# Patient Record
Sex: Female | Born: 1935 | Race: White | Hispanic: No | Marital: Single | Smoking: Never smoker
Health system: Southern US, Community
[De-identification: ages and names within clinical notes are randomized; demographics above are authoritative.]

## PROBLEM LIST (undated history)

## (undated) DIAGNOSIS — M6740 Ganglion, unspecified site: Secondary | ICD-10-CM

## (undated) DIAGNOSIS — I519 Heart disease, unspecified: Secondary | ICD-10-CM

## (undated) DIAGNOSIS — M8580 Other specified disorders of bone density and structure, unspecified site: Secondary | ICD-10-CM

## (undated) DIAGNOSIS — N390 Urinary tract infection, site not specified: Secondary | ICD-10-CM

## (undated) DIAGNOSIS — E039 Hypothyroidism, unspecified: Secondary | ICD-10-CM

## (undated) DIAGNOSIS — I10 Essential (primary) hypertension: Secondary | ICD-10-CM

## (undated) DIAGNOSIS — M810 Age-related osteoporosis without current pathological fracture: Secondary | ICD-10-CM

## (undated) DIAGNOSIS — I4891 Unspecified atrial fibrillation: Secondary | ICD-10-CM

## (undated) DIAGNOSIS — H409 Unspecified glaucoma: Secondary | ICD-10-CM

## (undated) DIAGNOSIS — Z9889 Other specified postprocedural states: Secondary | ICD-10-CM

## (undated) DIAGNOSIS — R112 Nausea with vomiting, unspecified: Secondary | ICD-10-CM

## (undated) DIAGNOSIS — K219 Gastro-esophageal reflux disease without esophagitis: Secondary | ICD-10-CM

## (undated) DIAGNOSIS — L03012 Cellulitis of left finger: Secondary | ICD-10-CM

## (undated) DIAGNOSIS — R0602 Shortness of breath: Secondary | ICD-10-CM

## (undated) DIAGNOSIS — Z5189 Encounter for other specified aftercare: Secondary | ICD-10-CM

## (undated) DIAGNOSIS — I499 Cardiac arrhythmia, unspecified: Secondary | ICD-10-CM

## (undated) DIAGNOSIS — T8859XA Other complications of anesthesia, initial encounter: Secondary | ICD-10-CM

## (undated) DIAGNOSIS — M790 Rheumatism, unspecified: Secondary | ICD-10-CM

## (undated) DIAGNOSIS — K449 Diaphragmatic hernia without obstruction or gangrene: Secondary | ICD-10-CM

## (undated) DIAGNOSIS — M1990 Unspecified osteoarthritis, unspecified site: Secondary | ICD-10-CM

## (undated) DIAGNOSIS — E785 Hyperlipidemia, unspecified: Secondary | ICD-10-CM

## (undated) DIAGNOSIS — T4145XA Adverse effect of unspecified anesthetic, initial encounter: Secondary | ICD-10-CM

## (undated) DIAGNOSIS — K274 Chronic or unspecified peptic ulcer, site unspecified, with hemorrhage: Secondary | ICD-10-CM

## (undated) DIAGNOSIS — K862 Cyst of pancreas: Secondary | ICD-10-CM

## (undated) DIAGNOSIS — D649 Anemia, unspecified: Secondary | ICD-10-CM

## (undated) DIAGNOSIS — D509 Iron deficiency anemia, unspecified: Secondary | ICD-10-CM

## (undated) DIAGNOSIS — F419 Anxiety disorder, unspecified: Secondary | ICD-10-CM

## (undated) DIAGNOSIS — M722 Plantar fascial fibromatosis: Secondary | ICD-10-CM

## (undated) DIAGNOSIS — K2270 Barrett's esophagus without dysplasia: Secondary | ICD-10-CM

## (undated) DIAGNOSIS — L989 Disorder of the skin and subcutaneous tissue, unspecified: Secondary | ICD-10-CM

## (undated) DIAGNOSIS — K648 Other hemorrhoids: Secondary | ICD-10-CM

## (undated) DIAGNOSIS — R5383 Other fatigue: Secondary | ICD-10-CM

## (undated) HISTORY — PX: COLONOSCOPY: SHX174

## (undated) HISTORY — DX: Chronic or unspecified peptic ulcer, site unspecified, with hemorrhage: K27.4

## (undated) HISTORY — DX: Disorder of the skin and subcutaneous tissue, unspecified: L98.9

## (undated) HISTORY — DX: Anemia, unspecified: D64.9

## (undated) HISTORY — DX: Other hemorrhoids: K64.8

## (undated) HISTORY — DX: Unspecified atrial fibrillation: I48.91

## (undated) HISTORY — DX: Shortness of breath: R06.02

## (undated) HISTORY — DX: Urinary tract infection, site not specified: N39.0

## (undated) HISTORY — DX: Unspecified osteoarthritis, unspecified site: M19.90

## (undated) HISTORY — DX: Hyperlipidemia, unspecified: E78.5

## (undated) HISTORY — DX: Cellulitis of left finger: L03.012

## (undated) HISTORY — DX: Encounter for other specified aftercare: Z51.89

## (undated) HISTORY — DX: Rheumatism, unspecified: M79.0

## (undated) HISTORY — PX: OTHER SURGICAL HISTORY: SHX169

## (undated) HISTORY — DX: Other specified disorders of bone density and structure, unspecified site: M85.80

## (undated) HISTORY — DX: Hypothyroidism, unspecified: E03.9

## (undated) HISTORY — DX: Gastro-esophageal reflux disease without esophagitis: K21.9

## (undated) HISTORY — PX: UPPER GASTROINTESTINAL ENDOSCOPY: SHX188

## (undated) HISTORY — DX: Ganglion, unspecified site: M67.40

## (undated) HISTORY — DX: Cyst of pancreas: K86.2

## (undated) HISTORY — DX: Barrett's esophagus without dysplasia: K22.70

## (undated) HISTORY — DX: Plantar fascial fibromatosis: M72.2

## (undated) HISTORY — DX: Other fatigue: R53.83

## (undated) HISTORY — DX: Unspecified glaucoma: H40.9

## (undated) HISTORY — DX: Essential (primary) hypertension: I10

## (undated) HISTORY — DX: Anxiety disorder, unspecified: F41.9

## (undated) HISTORY — DX: Diaphragmatic hernia without obstruction or gangrene: K44.9

## (undated) HISTORY — DX: Heart disease, unspecified: I51.9

## (undated) HISTORY — PX: CATARACT EXTRACTION: SUR2

## (undated) HISTORY — DX: Iron deficiency anemia, unspecified: D50.9

## (undated) HISTORY — DX: Age-related osteoporosis without current pathological fracture: M81.0

---
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
## 1982-02-22 HISTORY — PX: ABDOMINAL HYSTERECTOMY: SHX81

## 2005-02-22 HISTORY — PX: APPENDECTOMY: SHX54

## 2005-02-22 HISTORY — PX: CHOLECYSTECTOMY: SHX55

## 2008-10-19 ENCOUNTER — Emergency Department (HOSPITAL_COMMUNITY): Admission: EM | Admit: 2008-10-19 | Discharge: 2008-10-19 | Payer: Self-pay | Admitting: Emergency Medicine

## 2010-05-30 LAB — URINALYSIS, ROUTINE W REFLEX MICROSCOPIC
Bilirubin Urine: NEGATIVE
Hgb urine dipstick: NEGATIVE
Specific Gravity, Urine: 1.01 (ref 1.005–1.030)
pH: 6.5 (ref 5.0–8.0)

## 2010-05-30 LAB — CBC
HCT: 40.1 % (ref 36.0–46.0)
Hemoglobin: 14 g/dL (ref 12.0–15.0)
MCHC: 34.8 g/dL (ref 30.0–36.0)
MCV: 92.6 fL (ref 78.0–100.0)
RBC: 4.32 MIL/uL (ref 3.87–5.11)

## 2010-05-30 LAB — DIFFERENTIAL
Eosinophils Absolute: 0.1 10*3/uL (ref 0.0–0.7)
Lymphs Abs: 1.2 10*3/uL (ref 0.7–4.0)
Neutrophils Relative %: 69 % (ref 43–77)

## 2010-05-30 LAB — COMPREHENSIVE METABOLIC PANEL
ALT: 21 U/L (ref 0–35)
CO2: 30 mEq/L (ref 19–32)
Calcium: 9.2 mg/dL (ref 8.4–10.5)
Creatinine, Ser: 0.68 mg/dL (ref 0.4–1.2)
GFR calc non Af Amer: >60 mL/min (ref >60)
Glucose, Bld: 75 mg/dL (ref 70–99)
Sodium: 137 mEq/L (ref 135–145)

## 2010-05-30 LAB — LIPASE, BLOOD: Lipase: 25 U/L (ref 11–59)

## 2014-05-08 ENCOUNTER — Encounter: Payer: Self-pay | Admitting: Gastroenterology

## 2014-05-14 ENCOUNTER — Telehealth: Payer: Self-pay | Admitting: *Deleted

## 2014-05-14 ENCOUNTER — Encounter: Payer: Self-pay | Admitting: Gastroenterology

## 2014-05-14 ENCOUNTER — Ambulatory Visit (INDEPENDENT_AMBULATORY_CARE_PROVIDER_SITE_OTHER): Payer: Medicare Other | Admitting: Gastroenterology

## 2014-05-14 VITALS — BP 110/76 | HR 70 | Resp 16 | Ht 63.58 in | Wt 126.6 lb

## 2014-05-14 DIAGNOSIS — D509 Iron deficiency anemia, unspecified: Secondary | ICD-10-CM | POA: Diagnosis not present

## 2014-05-14 MED ORDER — MOVIPREP 100 G PO SOLR: 1.0000 | Freq: Once | ORAL | Status: DC

## 2014-05-14 NOTE — Telephone Encounter (Signed)
   05/14/2014   RE: Megan Mercer DOB: June 06, 1935 MRN: [id number]   Dear Dr. Gibson Ramp,    We have scheduled the above patient for an endoscopic procedure. Our records show that she is on anticoagulation therapy.   Please advise if patient come off her therapy of Eliquis for 2 days prior to the procedure, which is scheduled for 07-09-14.  Please fax back/ or route the completed form to [facility] at [phone].   Sincerely,    Gerlean Ren

## 2014-05-14 NOTE — Telephone Encounter (Signed)
Faxed Letter over to [phone]

## 2014-05-14 NOTE — Progress Notes (Addendum)
05/14/2014 Megan Mercer [id number] 03-28-1935   HISTORY OF PRESENT ILLNESS:  This is a 79 year old female who was referred here by her PCP, Dr. Harmon Pier, and heme/onc for evaluation regarding iron deficiency anemia.  She is from [place], [place] and used to follow with GI there, Dr. West Carbo, however, he is now retired so she was sent here.  Most recent Hgb was 10.0 grams.  MCV low at 74.  Iron low at 25 and ferritin low at 5.  Vitamin B12 is normal at 559 and folate is > 20.  She actually had a negative IFOBT test and three negative hemoccult cards.  She was placed on iron supplements BID by her PCP last week but has only been taking it once a day because she has underlying constipation anyway and did not want to make it worse acutely because she is traveling out of the country soon.  She denies ever seeing blood in her stools.  Denies any dark/black stools; says that they are slightly darker now that she is on the iron, but nothing previous to that.  She denies any diarrhea and says that she actually has constipation, sometimes only having a BM once a week.  She says that she tried Miralax previously but was taking it 3 times a day at the recommendation of a friend and that caused her to have too many loose stools.  She is on Eliquis for PAF and is followed by cardiologist, Dr. Carrolyn Leigh in [place], [place].    Her last colonoscopy was in 12/2006 at which time the study was essentially normal, however, biopsies were obtained to rule out microscopic colitis and showed mild non-specific chronic inflammation.  Repeat colonoscopy was recommended in 10 years from that time.    Her last EGD was in 10/2011 at which time she was found to have a small hiatal hernia with erosions and possible short-segment Barrett's, antral gastritis, and redness in the second portion of the duodenum.  Gastric biopsies showed chronic gastropathy, duodenal biopsies showed mucosa with mild focal villous blunting.  Also had  biopsies of the distal esophagus that showed "benign fragments of mildly chronically inflamed gastric mucosa".    EGD in 10/2008 showed possible short segment Barrett's, diffuse gastritis, and patchy redness in the second portion of the duodenum.  Esophageal biopsies at that time showed Barrett's esophagus without dysplasia, gastric biopsies showed chronic gastropathy with minimal inflammation, and duodenal biopsies showed mild chronic duodenitis.   Past Medical History  Diagnosis Date  . Arthritis   . Atrial fibrillation   . Barrett's esophagus   . GERD (gastroesophageal reflux disease)   . HLD (hyperlipidemia)   . HTN (hypertension)   . Hypothyroidism   . UTI (urinary tract infection)   . Peptic ulcer disease with hemorrhage    Past Surgical History  Procedure Laterality Date  . Appendectomy  2007  . Cholecystectomy  2007  . Abdominal hysterectomy  1984    reports that she has never smoked. She has never used smokeless tobacco. She reports that she drinks about 0.6 oz of alcohol per week. She reports that she does not use illicit drugs. family history includes Cancer in her father; Diabetes in her sister; Heart failure in her sister; Hyperlipidemia in her sister; Rheum arthritis in her father, mother, and sister. Allergies  Allergen Reactions  . Codeine Nausea And Vomiting  . Sulfa Antibiotics Nausea And Vomiting      Outpatient Encounter Prescriptions as of 05/14/2014  Medication Sig  . apixaban (ELIQUIS) 5 MG TABS tablet Take 5 mg by mouth 2 (two) times daily.  . Calcium Carbonate 1500 (600 CA) MG TABS Take 1 tablet by mouth as needed.  . colestipol (COLESTID) 1 G tablet Take 1 g by mouth 2 (two) times daily.  Marland Kitchen diltiazem (TIAZAC) 240 MG 24 hr capsule Take 240 mg by mouth daily.  Marland Kitchen esomeprazole (NEXIUM) 40 MG capsule Take 40 mg by mouth daily.  Marland Kitchen ezetimibe (ZETIA) 10 MG tablet Take 10 mg by mouth daily.  Marland Kitchen FOLIC ACID PO Take 038 mg by mouth as needed.  Marland Kitchen levothyroxine  (SYNTHROID, LEVOTHROID) 75 MCG tablet Take 75 mcg by mouth daily before breakfast.  . propafenone (RYTHMOL) 225 MG tablet Take 225 mg by mouth 2 (two) times daily.  . raloxifene (EVISTA) 60 MG tablet Take 60 mg by mouth daily.  . ranitidine (ZANTAC) 300 MG tablet Take 300 mg by mouth at bedtime.  . travoprost, benzalkonium, (TRAVATAN) 0.004 % ophthalmic solution Place 1 drop into both eyes at bedtime.  Marland Kitchen VITAMIN D, CHOLECALCIFEROL, PO Take 1,200 mg by mouth as needed.  Marland Kitchen MOVIPREP 100 G SOLR Take 1 kit (200 g total) by mouth once.     REVIEW OF SYSTEMS  : All other systems reviewed and negative except where noted in the History of Present Illness.   PHYSICAL EXAM: BP 110/76 mmHg  Pulse 70  Resp 16  Ht 5' 3.58" (1.615 m)  Wt 126 lb 9.6 oz (57.425 kg)  BMI 22.02 kg/m2 General: Well developed white female in no acute distress Head: Normocephalic and atraumatic Eyes: Sclerae anicteric, conjunctiva pink. Ears: Normal auditory acuity Lungs: Clear throughout to auscultation Heart: Regular rate and rhythm Abdomen: Soft, non-distended.  Normal bowel sounds.  Mild epigastric TTP without R/R/G. Rectal:  Will be done at the time of colonoscopy.  IFOBT negative. Musculoskeletal: Symmetrical with no gross deformities  Skin: No lesions on visible extremities Extremities: No edema  Neurological: Alert oriented x 4, grossly non-focal Psychological:  Alert and cooperative. Normal mood and affect  ASSESSMENT AND PLAN: -Iron deficiency anemia:  Hgb 10.0 grams most recently with low MCV.  IFOBT negative and hemoccult cards negative x 3.  Last colonoscopy was 12/2006 and last EGD 10/2011.  Has history of Barrett's from biopsies on EGD in 2010.  Will schedule EGD and colonoscopy with Dr. Hilarie Fredrickson.  Will give two day bowel prep due to underlying constipation issues.  Should have repeat duodenal biopsies for celiac and may need celiac labs performed as well.  She will continue her iron supplements per  recommendations from heme/onc.   -Chronic anticoagulation, on Eliquis for atrial fibrillation -Constipation:  Will try Miralax once or twice daily.  *Will hold Eliquis for 2 days prior to endoscopic procedures - will instruct when and how to resume after procedure. Benefits and risks of procedure explained including risks of bleeding, perforation, infection, missed lesions, reactions to medications and possible need for hospitalization and surgery for complications. Additional rare but real risk of stroke or other vascular clotting events off of Eliquis also explained and need to seek urgent help if any signs of these problems occur. Will communicate by phone or EMR with patient's prescribing provider, Dr. Carrolyn Leigh, cardiology to confirm that holding Eliquis is reasonable in this case.   CC:  Dr. Harmon Pier  Addendum: Reviewed and agree with initial management. Jerene Bears, MD

## 2014-05-14 NOTE — Patient Instructions (Signed)
You have been scheduled for an endoscopy and colonoscopy. Please follow the written instructions given to you at your visit today. Please pick up your prep supplies at the pharmacy within the next 1-3 days. If you use inhalers (even only as needed), please bring them with you on the day of your procedure. Your physician has requested that you go to www.[email or web address].com and enter the access code given to you at your visit today. This web site gives a general overview about your procedure. However, you should still follow specific instructions given to you by our office regarding your preparation for the procedure.  Please purchase the following medications over the counter and take as directed: Miralax once or twice daily

## 2014-05-20 NOTE — Telephone Encounter (Signed)
I have spoken to patient to advise that per Dr Real Cons faxed response, she may hold her Eliquis 2 days prior to her scheduled procedure on 07/09/14. She verbalizes understanding.

## 2014-07-09 ENCOUNTER — Other Ambulatory Visit: Payer: Self-pay | Admitting: Internal Medicine

## 2014-07-09 ENCOUNTER — Encounter: Payer: Self-pay | Admitting: Internal Medicine

## 2014-07-09 ENCOUNTER — Ambulatory Visit (AMBULATORY_SURGERY_CENTER): Payer: Medicare Other | Admitting: Internal Medicine

## 2014-07-09 VITALS — BP 135/67 | HR 62 | Temp 97.3°F | Resp 34 | Ht 63.0 in | Wt 126.0 lb

## 2014-07-09 DIAGNOSIS — D509 Iron deficiency anemia, unspecified: Secondary | ICD-10-CM | POA: Diagnosis not present

## 2014-07-09 DIAGNOSIS — Z8719 Personal history of other diseases of the digestive system: Secondary | ICD-10-CM

## 2014-07-09 MED ORDER — SODIUM CHLORIDE 0.9 % IV SOLN: 500.0000 mL | INTRAVENOUS | Status: DC

## 2014-07-09 MED ORDER — LUBIPROSTONE 8 MCG PO CAPS: 8.0000 ug | ORAL_CAPSULE | Freq: Two times a day (BID) | ORAL | Status: DC

## 2014-07-09 NOTE — Progress Notes (Signed)
To recovery, report to McCoy, RN, VSS 

## 2014-07-09 NOTE — Op Note (Signed)
[place]  [address]. [place], [address]   ENDOSCOPY PROCEDURE REPORT  PATIENT: Megan Mercer, Megan Mercer  MR#: [id number] BIRTHDATE: Jan 05, 1936 , 79  yrs. old GENDER: female ENDOSCOPIST: Jerene Bears, MD REFERRED BY:  Clinton Quant, M.D. PROCEDURE DATE:  07/09/2014 PROCEDURE:  EGD w/ biopsy ASA CLASS:     Class III INDICATIONS:  iron deficiency anemia and history of Barrett's esophagus. MEDICATIONS: Monitored anesthesia care and Propofol 150 mg IV TOPICAL ANESTHETIC: none  DESCRIPTION OF PROCEDURE: After the risks benefits and alternatives of the procedure were thoroughly explained, informed consent was obtained.  The LB [id number] endoscope was introduced through the mouth and advanced to the second portion of the duodenum , Without limitations.  The instrument was slowly withdrawn as the mucosa was fully examined.  ESOPHAGUS: There was possible short segment Barrett's esophagus found at the gastroesophageal junction (1 broad based tongue measuring 1 cm above the JGEJ).  There was no nodular mucosa noted in the Barrett's segment.  Multiple biopsies were performed using cold forceps.   Otherwise normal esophagus.  STOMACH: Small hiatal hernia with no Cameron's lesions. The mucosa of the stomach appeared normal.  DUODENUM: The duodenal mucosa showed no abnormalities in the bulb and 2nd part of the duodenum.  Cold forceps biopsies were taken in the bulb and second portion.  Retroflexed views revealed as above hiatal hernia.   The scope was then withdrawn from the patient and the procedure completed.  COMPLICATIONS: There were no immediate complications.  ENDOSCOPIC IMPRESSION: 1.   There was possible short segment Barrett's esophagus found at the gastroesophageal junction; multiple biopsies were performed. Small hiatal hernia 2.   The mucosa of the stomach appeared normal 3.   The duodenal mucosa showed no abnormalities in the bulb and  2nd part of the duodenum cold forceps biopsies were taken in the bulb and second portion  RECOMMENDATIONS: 1.  Await biopsy results 2.  Proceed with a Colonoscopy eSigned:  Jerene Bears, MD 07/09/2014 11:22 AM CC: the patient, Clinton Quant, M.D.

## 2014-07-09 NOTE — Op Note (Signed)
[place]  [address]. [place], [address]   COLONOSCOPY PROCEDURE REPORT  PATIENT: Megan Mercer, Megan Mercer  MR#: [id number] BIRTHDATE: Jan 08, 1936 , 79  yrs. old GENDER: female ENDOSCOPIST: Jerene Bears, MD REFERRED XB:JYNWGN Helyn App, M.D. PROCEDURE DATE:  07/09/2014 PROCEDURE:   Colonoscopy, diagnostic First Screening Colonoscopy - Avg.  risk and is 50 yrs.  old or older - No.  Prior Negative Screening - Now for repeat screening. N/A  History of Adenoma - Now for follow-up colonoscopy & has been > or = to 3 yrs.  N/A  Polyps removed today? No Recommend repeat exam, <10 yrs? No ASA CLASS:   Class III INDICATIONS:Unexplained iron deficiency anemia. MEDICATIONS: Monitored anesthesia care, Propofol 110 mg IV, and Residual sedation present  DESCRIPTION OF PROCEDURE:   After the risks benefits and alternatives of the procedure were thoroughly explained, informed consent was obtained.  The digital rectal exam revealed no abnormalities of the rectum.   The LB [id number]  endoscope was introduced through the anus and advanced to the cecum, which was identified by both the appendix and ileocecal valve. No adverse events experienced.   The quality of the prep was good, using MoviPrep  The instrument was then slowly withdrawn as the colon was fully examined.      COLON FINDINGS: A normal appearing cecum, ileocecal valve, and appendiceal orifice were identified.  the ascending, transverse, descending, sigmoid colon, and rectum appeared unremarkable. Retroflexed views revealed no abnormalities. The time to cecum = 3.9 Withdrawal time = 11.6   The scope was withdrawn and the procedure completed.  COMPLICATIONS: There were no complications.  ENDOSCOPIC IMPRESSION: Normal colonoscopy with no source found to explain iron deficiency anemia  RECOMMENDATIONS: 1.  Would recommend trial of Amitiza 8 mcg twice daily for chronic constipation 2.  Arrange video  capsule endoscopy to complete evaluation of iron deficiency anemia 3.  Would recommend IV iron with previously established hematologist   eSigned:  Jerene Bears, MD 07/09/2014 11:27 AM   cc: the patient, Clinton Quant, M.D.

## 2014-07-09 NOTE — Progress Notes (Signed)
Called to room to assist during endoscopic procedure.  Patient ID and intended procedure confirmed with present staff. Received instructions for my participation in the procedure from the performing physician.  

## 2014-07-09 NOTE — Patient Instructions (Addendum)
Discharge instructions given. Biopsies taken. Prescription sent into pharmacy. Office will arrange for capsule endoscopy. YOU HAD AN ENDOSCOPIC PROCEDURE TODAY AT [facility]:   Refer to the procedure report that was given to you for any specific questions about what was found during the examination.  If the procedure report does not answer your questions, please call your gastroenterologist to clarify.  If you requested that your care partner not be given the details of your procedure findings, then the procedure report has been included in a sealed envelope for you to review at your convenience later.  YOU SHOULD EXPECT: Some feelings of bloating in the abdomen. Passage of more gas than usual.  Walking can help get rid of the air that was put into your GI tract during the procedure and reduce the bloating. If you had a lower endoscopy (such as a colonoscopy or flexible sigmoidoscopy) you may notice spotting of blood in your stool or on the toilet paper. If you underwent a bowel prep for your procedure, you may not have a normal bowel movement for a few days.  Please Note:  You might notice some irritation and congestion in your nose or some drainage.  This is from the oxygen used during your procedure.  There is no need for concern and it should clear up in a day or so.  SYMPTOMS TO REPORT IMMEDIATELY:   Following lower endoscopy (colonoscopy or flexible sigmoidoscopy):  Excessive amounts of blood in the stool  Significant tenderness or worsening of abdominal pains  Swelling of the abdomen that is new, acute  Fever of 100F or higher   Following upper endoscopy (EGD)  Vomiting of blood or coffee ground material  New chest pain or pain under the shoulder blades  Painful or persistently difficult swallowing  New shortness of breath  Fever of 100F or higher  Black, tarry-looking stools  For urgent or emergent issues, a gastroenterologist can be reached at any hour by  calling [phone].   DIET: Your first meal following the procedure should be a small meal and then it is ok to progress to your normal diet. Heavy or fried foods are harder to digest and may make you feel nauseous or bloated.  Likewise, meals heavy in dairy and vegetables can increase bloating.  Drink plenty of fluids but you should avoid alcoholic beverages for 24 hours.  ACTIVITY:  You should plan to take it easy for the rest of today and you should NOT DRIVE or use heavy machinery until tomorrow (because of the sedation medicines used during the test).    FOLLOW UP: Our staff will call the number listed on your records the next business day following your procedure to check on you and address any questions or concerns that you may have regarding the information given to you following your procedure. If we do not reach you, we will leave a message.  However, if you are feeling well and you are not experiencing any problems, there is no need to return our call.  We will assume that you have returned to your regular daily activities without incident.  If any biopsies were taken you will be contacted by phone or by letter within the next 1-3 weeks.  Please call us at [phone] if you have not heard about the biopsies in 3 weeks.    SIGNATURES/CONFIDENTIALITY: You and/or your care partner have signed paperwork which will be entered into your electronic medical record.  These signatures attest to  the fact that that the information above on your After Visit Summary has been reviewed and is understood.  Full responsibility of the confidentiality of this discharge information lies with you and/or your care-partner.

## 2014-07-10 ENCOUNTER — Telehealth: Payer: Self-pay | Admitting: *Deleted

## 2014-07-10 ENCOUNTER — Other Ambulatory Visit: Payer: Self-pay

## 2014-07-10 DIAGNOSIS — D509 Iron deficiency anemia, unspecified: Secondary | ICD-10-CM

## 2014-07-10 NOTE — Telephone Encounter (Signed)
  Follow up Call-  Call back number 07/09/2014  Post procedure Call Back phone  # [phone]  Permission to leave phone message Yes     Patient questions:  Do you have a fever, pain , or abdominal swelling? No. Pain Score  0 *  Have you tolerated food without any problems? Yes.    Have you been able to return to your normal activities? Yes.    Do you have any questions about your discharge instructions: Diet   No. Medications  No. Follow up visit  No.  Do you have questions or concerns about your Care? No.  Actions: * If pain score is 4 or above: No action needed, pain <4.

## 2014-07-16 ENCOUNTER — Telehealth: Payer: Self-pay | Admitting: Internal Medicine

## 2014-07-16 ENCOUNTER — Encounter: Payer: Self-pay | Admitting: Internal Medicine

## 2014-07-16 NOTE — Telephone Encounter (Signed)
No Barrett's esophagus from esophageal biopsies Small bowel normal, no celiac disease or increased inflammation Would proceed with small bowel video capsule endoscopy to complete workup of iron deficiency anemia Letter sent

## 2014-07-16 NOTE — Telephone Encounter (Signed)
Pt calling for path results, please advise

## 2014-07-18 ENCOUNTER — Telehealth: Payer: Self-pay | Admitting: Internal Medicine

## 2014-07-18 NOTE — Telephone Encounter (Signed)
Reviewed schedule with pt and after looking at dates she has decided to call us back later in July to schedule VCE.

## 2014-07-18 NOTE — Telephone Encounter (Signed)
Left message for pt to call back  °

## 2014-07-18 NOTE — Telephone Encounter (Signed)
Spoke with pt and she is aware of results. Pt is willing to have VCE done and would like to call back the middle of June after some graduations and a beach trip to schedule. States she has decided to schedule the iron infusions with her hematologist. Dr. Hilarie Fredrickson aware.

## 2014-07-29 ENCOUNTER — Telehealth: Payer: Self-pay | Admitting: Internal Medicine

## 2014-07-31 NOTE — Telephone Encounter (Signed)
Pt scheduled for capsule teaching 08/20/14@2pm . Scheduled for Capsule endo 09/24/14@8am , pt aware.

## 2014-08-15 ENCOUNTER — Telehealth: Payer: Self-pay | Admitting: Internal Medicine

## 2014-08-15 NOTE — Telephone Encounter (Signed)
Appt rescheduled to 09/17/14@8am , pt aware.

## 2014-08-22 ENCOUNTER — Telehealth: Payer: Self-pay | Admitting: Internal Medicine

## 2014-08-22 NOTE — Telephone Encounter (Signed)
Pts capsule endo moved to 11/18/14, pt aware.

## 2014-11-15 ENCOUNTER — Telehealth: Payer: Self-pay | Admitting: Internal Medicine

## 2014-11-15 NOTE — Telephone Encounter (Signed)
Beth spoke to Dr. Hilarie Fredrickson and patient does not have to hold her Eliquis. Patient notified of the recommendation and has no question or concerns at this time.

## 2014-11-18 ENCOUNTER — Telehealth: Payer: Self-pay

## 2014-11-18 ENCOUNTER — Ambulatory Visit (INDEPENDENT_AMBULATORY_CARE_PROVIDER_SITE_OTHER): Payer: Medicare Other | Admitting: Internal Medicine

## 2014-11-18 DIAGNOSIS — D509 Iron deficiency anemia, unspecified: Secondary | ICD-10-CM

## 2014-11-18 NOTE — Progress Notes (Signed)
Patient here for capsule endoscopy. Tolerated procedure. Verbalizes understanding of written and verbal instructions.  Capsule ID # [id number]  Lot [id number] Exp [id number] Lot [id number]

## 2014-11-18 NOTE — Telephone Encounter (Signed)
1) Can she take the Amitiza BID PRN or should it be taken regularly? 2) She has a long standing issue with fecal incontinence at times. This is even when she has had difficult bowel movements earlier in the day. She cannot pass gas with confidence. Any idea on the cause/cure for her incontinence? 3) She is without reflux or indigestion symptoms. Can she take Nexium PRN?

## 2014-11-19 NOTE — Telephone Encounter (Signed)
Amitiza is usually indicated for regular use but I'm okay if she uses this as needed Fecal incontinence is likely due to pelvic floor dysfunction, there is usually no easy cure. Pelvic floor physical therapy sometimes helps and can be considered if she is interested nexium is not effective as needed. She can use ranitidine 150 every 12 hours when necessary for breakthrough heartburn.  If this is the plan Nexium can be discontinued

## 2014-11-19 NOTE — Telephone Encounter (Signed)
Left message for patient to call back  

## 2014-11-19 NOTE — Telephone Encounter (Signed)
Patient notified of recommendations  She will call back if she wants to pursue physical therapy and pelvic floor training

## 2014-11-29 ENCOUNTER — Telehealth: Payer: Self-pay

## 2014-11-29 ENCOUNTER — Telehealth: Payer: Self-pay | Admitting: Internal Medicine

## 2014-11-29 DIAGNOSIS — T189XXA Foreign body of alimentary tract, part unspecified, initial encounter: Secondary | ICD-10-CM

## 2014-11-29 NOTE — Telephone Encounter (Signed)
-----   Message from Alfredia Ferguson, PA-C sent at 11/27/2014  5:48 PM EDT ----- Regarding: capsule Capsule ready for review   Vaughan Basta- study was incomplete- please call pt and if hasnt passed the capsule check a KUB

## 2014-11-29 NOTE — Telephone Encounter (Signed)
What did the part you did read show?

## 2014-11-29 NOTE — Telephone Encounter (Signed)
Left message for patient to call back  

## 2014-11-29 NOTE — Telephone Encounter (Signed)
Patient is not sure that she saw it.  She will come for KUB one day next week.   Orders placed.  She is notified that the x-ray department is closed until 12/04/14.  She is offered to go to [facility] or [facility].  She prefers to wait until x-ray here is running again.

## 2014-11-29 NOTE — Telephone Encounter (Signed)
Incomplete, needs KUB if didn't pass capsule... Multiple scattered  erosions in small bowel

## 2014-12-03 NOTE — Telephone Encounter (Signed)
Patient aware of results .  She will come one day this week for KUB

## 2014-12-09 ENCOUNTER — Encounter: Payer: Self-pay | Admitting: Internal Medicine

## 2014-12-09 ENCOUNTER — Telehealth: Payer: Self-pay | Admitting: Internal Medicine

## 2014-12-09 NOTE — Telephone Encounter (Signed)
Left message for pt to call back  °

## 2014-12-11 ENCOUNTER — Ambulatory Visit (INDEPENDENT_AMBULATORY_CARE_PROVIDER_SITE_OTHER)
Admission: RE | Admit: 2014-12-11 | Discharge: 2014-12-11 | Disposition: A | Discharge status: Home / Self Care | Payer: Medicare Other | Source: Ambulatory Visit | Attending: Physician Assistant | Admitting: Physician Assistant

## 2014-12-11 DIAGNOSIS — T189XXA Foreign body of alimentary tract, part unspecified, initial encounter: Secondary | ICD-10-CM

## 2014-12-11 NOTE — Telephone Encounter (Signed)
Spoke with pt and she is aware of capsule results.

## 2015-01-23 ENCOUNTER — Encounter: Payer: Self-pay | Admitting: *Deleted

## 2015-01-27 ENCOUNTER — Telehealth: Payer: Self-pay | Admitting: Internal Medicine

## 2015-01-27 NOTE — Telephone Encounter (Signed)
Explained to pt that the OV was a followup from her capsule endo. Pt states she sees her hematologist in early Jan and he is monitoring her labs. Pt states she will call our office back if she needs to be seen. Reports mailed to pt per her request.

## 2015-02-05 ENCOUNTER — Ambulatory Visit: Payer: Medicare Other | Admitting: Internal Medicine

## 2015-08-19 ENCOUNTER — Encounter: Payer: Self-pay | Admitting: Physician Assistant

## 2015-08-19 ENCOUNTER — Ambulatory Visit (INDEPENDENT_AMBULATORY_CARE_PROVIDER_SITE_OTHER): Payer: Medicare Other | Admitting: Physician Assistant

## 2015-08-19 ENCOUNTER — Other Ambulatory Visit (INDEPENDENT_AMBULATORY_CARE_PROVIDER_SITE_OTHER): Payer: Medicare Other

## 2015-08-19 ENCOUNTER — Telehealth: Payer: Self-pay | Admitting: *Deleted

## 2015-08-19 VITALS — BP 150/76 | HR 76 | Ht 63.0 in | Wt 123.4 lb

## 2015-08-19 DIAGNOSIS — R1013 Epigastric pain: Secondary | ICD-10-CM | POA: Diagnosis not present

## 2015-08-19 DIAGNOSIS — R634 Abnormal weight loss: Secondary | ICD-10-CM

## 2015-08-19 LAB — COMPREHENSIVE METABOLIC PANEL
ALBUMIN: 4.5 g/dL (ref 3.5–5.2)
ALT: 18 U/L (ref 0–35)
AST: 18 U/L (ref 0–37)
Alkaline Phosphatase: 67 U/L (ref 39–117)
BILIRUBIN TOTAL: 0.8 mg/dL (ref 0.2–1.2)
BUN: 11 mg/dL (ref 6–23)
CALCIUM: 9.5 mg/dL (ref 8.4–10.5)
CO2: 30 mEq/L (ref 19–32)
CREATININE: 0.72 mg/dL (ref 0.40–1.20)
Chloride: 97 mEq/L (ref 96–112)
GFR: 83.21 mL/min (ref >60.00)
Glucose, Bld: 91 mg/dL (ref 70–99)
Potassium: 4.1 mEq/L (ref 3.5–5.1)
Sodium: 132 mEq/L — ABNORMAL LOW (ref 135–145)
Total Protein: 7.5 g/dL (ref 6.0–8.3)

## 2015-08-19 LAB — CBC WITH DIFFERENTIAL/PLATELET
BASOS ABS: 0.1 10*3/uL (ref 0.0–0.1)
Basophils Relative: 1 % (ref 0.0–3.0)
Eosinophils Absolute: 0.1 10*3/uL (ref 0.0–0.7)
Eosinophils Relative: 1 % (ref 0.0–5.0)
HEMATOCRIT: 42.3 % (ref 36.0–46.0)
HEMOGLOBIN: 14.4 g/dL (ref 12.0–15.0)
LYMPHS PCT: 15.5 % (ref 12.0–46.0)
Lymphs Abs: 1.5 10*3/uL (ref 0.7–4.0)
MCHC: 34 g/dL (ref 30.0–36.0)
MCV: 93.4 fl (ref 78.0–100.0)
MONOS PCT: 8.5 % (ref 3.0–12.0)
Monocytes Absolute: 0.8 10*3/uL (ref 0.1–1.0)
Neutro Abs: 7.2 10*3/uL (ref 1.4–7.7)
Neutrophils Relative %: 74 % (ref 43.0–77.0)
Platelets: 519 10*3/uL — ABNORMAL HIGH (ref 150.0–400.0)
RBC: 4.53 Mil/uL (ref 3.87–5.11)
RDW: 14 % (ref 11.5–15.5)
WBC: 9.7 10*3/uL (ref 4.0–10.5)

## 2015-08-19 LAB — LIPASE: Lipase: 23 U/L (ref 11.0–59.0)

## 2015-08-19 MED ORDER — SUCRALFATE 1 GM/10ML PO SUSP: 1.0000 g | Freq: Three times a day (TID) | ORAL | Status: DC

## 2015-08-19 MED ORDER — RANITIDINE HCL 300 MG PO TABS: 300.0000 mg | ORAL_TABLET | Freq: Every day | ORAL | Status: DC

## 2015-08-19 NOTE — Progress Notes (Addendum)
Patient ID: Megan Mercer, female   DOB: May 13, 1935, 80 y.o.   MRN: [id number]   Subjective:    Patient ID: Megan Mercer, female    DOB: November 06, 1935, 80 y.o.   MRN: [id number]  HPI Megan Mercer is a pleasant 80 year old white female known to Dr. Hilarie Fredrickson from workup about a year ago for iron deficiency anemia. She comes in today with complaints of epigastric pain and weight loss. Patient does have history of osteoarthritis, atrial fibrillation for which she is on eliquis, hypertension hyperlipidemia and is status post cholecystectomy and appendectomy. Patient relates having an episode of severe epigastric and subxiphoid pain and pressure while she was away at the beach a couple of weeks ago. Her pain persisted and she developed very rapid heart rate. She was seen at an urgent care and then taken to [facility] at [facility]. She was admitted. She says she after a GI cocktail her pain persisted and even after morphine it persisted but gradually abated over several hours. She was seen by cardiology there and felt to have atrial fib flutter. Nuclear stress test was recommended and out was completed prior to discharge she was told this was negative. She is followed by a cardiologist in [place]. She has since been referred for possible ablation and is still thinking about that.  She hasn't had any episodes of tachycardia in the past week or so. He has been having ongoing GI symptoms however with intermittent subxiphoid and epigastric pain and pressure on a daily basis. She says this really doesn't ever go completely away. Not had any nausea or vomiting. Denies any dysphagia or odynophagia. She is not clear about whether eating seems to affect her symptoms are not but hasn't been eating very much and feels that her weight is down about 10 pounds over the past several weeks. She has been taking Nexium 40 mg by mouth every morning and a Zantac at bedtime. She denies any aspirin or NSAID  use. Workup in May 2016 with endoscopy and colonoscopy revealed a normal colon and in and no with possible Barrett's however biopsies were negative no evidence for gastritis etc. and small bowel biopsies also negative. She had subsequent capsule endoscopy in October 2016 with finding of a few scattered nonspecific erosions. She is anxious because of her recent cardiac issues.  Review of Systems Pertinent positive and negative review of systems were noted in the above HPI section.  All other review of systems was otherwise negative.  Outpatient Encounter Prescriptions as of 08/19/2015  Medication Sig  . apixaban (ELIQUIS) 5 MG TABS tablet Take 5 mg by mouth 2 (two) times daily.  . Calcium Carbonate 1500 (600 CA) MG TABS Take 1 tablet by mouth as needed.  . Cholecalciferol (VITAMIN D3) 1000 UNITS CAPS Take by mouth.  . colestipol (COLESTID) 1 G tablet Take 1 g by mouth 2 (two) times daily.  Marland Kitchen diltiazem (TIAZAC) 240 MG 24 hr capsule Take 240 mg by mouth daily.  Marland Kitchen esomeprazole (NEXIUM) 40 MG capsule Take 40 mg by mouth daily.  Marland Kitchen ezetimibe (ZETIA) 10 MG tablet Take 10 mg by mouth daily.  . ferrous sulfate 325 (65 FE) MG tablet Take 325 mg by mouth 2 (two) times daily with a meal.  . FOLIC ACID PO Take 0000000 mg by mouth as needed.  . latanoprost (XALATAN) 0.005 % ophthalmic solution   . levothyroxine (SYNTHROID, LEVOTHROID) 75 MCG tablet Take 75 mcg by mouth daily before breakfast.  . propafenone (RYTHMOL) 225  MG tablet Take 225 mg by mouth 2 (two) times daily.  . raloxifene (EVISTA) 60 MG tablet Take 60 mg by mouth daily.  . ranitidine (ZANTAC) 300 MG tablet Take 1 tablet (300 mg total) by mouth at bedtime.  . timolol (TIMOPTIC) 0.5 % ophthalmic solution   . travoprost, benzalkonium, (TRAVATAN) 0.004 % ophthalmic solution Place 1 drop into both eyes at bedtime.  . [DISCONTINUED] ranitidine (ZANTAC) 300 MG tablet Take 300 mg by mouth at bedtime.  . sucralfate (CARAFATE) 1 GM/10ML suspension Take 10  mLs (1 g total) by mouth 4 (four) times daily -  with meals and at bedtime.  . [DISCONTINUED] gabapentin (NEURONTIN) 100 MG capsule   . [DISCONTINUED] lubiprostone (AMITIZA) 8 MCG capsule Take 1 capsule (8 mcg total) by mouth 2 (two) times daily with a meal.   No facility-administered encounter medications on file as of 08/19/2015.   Allergies  Allergen Reactions  . Codeine Nausea And Vomiting  . Oxycodone-Acetaminophen Nausea And Vomiting  . Sulfa Antibiotics Nausea And Vomiting   Patient Active Problem List   Diagnosis Date Noted  . Anemia, iron deficiency 05/14/2014   Social History   Social History  . Marital Status: Single    Spouse Name: N/A  . Number of Children: 0  . Years of Education: N/A   Occupational History  . RETIRED    Social History Main Topics  . Smoking status: Never Smoker   . Smokeless tobacco: Never Used  . Alcohol Use: 0.6 oz/week    1 Glasses of wine per week  . Drug Use: No  . Sexual Activity: Not on file   Other Topics Concern  . Not on file   Social History Narrative    Megan Mercer's family history includes Cancer in her father; Diabetes in her sister; Heart failure in her sister; Hyperlipidemia in her sister; Rheum arthritis in her father, mother, and sister.      Objective:    Filed Vitals:   08/19/15 0904  BP: 150/76  Pulse: 76    Physical Exam  well-developed elderly white female in no acute distress, pleasant blood pressure 150/76 pulse 76 height 5 foot 3 weight 123 BMI 21.8. HEENT; nontraumatic normocephalic EOMI PERRLA sclera anicteric, Cardiovascular ;regular rate and rhythm with S1-S2 no murmur or gallop, Pulmonary; laterally, Abdomen;soft, there is no palpable mass or hepatosplenomegaly she is tender in the epigastrium no guarding or rebound, Rectal ;exam not done, Extremities; no clubbing cyanosis or edema skin warm and dry, Neuropsych ;mood and affect appropriate     Assessment & Plan:   #60 80 year old female with recent  admission in [place] for what sounds like an episode of rapid atrial fib/question flutter. This episode was associated with subxiphoid pain and pressure, subsequent nuclear stress test negative Patient has had persistence of subxiphoid and epigastric discomfort since on a daily basis. This is despite taking Nexium and Zantac. She is status post cholecystectomy Etiology of her pain and subsequent weight loss is not clear-negative endoscopy one year ago-rule out gastropathy, rule out or intra-abdominal pathology or inflammatory process  #2 chronic anticoagulation-on eliquis #3 hypertension #4 hyperlipidemia #5 status post cholecystectomy and appendectomy #6 history of iron deficiency anemia complete GI workup negative as to etiology, capsule endoscopy revealing only a few scattered nonspecific erosions.  Plan; CBC with differential ,CMET and lipase Schedule for CT of the abdomen and pelvis with contrast Patient concerned about long-term side effects of PPI therapy and really does not have a history of  poorly controlled GERD We will stop Nexium and change Zantac to 300 mg by mouth twice a day for now [employer] Add Carafate liquid 1 g 3 times a day between meals Further plans pending results of above. Would like to avoid endoscopic evaluation until she undergoes ablation for recurrent rapid atrial fib/flutter, if possible.   Amy S Esterwood PA-C 08/19/2015   Cc: Pomposini, Cherly Anderson, MD  Addendum: Reviewed and agree with initial management. Jerene Bears, MD

## 2015-08-19 NOTE — Patient Instructions (Addendum)
Please go to the basement level to have your labs drawn.  Stop the Nexium. We sent prescriptions to [place], [place], [place]. 1. Zantac 300 mg 2. Carafate Liquid  We made you an appointment with Dr. Zenovia Jarred for 10-21-2015 at 2:45 PM.    You have been scheduled for a CT scan of the abdomen and pelvis at [facility] ([address]---this is in the same building as [facility]).   You are scheduled on 08-27-2015 at 3:00 PM. You should arrive at 2:45 PM  to your appointment time for registration. Please follow the written instructions below on the day of your exam:  WARNING: IF YOU ARE ALLERGIC TO IODINE/X-RAY DYE, PLEASE NOTIFY RADIOLOGY IMMEDIATELY AT [id number]! YOU WILL BE GIVEN A 13 HOUR PREMEDICATION PREP.  1) Do not eat or drink anything after 11:00 am (4 hours prior to your test) 2) You have been given 2 bottles of oral contrast to drink. The solution may taste  better if refrigerated, but do NOT add ice or any other liquid to this solution. Shake well before drinking.    Drink 1 bottle of contrast @ 1:00 PM (2 hours prior to your exam)  Drink 1 bottle of contrast @ 2:00 PM (1 hour prior to your exam)  You may take any medications as prescribed with a small amount of water except for the following: Metformin, Glucophage, Glucovance, Avandamet, Riomet, Fortamet, Actoplus Met, Janumet, Glumetza or Metaglip. The above medications must be held the day of the exam AND 48 hours after the exam.  The purpose of you drinking the oral contrast is to aid in the visualization of your intestinal tract. The contrast solution may cause some diarrhea. Before your exam is started, you will be given a small amount of fluid to drink. Depending on your individual set of symptoms, you may also receive an intravenous injection of x-ray contrast/dye. Plan on being at [facility] for 30 minutes or long, depending on the type of exam you are having performed.  If you have any  questions regarding your exam or if you need to reschedule, you may call the CT department at [phone] between the hours of 8:00 am and 5:00 pm, Monday-Friday.  ________________________________________________________________________

## 2015-08-19 NOTE — Telephone Encounter (Signed)
Advised the pharmacy that we would approve for the patient to take the Carafate to tablets. Advised the pharmacist that she can crush 1 tablet between 2 spoons and add water to make a slurry to swallow. #420 with 1 refill.  Spoke to [employer], the pharmacist.

## 2015-08-21 ENCOUNTER — Telehealth: Payer: Self-pay | Admitting: Physician Assistant

## 2015-08-21 NOTE — Telephone Encounter (Signed)
Called the patient

## 2015-08-25 ENCOUNTER — Inpatient Hospital Stay: Admission: RE | Admit: 2015-08-25 | Payer: Medicare Other | Source: Ambulatory Visit

## 2015-08-27 ENCOUNTER — Inpatient Hospital Stay: Admission: RE | Admit: 2015-08-27 | Payer: Medicare Other | Source: Ambulatory Visit

## 2015-08-29 ENCOUNTER — Ambulatory Visit (INDEPENDENT_AMBULATORY_CARE_PROVIDER_SITE_OTHER)
Admission: RE | Admit: 2015-08-29 | Discharge: 2015-08-29 | Disposition: A | Discharge status: Home / Self Care | Payer: Medicare Other | Source: Ambulatory Visit | Attending: Physician Assistant | Admitting: Physician Assistant

## 2015-08-29 DIAGNOSIS — R1013 Epigastric pain: Secondary | ICD-10-CM

## 2015-08-29 DIAGNOSIS — R634 Abnormal weight loss: Secondary | ICD-10-CM

## 2015-08-29 MED ORDER — IOPAMIDOL (ISOVUE-300) INJECTION 61%
100.0000 mL | Freq: Once | INTRAVENOUS | Status: AC | PRN
  Administered 2015-08-29: 100 mL via INTRAVENOUS

## 2015-09-24 ENCOUNTER — Ambulatory Visit: Payer: Medicare Other | Admitting: Internal Medicine

## 2015-10-21 ENCOUNTER — Encounter: Payer: Self-pay | Admitting: Internal Medicine

## 2015-10-21 ENCOUNTER — Ambulatory Visit (INDEPENDENT_AMBULATORY_CARE_PROVIDER_SITE_OTHER): Payer: Medicare Other | Admitting: Internal Medicine

## 2015-10-21 VITALS — BP 110/70 | HR 60 | Ht 63.0 in | Wt 128.2 lb

## 2015-10-21 DIAGNOSIS — R1013 Epigastric pain: Secondary | ICD-10-CM | POA: Diagnosis not present

## 2015-10-21 DIAGNOSIS — F418 Other specified anxiety disorders: Secondary | ICD-10-CM

## 2015-10-21 DIAGNOSIS — I499 Cardiac arrhythmia, unspecified: Secondary | ICD-10-CM

## 2015-10-21 DIAGNOSIS — F064 Anxiety disorder due to known physiological condition: Secondary | ICD-10-CM

## 2015-10-21 MED ORDER — CILIDINIUM-CHLORDIAZEPOXIDE 2.5-5 MG PO CAPS: ORAL_CAPSULE | ORAL | 2 refills | Status: DC

## 2015-10-21 MED ORDER — PANTOPRAZOLE SODIUM 40 MG PO TBEC: 40.0000 mg | DELAYED_RELEASE_TABLET | Freq: Two times a day (BID) | ORAL | 2 refills | Status: DC

## 2015-10-21 NOTE — Patient Instructions (Addendum)
We have sent the following medications to your pharmacy for you to pick up at your convenience: Pantoprazole 40 mg twice daily Librax 1 tablet 2-3 times daily  Call our office in the next couple of weeks if these medications do not help your symptoms.  Please follow up with Dr Hilarie Fredrickson on Monday, 01/12/16 @ 9:45 am.  If you are age 78 or older, your body mass index should be between 23-30. Your Body mass index is 22.71 kg/m. If this is out of the aforementioned range listed, please consider follow up with your Primary Care Provider.  If you are age 11 or younger, your body mass index should be between 19-25. Your Body mass index is 22.71 kg/m. If this is out of the aformentioned range listed, please consider follow up with your Primary Care Provider.

## 2015-10-22 NOTE — Progress Notes (Signed)
Subjective:    Patient ID: Megan Mercer, female    DOB: 04/09/1935, 80 y.o.   MRN: [id number]  HPI Megan Mercer is a 80 year old female with history of iron deficiency anemia, remote Barrett's esophagus, GERD, small hiatal hernia, remote peptic ulcer disease, atrial fibrillation versus flutter, hypertension, hyperlipidemia and hypothyroidism is here for follow-up. She is here today with her sister. She reports that she has been dealing with severe sub xiphoid chest and epigastric discomfort and "an old familiar" epigastric pain. She was seen by Nicoletta Ba, PA-C on 08/19/2015 to evaluate subxiphoid pain and pressure along with weight loss. CT scan was performed of the abdomen and pelvis with IV contrast which I have reviewed today. This was performed on 08/25/2015. This was unremarkable except for a 3 mm nonobstructing right renal stone. She status post cholecystectomy, appendectomy and hysterectomy. There are no suspicious findings for malignancy. She has been seen by her cardiologist in [place], electrophysiology at [place] and at [facility]. They have been evaluating her intermittent atrial fibrillation/flutter and she has been considering ablative therapy. She continues on propafenone, diltiazem, and Eliquis.    Symptoms seem to really began in May 2017 when she was at the beach. She was transported to the local hospital where she was admitted after being found to have atrial fibrillation with rapid ventricular response. She underwent a negative stress test. She has just completed or is near completion of a Holter monitor 1 month for her [facility] electrophysiologist.  She continues to have episodic pain from the epigastrium to the mid chest which is usually associated with her perception of arrhythmia. Separate from this she describes an epigastric pain which is fairly constant and always there. This is what she calls a "old pain". She was seen again in the ER several weeks ago and her antacid  regimens have been changed. She is off Nexium and Zantac. She is using protonix 40 mg twice daily and a Carafate which she is making into a suspension 3 times a day and at bedtime. She hasn't been able tell any difference in her symptoms with these medications.  Separately she talks about being told by her primary care that a component of this may be related to anxiety. She states that she is a very "type A person" and her primary doctor has question whether she may have depression. He placed her on Paxil which she took for 3 nights and then developed significant nausea vomiting and dry heaving. Became dehydrated and was admitted about 10 days ago. She was also given a Percocet which led to more vomiting. She had electrolyte derangements and was in the hospital for about 2 days.   She has continue oral iron and has been told her iron counts and blood counts have been rising.  She underwent upper endoscopy, colonoscopy and video capsule endoscopy to evaluate iron deficiency anemia last year. Capsule endoscopy performed on 11/18/2014 was incomplete. There were nonspecific and mild erosions in the mid small bowel which was felt to be nonspecific. Iron supplementation was recommended. EGD on 07/09/2014 showed possible short segment Barrett's esophagus which was biopsied, small hiatal hernia, normal stomach and normal duodenum. Biopsies were negative for Barrett's esophagus and celiac disease. Colonoscopy performed on 07/09/2014 was normal.  Review of Systems As per history of present illness, otherwise negative  Current Medications, Allergies, Past Medical History, Past Surgical History, Family History and Social History were reviewed in [employer] record.     Objective:  Physical Exam BP 110/70 (BP Location: Left Arm, Patient Position: Sitting, Cuff Size: Normal)   Pulse 60   Ht _0  (1.6 m)   Wt 128 lb 3.2 oz (58.2 kg)   BMI 22.71 kg/m  Constitutional: Well-developed and  well-nourished. No distress. HEENT: Normocephalic and atraumatic. Oropharynx is clear and moist. No oropharyngeal exudate. Conjunctivae are normal.  No scleral icterus. Neck: Neck supple. Trachea midline. Cardiovascular: Normal rate, regular rhythm and intact distal pulses. No M/R/G Pulmonary/chest: Effort normal and breath sounds normal. No wheezing, rales or rhonchi. Abdominal: Soft, nontender, nondistended. Bowel sounds active throughout. There are no masses palpable. No hepatosplenomegaly. Extremities: no clubbing, cyanosis, or edema Lymphadenopathy: No cervical adenopathy noted. Neurological: Alert and oriented to person place and time. Skin: Skin is warm and dry. No rashes noted. Psychiatric: Normal mood and affect. Behavior is normal.  CBC    Component Value Date/Time   WBC 9.7 08/19/2015 1019   RBC 4.53 08/19/2015 1019   HGB 14.4 08/19/2015 1019   HCT 42.3 08/19/2015 1019   PLT 519.0 (H) 08/19/2015 1019   MCV 93.4 08/19/2015 1019   MCHC 34.0 08/19/2015 1019   RDW 14.0 08/19/2015 1019   LYMPHSABS 1.5 08/19/2015 1019   MONOABS 0.8 08/19/2015 1019   EOSABS 0.1 08/19/2015 1019   BASOSABS 0.1 08/19/2015 1019   CMP     Component Value Date/Time   NA 132 (L) 08/19/2015 1019   K 4.1 08/19/2015 1019   CL 97 08/19/2015 1019   CO2 30 08/19/2015 1019   GLUCOSE 91 08/19/2015 1019   BUN 11 08/19/2015 1019   CREATININE 0.72 08/19/2015 1019   CALCIUM 9.5 08/19/2015 1019   PROT 7.5 08/19/2015 1019   ALBUMIN 4.5 08/19/2015 1019   AST 18 08/19/2015 1019   ALT 18 08/19/2015 1019   ALKPHOS 67 08/19/2015 1019   BILITOT 0.8 08/19/2015 1019   GFRNONAA >60 10/19/2008 0950   GFRAA  10/19/2008 0950    >60        The eGFR has been calculated using the MDRD equation. This calculation has not been validated in all clinical situations. eGFR's persistently <60 mL/min signify possible Chronic Kidney Disease.      Assessment & Plan:  80 year old female with history of iron deficiency  anemia, remote Barrett's esophagus, GERD, small hiatal hernia, remote peptic ulcer disease, atrial fibrillation versus flutter, hypertension, hyperlipidemia and hypothyroidism is here for follow-up.  1. Epigastric and chest pain -- I have a suspicion that most of the symptoms are related to her episodic atrial fibrillation and perhaps a side effect from her propafenone.  Recent GI evaluation with upper endoscopy was unrevealing from a gastritis or peptic ulcer disease standpoint. She also had an unremarkable CT scan of the abdomen and pelvis from a GI/pain standpoint. Anxiety may be contributing. Gastroparesis is also in the differential but felt less likely. --We had a long discussion today regarding treatment options and decided on the following: --Discontinue Carafate and remain off Nexium and Zantac --Continue pantoprazole 40 mg twice a day before meals --Begin Librax 2-3 times daily for abdominal pain/spasm. This also has an anti-anxiety component which she will likely benefit from. We discussed the risk of sedation and should this occur she is asked to notify me immediately and not drive with this medication. She voiced understanding --If not improving after several weeks, she is asked to notify me and we could consider gastric imaging study, empiric trial of Reglan, or even repeat endoscopy. --She will follow-up with  cardiology at [facility] next week as scheduled to consider ablation. If ablation is performed successful she would likely be taken off of her antiarrhythmic therapy which also may improve/reduce symptoms.  45 minutes spent with the patient today. Greater than 50% was spent in counseling and coordination of care with the patient

## 2015-11-05 ENCOUNTER — Telehealth: Payer: Self-pay | Admitting: Internal Medicine

## 2015-11-05 MED ORDER — CILIDINIUM-CHLORDIAZEPOXIDE 2.5-5 MG PO CAPS: ORAL_CAPSULE | ORAL | 2 refills | Status: DC

## 2015-11-05 NOTE — Telephone Encounter (Signed)
Rx sent to [facility] as per patient request.

## 2015-11-05 NOTE — Telephone Encounter (Signed)
Rx sent 

## 2015-11-07 ENCOUNTER — Telehealth: Payer: Self-pay | Admitting: Internal Medicine

## 2015-11-07 NOTE — Telephone Encounter (Signed)
I have attempted prior authorization for Librax for patient. Unfortunately, patient's insurance (medicare) does not cover Librax. Dr Hilarie Fredrickson, please advise.... Maybe Bentyl?

## 2015-11-10 NOTE — Telephone Encounter (Signed)
See if the patient can afford this medication without insurance coverage. It should be generic

## 2015-11-10 NOTE — Telephone Encounter (Signed)
Patient states that she was originally told that the Librax was going to be several thousand dollars without insurance coverage. However, she has been able to find Librax for $85.00 monthly. She states that she is willing to try another full month of the Librax. If it helps,she is willing to pay out of pocket every month. She will call us back to let us know if the medication works or not.

## 2015-11-10 NOTE — Telephone Encounter (Signed)
Left voicemail for patient to call back. 

## 2015-12-04 ENCOUNTER — Telehealth: Payer: Self-pay | Admitting: Internal Medicine

## 2015-12-04 NOTE — Telephone Encounter (Signed)
Ok to try nexium 40 mg bid-ac in place of pantoprazole, should roughly be equivalent Did librax help with her symptoms?

## 2015-12-04 NOTE — Telephone Encounter (Signed)
Pt states she saw her cardiologist and he has not been able to tell her why she is having heaviness in her chest. Pt wants to know if going off of protonix and back to Nexium may help. States he did not want to take her off of propafenone. Please advise.

## 2015-12-04 NOTE — Telephone Encounter (Signed)
Pt stated that the librax did not help with her abdominal cramping.

## 2016-01-12 ENCOUNTER — Ambulatory Visit: Payer: Medicare Other | Admitting: Internal Medicine

## 2016-01-20 ENCOUNTER — Ambulatory Visit: Payer: Medicare Other | Admitting: Internal Medicine

## 2016-07-29 ENCOUNTER — Telehealth: Payer: Self-pay | Admitting: Internal Medicine

## 2016-07-29 NOTE — Telephone Encounter (Signed)
Pt states she is having issues with reflux and her meds are not helping anymore. She started out taking Nexium and it worked for a while and then stopped. Then she went to adding zantac at night time and this worked for a little while then stopped. Then she tried protonix which worked but then stopped. Pt wants to know if there is something else she can try. Reports she has been having some pressure in her chest and some SOB also. States she has told her Cardiologist but he does not feel cardiac. Pt feels she maybe having panic attacks. Please advise regarding something new for reflux.

## 2016-08-02 NOTE — Telephone Encounter (Signed)
Discontinue current PPI Try Dexilant 60 mg daily -- please let her know this is typically our strongest and most long-acting PPI Can use ranitidine 150 mg in the evening for breakthrough reflux if needed She does have a history of anxiety and should discuss panic attacks with her primary provider. Anxiety or panic attack can lead to symptoms mimicking reflux such as atypical chest pain and upper abdominal pain

## 2016-08-03 MED ORDER — RANITIDINE HCL 150 MG PO CAPS: 150.0000 mg | ORAL_CAPSULE | Freq: Every evening | ORAL | 3 refills | Status: DC

## 2016-08-03 MED ORDER — DEXLANSOPRAZOLE 60 MG PO CPDR: 60.0000 mg | DELAYED_RELEASE_CAPSULE | Freq: Every day | ORAL | 3 refills | Status: DC

## 2016-08-03 NOTE — Telephone Encounter (Signed)
Spoke with pt and she is aware. Scripts sent to pharmacy for pt.

## 2016-09-22 ENCOUNTER — Telehealth: Payer: Self-pay | Admitting: Internal Medicine

## 2016-09-22 NOTE — Telephone Encounter (Signed)
Discussed with pt that per her last colon it did not mention hemorrhoids. Pt reports she has been having a lot of BRB from her rectum. Pt quite concerned and requests to be seen. Pt scheduled to see Alonza Bogus PA tomorrow at 2pm. Pt aware of appt.

## 2016-09-23 ENCOUNTER — Encounter: Payer: Self-pay | Admitting: Gastroenterology

## 2016-09-23 ENCOUNTER — Ambulatory Visit (INDEPENDENT_AMBULATORY_CARE_PROVIDER_SITE_OTHER): Payer: Medicare Other | Admitting: Gastroenterology

## 2016-09-23 VITALS — BP 122/68 | HR 60 | Ht 63.25 in | Wt 126.1 lb

## 2016-09-23 DIAGNOSIS — K648 Other hemorrhoids: Secondary | ICD-10-CM | POA: Diagnosis not present

## 2016-09-23 DIAGNOSIS — K625 Hemorrhage of anus and rectum: Secondary | ICD-10-CM

## 2016-09-23 MED ORDER — HYDROCORTISONE ACETATE 25 MG RE SUPP: 25.0000 mg | Freq: Every day | RECTAL | 1 refills | Status: DC

## 2016-09-23 NOTE — Progress Notes (Addendum)
09/23/2016 Megan Mercer [id number] 07/31/1935   HISTORY OF PRESENT ILLNESS:  This is a pleasant 81 year old female who is known to Dr. Hilarie Fredrickson.  She is here today with complaints of rectal bleeding. She says that she's had intermittent episodes of small amounts of bright red blood on the toilet paper over time. She suffers from constipation and has tried several different regimens for that. She is currently taking magnesium oxide 400 mg daily. Says Amitiza did not work and she did not like the Miralax.  She feels like the magnesium oxide does help, but then admits to still having very large hard stools at times. Anyway, she recently had 2 episodes of a larger amount of bleeding, which prompted this visit. She says on both occasions she was walking and felt blood dripping down her leg. She says that both times the bleeding just stopped on its own. Thinks that she probably had a hard bowel movement earlier in the day on both occasions.  Last colonoscopy was 06/2014 at which time the study was normal.  Past Medical History:  Diagnosis Date  . Anemia   . Arthritis   . Atrial fibrillation ([place])   . Barrett's esophagus   . GERD (gastroesophageal reflux disease)   . Hiatal hernia   . HLD (hyperlipidemia)   . HTN (hypertension)   . Hypothyroidism   . Iron deficiency anemia   . Peptic ulcer disease with hemorrhage   . UTI (urinary tract infection)    Past Surgical History:  Procedure Laterality Date  . ABDOMINAL HYSTERECTOMY  1984  . APPENDECTOMY  2007  . CATARACT EXTRACTION    . CHOLECYSTECTOMY  2007    reports that she has never smoked. She has never used smokeless tobacco. She reports that she drinks about 0.6 oz of alcohol per week . She reports that she does not use drugs. family history includes Cancer in her father; Diabetes in her sister; Heart failure in her sister; Hyperlipidemia in her sister; Rheum arthritis in her father, mother, and sister. Allergies  Allergen  Reactions  . Codeine Nausea And Vomiting  . Oxycodone Nausea And Vomiting  . Oxycodone-Acetaminophen Nausea And Vomiting  . Sulfa Antibiotics Nausea And Vomiting      Outpatient Encounter Prescriptions as of 09/23/2016  Medication Sig  . apixaban (ELIQUIS) 5 MG TABS tablet Take 5 mg by mouth 2 (two) times daily.  . busPIRone (BUSPAR) 10 MG tablet 5 mg by mouth in the morning and 10 mg at night  . Calcium Carbonate 1500 (600 CA) MG TABS Take 1 tablet by mouth as needed.  . Cholecalciferol (VITAMIN D3) 1000 UNITS CAPS Take by mouth.  . clidinium-chlordiazePOXIDE (LIBRAX) 5-2.5 MG capsule Take 1 capsule by mouth 2-3 times daily  . colestipol (COLESTID) 1 G tablet Take 1 g by mouth 2 (two) times daily.  Marland Kitchen dexlansoprazole (DEXILANT) 60 MG capsule Take 1 capsule (60 mg total) by mouth daily.  Marland Kitchen diltiazem (TIAZAC) 240 MG 24 hr capsule Take 240 mg by mouth daily.  Marland Kitchen ezetimibe (ZETIA) 10 MG tablet Take 10 mg by mouth daily.  . ferrous sulfate 325 (65 FE) MG tablet Take 325 mg by mouth 2 (two) times daily with a meal.  . FOLIC ACID PO Take 315 mg by mouth as needed.  . latanoprost (XALATAN) 0.005 % ophthalmic solution   . levothyroxine (SYNTHROID, LEVOTHROID) 75 MCG tablet Take 75 mcg by mouth daily before breakfast.  . propafenone (RYTHMOL) 225 MG tablet Take 225  mg by mouth 2 (two) times daily.  . raloxifene (EVISTA) 60 MG tablet Take 60 mg by mouth daily.  . ranitidine (ZANTAC) 150 MG capsule Take 1 capsule (150 mg total) by mouth every evening.  . timolol (TIMOPTIC) 0.5 % ophthalmic solution   . travoprost, benzalkonium, (TRAVATAN) 0.004 % ophthalmic solution Place 1 drop into both eyes at bedtime.  . hydrocortisone (ANUSOL-HC) 25 MG suppository Place 1 suppository (25 mg total) rectally at bedtime.  . pantoprazole (PROTONIX) 40 MG tablet Take 1 tablet (40 mg total) by mouth 2 (two) times daily.   No facility-administered encounter medications on file as of 09/23/2016.      REVIEW OF SYSTEMS   : All other systems reviewed and negative except where noted in the History of Present Illness.   PHYSICAL EXAM: BP 122/68 (BP Location: Left Arm, Patient Position: Sitting, Cuff Size: Normal)   Pulse 60   Ht 5' 3.25" (1.607 m) Comment: height measured without shoes  Wt 126 lb 2 oz (57.2 kg)   BMI 22.17 kg/m  General: Well developed white female in no acute distress Head: Normocephalic and atraumatic Eyes:  Sclerae anicteric, conjunctiva pink. Ears: Normal auditory acuity Lungs: Clear throughout to auscultation; no increased WOB. Heart: Regular rate and rhythm Abdomen: Soft, non-distended.  BS present.  Non-tender. Rectal:  No external abnormalities identified.  DRE revealed decreased sphincter tone.  No stool on exam glove.  Anoscopy revealed small internal hemorrhoids, nothing with stigmata of recent bleeding. Musculoskeletal: Symmetrical with no gross deformities  Skin: No lesions on visible extremities Extremities: No edema  Neurological: Alert oriented x 4, grossly non-foca Psychological:  Alert and cooperative. Normal mood and affect  ASSESSMENT AND PLAN: -Rectal bleeding:  Intermittent bright red bleeding.  Likely from internal hemorrhoids.  Only small internal hemorrhoids seen on exam today, but do not suspect any other serious cause with fairly recent colonoscopy.  Will treat with hydrocortisone suppositories at bedtime for 10 days. She will let us know if this continues to occur frequently as we may need to consider possible banding.   CC:  Pomposini, Cherly Anderson, MD  Addendum: Reviewed and agree with initial management. Pyrtle, Lajuan Lines, MD

## 2016-09-23 NOTE — Patient Instructions (Signed)
We have sent a prescription for Hydrocortisone cream to your pharmacy. You should apply a pea size amount to your rectum twice a day x 10 days. Insert up into rectum until the first knuckle of your index finger.

## 2016-09-24 ENCOUNTER — Telehealth: Payer: Self-pay | Admitting: Internal Medicine

## 2016-09-24 NOTE — Telephone Encounter (Signed)
We can try omeprazole 40 mg BID for now but please let her know that she has been on a few others before that have apparently not worked for her so we will just have to see how she does with it.  Thank you,  Jess

## 2016-09-24 NOTE — Telephone Encounter (Signed)
Left voicemail for patient to call back. 

## 2016-09-24 NOTE — Telephone Encounter (Signed)
Fisher saw this patient yesterday in relation to lower GI symptoms. However, in Dr Vena Rua absence, are you able to advise regarding this?

## 2016-09-27 MED ORDER — OMEPRAZOLE 40 MG PO CPDR: 40.0000 mg | DELAYED_RELEASE_CAPSULE | Freq: Two times a day (BID) | ORAL | 0 refills | Status: DC

## 2016-09-27 NOTE — Telephone Encounter (Signed)
Pt said she is returning your call [phone]

## 2016-09-27 NOTE — Telephone Encounter (Signed)
I have spoken to the patient and have given Jessica's recommendations and have reminded her that in the past, other PPI's have not worked well for her. She states that a personal friend who is a Software engineer has suggested omeprazole to her and she would like to try it. I have sent rx to [facility] for a 1 month supply for now. She will call us back to let us know how the medication works.

## 2016-10-01 ENCOUNTER — Ambulatory Visit: Payer: Medicare Other | Admitting: Physician Assistant

## 2016-10-06 ENCOUNTER — Ambulatory Visit: Payer: Medicare Other | Admitting: Physician Assistant

## 2016-10-27 ENCOUNTER — Telehealth: Payer: Self-pay | Admitting: Internal Medicine

## 2016-10-27 ENCOUNTER — Ambulatory Visit: Payer: Medicare Other | Admitting: Internal Medicine

## 2016-10-27 NOTE — Telephone Encounter (Signed)
Pt has appt already scheduled for 11/24/16 for epigastic/chest discomfort. She is interested in having hemorrhoid banding done at that appt also. Pt takes Eliquis 5mg  BID. Please advise if pt can have banding done at this visit and regarding Eliquis.

## 2016-10-28 NOTE — Telephone Encounter (Signed)
Would recommend we further discussed banding in the setting of her anticoagulation therapy in person Being off of anticoagulation before banding is normally preferred but also has risks and we need to discuss the risks, benefits and alternatives together before proceeding

## 2016-10-28 NOTE — Telephone Encounter (Signed)
Spoke with pt and she is aware. Appt scheduled in November for 1st banding slot.

## 2016-11-11 ENCOUNTER — Ambulatory Visit: Payer: Medicare Other | Admitting: Internal Medicine

## 2016-11-17 ENCOUNTER — Telehealth: Payer: Self-pay | Admitting: *Deleted

## 2016-11-17 ENCOUNTER — Other Ambulatory Visit: Payer: Self-pay | Admitting: *Deleted

## 2016-11-17 ENCOUNTER — Ambulatory Visit: Payer: Medicare Other | Admitting: Internal Medicine

## 2016-11-17 MED ORDER — OMEPRAZOLE 40 MG PO CPDR: 40.0000 mg | DELAYED_RELEASE_CAPSULE | Freq: Two times a day (BID) | ORAL | 2 refills | Status: DC

## 2016-11-17 NOTE — Telephone Encounter (Signed)
Received fax from [facility] for refill request for Omeprazole 40 mg, twice daily.  Per Quintin Alto, CMA said I can send refills for the patient. I did send # [phone] with 2 refills. The patient does have a follow up with Dr. Hilarie Fredrickson on 11-24-2016 @ 8:45 am.

## 2016-11-24 ENCOUNTER — Ambulatory Visit (INDEPENDENT_AMBULATORY_CARE_PROVIDER_SITE_OTHER): Payer: Medicare Other | Admitting: Internal Medicine

## 2016-11-24 ENCOUNTER — Telehealth: Payer: Self-pay | Admitting: *Deleted

## 2016-11-24 ENCOUNTER — Encounter: Payer: Self-pay | Admitting: Internal Medicine

## 2016-11-24 VITALS — BP 106/60 | HR 64 | Ht 63.25 in | Wt 125.5 lb

## 2016-11-24 DIAGNOSIS — K219 Gastro-esophageal reflux disease without esophagitis: Secondary | ICD-10-CM | POA: Diagnosis not present

## 2016-11-24 DIAGNOSIS — K625 Hemorrhage of anus and rectum: Secondary | ICD-10-CM

## 2016-11-24 DIAGNOSIS — R0789 Other chest pain: Secondary | ICD-10-CM

## 2016-11-24 DIAGNOSIS — I4891 Unspecified atrial fibrillation: Secondary | ICD-10-CM | POA: Diagnosis not present

## 2016-11-24 MED ORDER — RANITIDINE HCL 150 MG PO CAPS: 150.0000 mg | ORAL_CAPSULE | Freq: Every day | ORAL | 3 refills | Status: DC

## 2016-11-24 NOTE — Patient Instructions (Signed)
You have been scheduled for a flexible sigmoidoscopy. Please follow the written instructions given to you at your visit today. If you use inhalers (even only as needed), please bring them with you on the day of your procedure.   You will be contacted by our office prior to your procedure for directions on holding your Eliquis.  If you do not hear from our office 1 week prior to your scheduled procedure, please call [phone] to discuss.   Please start taking your Prilosec (omeprazole) twice daily before meals.  We have sent the following medications to your pharmacy for you to pick up at your convenience: Ranitidine 150 mg every night.  Please contact [facility] regarding the 30 days event monitor.  If you are age 52 or older, your body mass index should be between 23-30. Your Body mass index is 22.06 kg/m. If this is out of the aforementioned range listed, please consider follow up with your Primary Care Provider.  If you are age 51 or younger, your body mass index should be between 19-25. Your Body mass index is 22.06 kg/m. If this is out of the aformentioned range listed, please consider follow up with your Primary Care Provider.

## 2016-11-24 NOTE — Progress Notes (Signed)
Subjective:    Patient ID: Megan Mercer, female    DOB: [birth year], 81 y.o.   MRN: [id number]  HPI Megan Mercer is a 81 year old female with a past medical history of iron deficiency anemia, remote Barrett's esophagus, GERD, small hiatal hernia, remote peptic ulcer disease, atrial fibrillation, hypertension, hyperlipidemia and hypothyroidism who is seen for follow-up. She was last seen about 2 months ago by Alonza Bogus, PA-C to evaluate intermittent rectal bleeding. Examination at that time revealed small internal hemorrhoids which was felt to be the culprit and she was given hydrocortisone suppositories.  Today she presents alone and wishes to discuss multiple issues.  She is having some indigestion on occasion. She is using Prilosec 40 mg twice daily. She was previously on Zantac at bedtime but has not been using this recently. Her indigestion is worse at nights and after taking medications. She denies dysphagia and odynophagia  She is having a symptom of pressure substernal and near the xiphoid along with the feeling that she cannot catch her breath. This is happening particularly at night often shortly after getting out of bed to go urinate. She has been seen by [facility] pulmonology and [facility] cardiology. She was told after testing that her lungs were normal. She has also with cardiology undergone one week of Holter monitoring which she reports revealed pauses. They recommended a 30 day cardiac monitoring but she states due to vanity she did not wish to wear this monitor. She tried the use and application called Jodelle Red which can check her pulse using 2 fingers on her smart phone but this has been inconsistent. This symptom is not occurring during the day.  She also feels that the above-mentioned symptoms are worsened by stress. She's been started on buspirone. She previously used Librax but has not used this recently and is unsure why not. She wonders if it could help her.  She is still  experiencing intermittent and inconsistent rectal bleeding. This is usually not during bowel movement but more likely noticed during activity such as walking. She will walk for exercise and on and off have blood on the pad that she wears and occasionally into her underwear. She's having some intermittent fecal smearing between bowel movements. She is having a bowel movement every day. She is using magnesium every other day to help prevent constipation. She takes magnesium daily she will get loose stools. She denies diarrhea. She denies melena. She is on Eliquis 5 mg twice daily.  Her last upper endoscopy and colonoscopy were performed in May 2016. Upper endoscopy showed a small hiatal hernia and was otherwise unremarkable. There was no Barrett's by biopsy. Her colonoscopy was normal. She had an incomplete video capsule endoscopy performed thereafter in September 2016 which showed some nonspecific erosion in the small bowel without active bleeding. Iron supplementation was recommended and continued   Review of Systems As per HPI, otherwise negative  Current Medications, Allergies, Past Medical History, Past Surgical History, Family History and Social History were reviewed in [employer] record.     Objective:   Physical Exam BP 106/60 (BP Location: Left Arm, Patient Position: Sitting, Cuff Size: Normal)   Pulse 64   Ht 5' 3.25" (1.607 m)   Wt 125 lb 8 oz (56.9 kg)   BMI 22.06 kg/m  Constitutional: Well-developed and well-nourished. No distress. HEENT: Normocephalic and atraumatic.Marland Kitchen Conjunctivae are normal.  No scleral icterus. Neck: Neck supple. Trachea midline. Cardiovascular: Normal rate, regular rhythm and intact distal pulses. No M/R/G Pulmonary/chest:  Effort normal and breath sounds normal. No wheezing, rales or rhonchi. Abdominal: Soft, nontender, nondistended. Bowel sounds active throughout.  Extremities: no clubbing, cyanosis, or edema Neurological: Alert and  oriented to person place and time. Skin: Skin is warm and dry. Psychiatric: Normal mood and affect. Behavior is normal.  Labs reviewed via care everywhere Hemoglobin normal 14.5 when last checked about one year ago    Assessment & Plan:  81 year old female with a past medical history of iron deficiency anemia, remote Barrett's esophagus, GERD, small hiatal hernia, remote peptic ulcer disease, atrial fibrillation, hypertension, hyperlipidemia and hypothyroidism who is seen for follow-up.  1. Chest pressure with shortness of breath -- I do not think this is GI related symptoms. More likely this is related to arrhythmia and possibly arrhythmia/pauses. I have suggested that she return to [facility] cardiology and undergo the 30 day event monitor where she can objectively determine if the symptoms are cardiac in nature. She is willing to do this and will contact [facility] for follow-up. If cardiac evaluation truly negative we could consider adding Librax back at bedtime.  2. GERD/indigestion -- history of. She is on twice a day PPI. We discussed that this is very likely adequate for complete acid suppression. I'm going to continue this dose before meals, omeprazole 40 mg, and add back ranitidine 150 mg at bedtime just to ensure nocturnal acid control.  3. Rectal bleeding -- could be secondary to internal hemorrhoids however I have recommended that we perform a flexible sigmoidoscopy to evaluate the sigmoid, rectum and anorectum. She may be a candidate for hemorrhoidal banding the pending on the results of the study. We discussed the risks, benefits and alternatives to this procedure and she is agreeable and wishes to proceed. We will perform this study on Eliquis.  45 minutes spent with the patient today. Greater than 50% was spent in counseling and coordination of care with the patient

## 2016-11-24 NOTE — Telephone Encounter (Signed)
11/24/2016   RE: Megan Mercer DOB: 1935-06-27 MRN: [id number]   Dear Dr Mylinda Latina:    We have scheduled the above patient for a flexible sigmoidoscopy procedure. Our records show that she is on anticoagulation therapy.   Please advise as to whether the patient may come off her therapy of Eliquis 2 days prior to the procedure, which is scheduled for 12/16/16.  Please fax your response to Dixon Boos, Tib[place] at [phone].   Sincerely,  Dixon Boos

## 2016-12-08 NOTE — Telephone Encounter (Signed)
I have refaxed note for the 3rd time to Dr Mylinda Latina' office. Today, I spoke to [place] who gave me a different fax [phone]). She states that the fax we have goes to the main appointment desk which may be why they are not getting our faxes.

## 2016-12-09 NOTE — Telephone Encounter (Signed)
While waiting on a response from Dr Mylinda Latina, patient's electrophysiologist at [facility], thought to be who is prescribing patient's Eliquis, we have received a fax from Dr Carrolyn Leigh with [facility] in [place], [place] stating:  "Date:December 09, 2016  Re: Megan Mercer--DOB--May 06, 1935  Dear DR Clayburn Pert,  This is to inform you that the above mentioned patient is followed by me and has been cleared for her upcoming colonoscopy. She should hold her Eliquis 24 hours prior to the procedure and then resume it post procedure.  I have instructed the patient to contact our office if there are any new symptoms prior to the date of scheduled surgery.  Please do not hesitate to call me if I could be of further assistance.  Sincerely,  Eber Hong.Gibson Ramp, MD [facility]"   Dr Hilarie Fredrickson, with the above information, do we take this as clearance to hold Eliquis or wait on Dr Marcello Moores' clearance? Also, is it ok with you to hold Eliquis over only 24 hour period?

## 2016-12-10 NOTE — Telephone Encounter (Signed)
Dr Hilarie Fredrickson, see note below in addition to this note....  We have received faxed response from Dr Hulan Fess at [facility] stating: " 12/10/16 To Whom It May Concern: Ms. Wemhoff is a patient that is under my care. She may stop her Eliquis 2 days prior to her procedure and resume the day following her procedure.  If you have any further questions, please feel free to contact our office.  Sincerely,  Marzetta Board, MD"  At this point, we have received 2 different instructions from 2 different cardiology doctors. Which doctor do I give instruction from?

## 2016-12-13 NOTE — Telephone Encounter (Signed)
Okay to hold Eliquis 2 days per her doctor, Dr. Mylinda Latina with [facility] electrophysiology

## 2016-12-13 NOTE — Telephone Encounter (Signed)
I have spoken to patient to advise that Dr Mylinda Latina has okayed her to hold Eliquis 2 days prior to her procedure and Dr Hilarie Fredrickson has also agreed. Patient verbalizes understanding of this and has repeated these instructions back to these.

## 2016-12-16 ENCOUNTER — Ambulatory Visit (AMBULATORY_SURGERY_CENTER): Payer: Medicare Other | Admitting: Internal Medicine

## 2016-12-16 ENCOUNTER — Encounter: Payer: Self-pay | Admitting: Internal Medicine

## 2016-12-16 VITALS — BP 132/75 | HR 59 | Temp 98.6°F | Resp 16 | Ht 63.0 in | Wt 125.0 lb

## 2016-12-16 DIAGNOSIS — K625 Hemorrhage of anus and rectum: Secondary | ICD-10-CM

## 2016-12-16 DIAGNOSIS — K649 Unspecified hemorrhoids: Secondary | ICD-10-CM | POA: Diagnosis not present

## 2016-12-16 MED ORDER — SODIUM CHLORIDE 0.9 % IV SOLN: 500.0000 mL | INTRAVENOUS | Status: DC

## 2016-12-16 NOTE — Op Note (Signed)
[facility] Patient Name: Megan Mercer Procedure Date: 12/16/2016 1:31 PM MRN: [id number] Endoscopist: Jerene Bears , MD Age: 81 Referring MD:  Date of Birth: 1935/03/31 Gender: Female Account #: [id number] Procedure:                Flexible Sigmoidoscopy Indications:              Rectal hemorrhage, normal colonoscopy in 2016 Medicines:                Monitored Anesthesia Care Procedure:                Pre-Anesthesia Assessment:                           - Prior to the procedure, a History and Physical                            was performed, and patient medications and                            allergies were reviewed. The patient's tolerance of                            previous anesthesia was also reviewed. The risks                            and benefits of the procedure and the sedation                            options and risks were discussed with the patient.                            All questions were answered, and informed consent                            was obtained. Prior Anticoagulants: The patient has                            taken Eliquis (apixaban), last dose was 2 days                            prior to procedure. ASA Grade Assessment: III - A                            patient with severe systemic disease. After                            reviewing the risks and benefits, the patient was                            deemed in satisfactory condition to undergo the                            procedure.  After obtaining informed consent, the scope was                            passed under direct vision. The Model PCF-H190DL                            [id number] [id number]) scope was introduced through the anus                            and advanced to the the descending colon. The                            flexible sigmoidoscopy was accomplished without                            difficulty. The patient tolerated the  procedure                            well. The quality of the bowel preparation was                            excellent. Scope In: 1:49:47 PM Scope Out: 1:54:43 PM Total Procedure Duration: 0 hours 4 minutes 56 seconds  Findings:                 The digital rectal exam was normal.                           The rectum, sigmoid colon and descending colon                            appeared normal.                           Internal hemorrhoids were found during                            retroflexion. The hemorrhoids were small. Complications:            No immediate complications. Estimated Blood Loss:     Estimated blood loss: none. Impression:               - The rectum, sigmoid colon and examined descending                            colon are normal.                           - Small internal hemorrhoids.                           - No specimens collected. Recommendation:           - Patient has a contact number available for                            emergencies. The signs and symptoms of potential  delayed complications were discussed with the                            patient. Return to normal activities tomorrow.                            Written discharge instructions were provided to the                            patient.                           - Resume previous diet.                           - Continue present medications.                           - Resume Eliquis (apixaban) at prior dose today.                           - With normal blood counts would recommend                            observation and would not recommend hemorrhoidal                            banding at this time given the need for chronic                            anticoagulation. Jerene Bears, MD 12/16/2016 2:02:54 PM This report has been signed electronically.

## 2016-12-16 NOTE — Patient Instructions (Signed)
YOU HAD AN ENDOSCOPIC PROCEDURE TODAY AT [facility]:   Refer to the procedure report that was given to you for any specific questions about what was found during the examination.  If the procedure report does not answer your questions, please call your gastroenterologist to clarify.  If you requested that your care partner not be given the details of your procedure findings, then the procedure report has been included in a sealed envelope for you to review at your convenience later.  YOU SHOULD EXPECT: Some feelings of bloating in the abdomen. Passage of more gas than usual.  Walking can help get rid of the air that was put into your GI tract during the procedure and reduce the bloating. If you had a lower endoscopy (such as a colonoscopy or flexible sigmoidoscopy) you may notice spotting of blood in your stool or on the toilet paper. If you underwent a bowel prep for your procedure, you may not have a normal bowel movement for a few days.  Please Note:  You might notice some irritation and congestion in your nose or some drainage.  This is from the oxygen used during your procedure.  There is no need for concern and it should clear up in a day or so.  SYMPTOMS TO REPORT IMMEDIATELY:   Following lower endoscopy (colonoscopy or flexible sigmoidoscopy):  Excessive amounts of blood in the stool  Significant tenderness or worsening of abdominal pains  Swelling of the abdomen that is new, acute  Fever of 100F or higher   For urgent or emergent issues, a gastroenterologist can be reached at any hour by calling [phone].   DIET:  We do recommend a small meal at first, but then you may proceed to your regular diet.  Drink plenty of fluids but you should avoid alcoholic beverages for 24 hours.  ACTIVITY:  You should plan to take it easy for the rest of today and you should NOT DRIVE or use heavy machinery until tomorrow (because of the sedation medicines used during the test).     FOLLOW UP: Our staff will call the number listed on your records the next business day following your procedure to check on you and address any questions or concerns that you may have regarding the information given to you following your procedure. If we do not reach you, we will leave a message.  However, if you are feeling well and you are not experiencing any problems, there is no need to return our call.  We will assume that you have returned to your regular daily activities without incident.  If any biopsies were taken you will be contacted by phone or by letter within the next 1-3 weeks.  Please call us at [phone] if you have not heard about the biopsies in 3 weeks.    SIGNATURES/CONFIDENTIALITY: You and/or your care partner have signed paperwork which will be entered into your electronic medical record.  These signatures attest to the fact that that the information above on your After Visit Summary has been reviewed and is understood.  Full responsibility of the confidentiality of this discharge information lies with you and/or your care-partner.  Good report.   You may resume your eliquis back today at your previous dose per Dr. Hilarie Fredrickson.

## 2016-12-16 NOTE — Progress Notes (Signed)
To PACU, VSS. Report to [email or web address] 

## 2016-12-17 ENCOUNTER — Telehealth: Payer: Self-pay

## 2016-12-17 NOTE — Telephone Encounter (Signed)
  Follow up Call-  Call back number 12/16/2016 07/09/2014  Post procedure Call Back phone  # [phone]  Permission to leave phone message Yes Yes  Some recent data might be hidden     Patient questions:  Do you have a fever, pain , or abdominal swelling? No. Pain Score  0 *  Have you tolerated food without any problems? Yes.    Have you been able to return to your normal activities? Yes.    Do you have any questions about your discharge instructions: Diet   No. Medications  No. Follow up visit  No.  Do you have questions or concerns about your Care? No.  Actions: * If pain score is 4 or above: No action needed, pain <4.  No problems noted per pt. maw

## 2016-12-20 ENCOUNTER — Encounter: Payer: Self-pay | Admitting: *Deleted

## 2016-12-22 ENCOUNTER — Telehealth: Payer: Self-pay | Admitting: Internal Medicine

## 2016-12-22 NOTE — Telephone Encounter (Signed)
Pt called and wants to be referred to [facility] for a second opinion regarding her hemorrhoids and fecal incontinence. Pt wants to remain a pt of Dr. Vena Rua but would like to see if [facility] has anything else to offer. Requested records be faxed to [place] [phone]. Records faxed per pt request.

## 2017-01-10 ENCOUNTER — Encounter: Payer: Medicare Other | Admitting: Internal Medicine

## 2017-02-10 ENCOUNTER — Telehealth: Payer: Self-pay | Admitting: *Deleted

## 2017-02-10 NOTE — Telephone Encounter (Signed)
Per Dr Hilarie Fredrickson, he has reviewed records from patient's second opinion at [facility]. After review of these records, it is the recommendation that patient had a full colonsocopy for persistent blood with bowel movements after negative flexible sigmoidoscopy. I have contacted the patient to advise of these recommendations and set her up for this procedure. However, patient states that she wants the surgeon that she saw at [facility] to do this as she had agreed previously complete the procedure for patient if she wished. Patient states that she thinks "there is just something very wrong here" and "I'd rather just let the surgeon go ahead and do this."

## 2017-03-29 ENCOUNTER — Other Ambulatory Visit: Payer: Self-pay | Admitting: Emergency Medicine

## 2017-03-29 MED ORDER — OMEPRAZOLE 40 MG PO CPDR: 40.0000 mg | DELAYED_RELEASE_CAPSULE | Freq: Two times a day (BID) | ORAL | 0 refills | Status: DC

## 2017-03-29 NOTE — Telephone Encounter (Signed)
Refill sent to the pharmacy for omeprazole.

## 2017-06-15 ENCOUNTER — Other Ambulatory Visit: Payer: Self-pay | Admitting: *Deleted

## 2017-06-15 MED ORDER — OMEPRAZOLE 40 MG PO CPDR: 40.0000 mg | DELAYED_RELEASE_CAPSULE | Freq: Two times a day (BID) | ORAL | 2 refills | Status: DC

## 2017-07-21 ENCOUNTER — Ambulatory Visit (INDEPENDENT_AMBULATORY_CARE_PROVIDER_SITE_OTHER): Payer: Medicare Other | Admitting: Physician Assistant

## 2017-07-21 ENCOUNTER — Encounter: Payer: Self-pay | Admitting: Physician Assistant

## 2017-07-21 VITALS — BP 102/62 | HR 64 | Ht 63.25 in | Wt 121.6 lb

## 2017-07-21 DIAGNOSIS — K582 Mixed irritable bowel syndrome: Secondary | ICD-10-CM | POA: Diagnosis not present

## 2017-07-21 DIAGNOSIS — R1031 Right lower quadrant pain: Secondary | ICD-10-CM

## 2017-07-21 DIAGNOSIS — R1032 Left lower quadrant pain: Secondary | ICD-10-CM

## 2017-07-21 MED ORDER — DICYCLOMINE HCL 20 MG PO TABS: ORAL_TABLET | ORAL | 1 refills | Status: DC

## 2017-07-21 NOTE — Progress Notes (Addendum)
Chief Complaint: Inconsistent bowel habits, abdominal pain  HPI:    Megan Mercer is an 82 year old female, known to Dr. Hilarie Fredrickson, with past medical history as listed below, who presents to clinic today with a complaint of inconsistent bowel movements and abdominal pain.    Patient spends time today discussing her history of rectal bleeding in the past for which she has been seen in our clinic, this is somewhat better at this time.  She did follow with a gastroenterologist at [facility] regarding this symptom as well as her inconsistent stools radiating from constipation to diarrhea with some fecal incontinence.  Patient had colonoscopy 02/24/2017 with pathology sent with biopsies from cecum, right/descending colon and left/descending colon which were all benign with no microscopic colitis.  The colonoscopy attachment is not available.  Patient tells me this was negative.    Today, patient's largest complaint is that she has very inconsistent bowel habits.  She can have a couple of days with no problem at all but then also have days with "violent pain" in her lower abdomen which sometimes starts at night and can last all night long with diarrhea. Rated as an 8-9/10. This first occurred about a week and a half ago and since then has reoccurred 3 days since then, not altogether.  Patient describes that she can wake up and have a normal solid bowel movement in the morning and then this is followed by loose diarrheal stool throughout the day.  She is afraid to go walking with her friends or really do anything as she is uncertain what the day will bring.  Does describe accompanying lower abdominal cramping which typically precedes a bowel movement and is relieved afterwards.  Patient is nervous because she has a trip scheduled to be out of the country in a month and does not know if she can go on it if she is having all these symptoms.    Does describe being a "type A" personality.  Has had issues like this off and on for  many years.    Denies fever, chills, blood in her stool, melena, weight loss, anorexia, nausea or vomiting.  Past Medical History:  Diagnosis Date  . Anemia   . Anxiety   . Arthritis   . Atrial fibrillation ([place])   . Barrett's esophagus   . Blood transfusion without reported diagnosis   . GERD (gastroesophageal reflux disease)   . Glaucoma   . Hiatal hernia   . HLD (hyperlipidemia)   . HTN (hypertension)   . Hypothyroidism   . Internal hemorrhoids   . Iron deficiency anemia   . Peptic ulcer disease with hemorrhage   . UTI (urinary tract infection)     Past Surgical History:  Procedure Laterality Date  . ABDOMINAL HYSTERECTOMY  1984  . APPENDECTOMY  2007  . CATARACT EXTRACTION Bilateral   . CHOLECYSTECTOMY  2007  . COLONOSCOPY    . UPPER GASTROINTESTINAL ENDOSCOPY      Current Outpatient Medications  Medication Sig Dispense Refill  . apixaban (ELIQUIS) 5 MG TABS tablet Take 5 mg by mouth 2 (two) times daily.    . busPIRone (BUSPAR) 10 MG tablet 5 mg by mouth in the morning and 10 mg at night    . Calcium Carbonate 1500 (600 CA) MG TABS Take 1 tablet by mouth as needed.    . Cholecalciferol (VITAMIN D3) 1000 UNITS CAPS Take by mouth.    . colestipol (COLESTID) 1 G tablet Take 1 g by mouth 2 (  two) times daily.    Marland Kitchen diltiazem (TIAZAC) 240 MG 24 hr capsule Take 240 mg by mouth daily.    Marland Kitchen ezetimibe (ZETIA) 10 MG tablet Take 10 mg by mouth daily.    Marland Kitchen FOLIC ACID PO Take 242 mg by mouth as needed.    Marland Kitchen levothyroxine (SYNTHROID, LEVOTHROID) 75 MCG tablet Take 75 mcg by mouth daily before breakfast.    . MAGNESIUM PO Take 85 mg by mouth every other day.    Marland Kitchen omeprazole (PRILOSEC) 40 MG capsule Take 1 capsule (40 mg total) by mouth 2 (two) times daily. 60 capsule 2  . propafenone (RYTHMOL) 225 MG tablet Take 225 mg by mouth 2 (two) times daily.    . raloxifene (EVISTA) 60 MG tablet Take 60 mg by mouth daily.    . ranitidine (ZANTAC) 150 MG capsule Take 1 capsule (150 mg total)  by mouth at bedtime. 30 capsule 3  . timolol (TIMOPTIC) 0.5 % ophthalmic solution     . travoprost, benzalkonium, (TRAVATAN) 0.004 % ophthalmic solution Place 1 drop into both eyes at bedtime.    . dicyclomine (BENTYL) 20 MG tablet Take 1 tab by mouth  4 times daily for 20-30 min before meals and at bedtime. 120 tablet 1   No current facility-administered medications for this visit.     Allergies as of 07/21/2017 - Review Complete 07/21/2017  Allergen Reaction Noted  . Ace inhibitors Cough   . Aspirin    . Atorvastatin    . Codeine Nausea And Vomiting 05/14/2014  . Iodine Nausea Only 08/09/2016  . Oxycodone Nausea And Vomiting 10/10/2015  . Oxycodone-acetaminophen Nausea And Vomiting 07/09/2014  . Paroxetine hcl Nausea Only 11/03/2015  . Sulfa antibiotics Nausea And Vomiting 05/14/2014    Family History  Problem Relation Age of Onset  . Cancer Father        blood cancer, ? type  . Rheum arthritis Father   . Glaucoma Father   . Rheum arthritis Mother   . Rheum arthritis Sister   . Diabetes Sister        TYPE 2  . Heart failure Sister   . Hyperlipidemia Sister   . Colon cancer Neg Hx   . Esophageal cancer Neg Hx   . Pancreatic cancer Neg Hx   . Stomach cancer Neg Hx   . Liver disease Neg Hx     Social History   Socioeconomic History  . Marital status: Single    Spouse name: Not on file  . Number of children: 0  . Years of education: Not on file  . Highest education level: Not on file  Occupational History  . Occupation: RETIRED  Social Needs  . Financial resource strain: Not on file  . Food insecurity:    Worry: Not on file    Inability: Not on file  . Transportation needs:    Medical: Not on file    Non-medical: Not on file  Tobacco Use  . Smoking status: Never Smoker  . Smokeless tobacco: Never Used  Substance and Sexual Activity  . Alcohol use: Yes    Alcohol/week: 0.0 oz    Comment: rare  . Drug use: No  . Sexual activity: Not on file  Lifestyle    . Physical activity:    Days per week: Not on file    Minutes per session: Not on file  . Stress: Not on file  Relationships  . Social connections:    Talks on phone: Not on file  Gets together: Not on file    Attends religious service: Not on file    Active member of club or organization: Not on file    Attends meetings of clubs or organizations: Not on file    Relationship status: Not on file  . Intimate partner violence:    Fear of current or ex partner: Not on file    Emotionally abused: Not on file    Physically abused: Not on file    Forced sexual activity: Not on file  Other Topics Concern  . Not on file  Social History Narrative  . Not on file    Review of Systems:    Constitutional: No weight loss, fever or chills Cardiovascular: No chest pain   Respiratory: No SOB  Gastrointestinal: See HPI and otherwise negative   Physical Exam:  Vital signs: BP 102/62   Pulse 64   Ht 5' 3.25" (1.607 m)   Wt 121 lb 9.6 oz (55.2 kg)   BMI 21.37 kg/m   Constitutional:   Pleasant Caucasian female appears to be in NAD, Well developed, Well nourished, alert and cooperative Respiratory: Respirations even and unlabored. Lungs clear to auscultation bilaterally.   No wheezes, crackles, or rhonchi.  Cardiovascular: Normal S1, S2. No MRG. Regular rate and rhythm. No peripheral edema, cyanosis or pallor.  Gastrointestinal:  Soft, nondistended, moderate b/l lower abdominal ttp, mild epigastric ttp. No rebound or guarding. Normal bowel sounds. No appreciable masses or hepatomegaly. Rectal:  Not performed.  Psychiatric:  Demonstrates good judgement and reason without abnormal affect or behaviors.  RELEVANT LABS AND IMAGING: CBC    Component Value Date/Time   WBC 9.7 08/19/2015 1019   RBC 4.53 08/19/2015 1019   HGB 14.4 08/19/2015 1019   HCT 42.3 08/19/2015 1019   PLT 519.0 (H) 08/19/2015 1019   MCV 93.4 08/19/2015 1019   MCHC 34.0 08/19/2015 1019   RDW 14.0 08/19/2015 1019    LYMPHSABS 1.5 08/19/2015 1019   MONOABS 0.8 08/19/2015 1019   EOSABS 0.1 08/19/2015 1019   BASOSABS 0.1 08/19/2015 1019    CMP     Component Value Date/Time   NA 132 (L) 08/19/2015 1019   K 4.1 08/19/2015 1019   CL 97 08/19/2015 1019   CO2 30 08/19/2015 1019   GLUCOSE 91 08/19/2015 1019   BUN 11 08/19/2015 1019   CREATININE 0.72 08/19/2015 1019   CALCIUM 9.5 08/19/2015 1019   PROT 7.5 08/19/2015 1019   ALBUMIN 4.5 08/19/2015 1019   AST 18 08/19/2015 1019   ALT 18 08/19/2015 1019   ALKPHOS 67 08/19/2015 1019   BILITOT 0.8 08/19/2015 1019   GFRNONAA >60 10/19/2008 0950   GFRAA  10/19/2008 0950    >60        The eGFR has been calculated using the MDRD equation. This calculation has not been validated in all clinical situations. eGFR's persistently <60 mL/min signify possible Chronic Kidney Disease.    Assessment: 1.  Lower abdominal cramping: before a BM, typically relieved afterward, consistent with IBS 2.  IBS: alternating bowel habits, preceded by cramping, normal colonoscopy in January  Plan: 1.  Discussed IBS in detail with the patient today. 2.  Recommend the patient trial the low FODMAP diet for 6 weeks.  Provided her with a copy of this. 3.  Recommend patient increase fiber in her diet for to at least 25-35 g/day with use of a fiber supplement such as Metamucil, Citrucel or Benefiber.  Patient discussed that she had tried this  for only 3 days in a row in the past and had not really done much.  Recommend she stay on this for at least 2 weeks before coming to a conclusion of whether it is helpful. 4.  Recommend patient start IBgard 2 tabs twice daily, provided her with a coupon today.  Recommend she stay on this for at least a month, then decrease to 1 tab twice daily. 5.  Prescribed Dicyclomine 20 mg 4 times daily, 20-30 minutes before meals and at bedtime.  #120 with 1 refill 6.  Patient to follow in clinic with me or Dr. Hilarie Fredrickson in 4-6 weeks or sooner if  necessary. 7. Requested colonoscopy report from January 8.  I will have my nurse Katina Degree call the patient in 2 weeks to see how she is doing before she leaves for her trip.  Did discuss that she could use Imodium or even possibly Lomotil if this is needed while she is away.  Ellouise Newer, PA-C [facility] 07/21/2017, 3:16 PM  Cc: Pomposini, Cherly Anderson, MD   Addendum: Reviewed and agree with management. Pyrtle, Lajuan Lines, MD

## 2017-07-21 NOTE — Patient Instructions (Addendum)
If you are age 80 or older, your body mass index should be between 23-30. Your Body mass index is 21.37 kg/m. If this is out of the aforementioned range listed, please consider follow up with your Primary Care Provider.  We have provided you with a Low FODMAP diet handout. We sent a prescription for Dicyclomine 20 mg to your pharmacy.  Increase fiber to 25-35- grams with use of Fiber supplement which you can get over the counter at the pharmacy, [place], [employer], [employer]. Try for 2 weeks when you are ready.  We have given you an IBGUARD coupon.  This is over the counter. Take 2 tablets twice daily for 4 weeks then continue 1 tab twice daily.  Call our office at [phone] the first week of June for a early July appointment.

## 2017-07-22 ENCOUNTER — Telehealth: Payer: Self-pay

## 2017-07-22 NOTE — Telephone Encounter (Signed)
-----   Message from Levin Erp, [place] sent at 07/21/2017  3:34 PM EDT ----- Regarding: call please Please call patient in 2 weeks to see how she is doing.  Thanks, Ellouise Newer, PA-C

## 2017-07-22 NOTE — Telephone Encounter (Signed)
I spoke with the pt and she states she is doing very well and will call back if symptoms return

## 2017-07-27 ENCOUNTER — Telehealth: Payer: Self-pay | Admitting: Physician Assistant

## 2017-07-27 NOTE — Telephone Encounter (Signed)
Left message on machine to call back  

## 2017-07-28 NOTE — Telephone Encounter (Signed)
Left message on machine to call back  

## 2017-07-29 NOTE — Telephone Encounter (Signed)
Messages have been left for the pt, will await further communication from the pt

## 2017-08-08 ENCOUNTER — Telehealth: Payer: Self-pay | Admitting: Physician Assistant

## 2017-08-08 ENCOUNTER — Encounter: Payer: Self-pay | Admitting: Physician Assistant

## 2017-08-08 MED ORDER — DIPHENOXYLATE-ATROPINE 2.5-0.025 MG PO TABS: ORAL_TABLET | ORAL | 0 refills | Status: DC

## 2017-08-08 NOTE — Telephone Encounter (Signed)
Patient reports that her IBS symptoms have improved some, but she is still having urgency.  She is traveling to [place] on 08/13/17.  She is requesting a letter from [employer] explaining her symptoms so she can try to get her money back.  She is afraid that her symptoms are too severe to fly or travel for fear of an accident.

## 2017-08-08 NOTE — Telephone Encounter (Signed)
Left message for patient to call back  

## 2017-08-08 NOTE — Telephone Encounter (Signed)
Are you going to write the letter?

## 2017-08-08 NOTE — Telephone Encounter (Signed)
I believe I sent it to her. It is in her chart under letters if she needs to pick up a printed copy from [place].   Thanks-JLL

## 2017-08-08 NOTE — Telephone Encounter (Signed)
We can provide her with a note just discussing abdominal pain and frequent bowel movements -being under our care, etc.  If she would like though we can send her in Lomotil 1-2 tabs scheduled q6 hours to try over the next few days-she may not have to cancel trip? Thanks-JLL

## 2017-08-08 NOTE — Telephone Encounter (Signed)
Pt would like to speak with Anderson Malta regarding her sxs.

## 2017-08-08 NOTE — Telephone Encounter (Signed)
Patient notified rx sent to her pharmacy for lomotil.  She will try this.

## 2017-10-17 ENCOUNTER — Emergency Department (HOSPITAL_COMMUNITY): Payer: Medicare Other

## 2017-10-17 ENCOUNTER — Encounter (HOSPITAL_COMMUNITY): Payer: Self-pay

## 2017-10-17 ENCOUNTER — Other Ambulatory Visit: Payer: Self-pay

## 2017-10-17 ENCOUNTER — Emergency Department (HOSPITAL_COMMUNITY)
Admission: EM | Admit: 2017-10-17 | Discharge: 2017-10-17 | Disposition: A | Discharge status: Home / Self Care | Payer: Medicare Other | Attending: Emergency Medicine | Admitting: Emergency Medicine

## 2017-10-17 DIAGNOSIS — R110 Nausea: Secondary | ICD-10-CM | POA: Diagnosis present

## 2017-10-17 DIAGNOSIS — Z7901 Long term (current) use of anticoagulants: Secondary | ICD-10-CM | POA: Diagnosis not present

## 2017-10-17 DIAGNOSIS — Z79899 Other long term (current) drug therapy: Secondary | ICD-10-CM | POA: Diagnosis not present

## 2017-10-17 DIAGNOSIS — I10 Essential (primary) hypertension: Secondary | ICD-10-CM | POA: Insufficient documentation

## 2017-10-17 DIAGNOSIS — R51 Headache: Secondary | ICD-10-CM | POA: Insufficient documentation

## 2017-10-17 DIAGNOSIS — R42 Dizziness and giddiness: Secondary | ICD-10-CM | POA: Insufficient documentation

## 2017-10-17 DIAGNOSIS — E039 Hypothyroidism, unspecified: Secondary | ICD-10-CM | POA: Insufficient documentation

## 2017-10-17 LAB — CBC WITH DIFFERENTIAL/PLATELET
BASOS ABS: 0 10*3/uL (ref 0.0–0.1)
BASOS PCT: 0 %
EOS ABS: 0.1 10*3/uL (ref 0.0–0.7)
Eosinophils Relative: 1 %
HCT: 42.1 % (ref 36.0–46.0)
HEMOGLOBIN: 14 g/dL (ref 12.0–15.0)
Lymphocytes Relative: 17 %
Lymphs Abs: 1.4 10*3/uL (ref 0.7–4.0)
MCH: 30 pg (ref 26.0–34.0)
MCHC: 33.3 g/dL (ref 30.0–36.0)
MCV: 90.1 fL (ref 78.0–100.0)
MONOS PCT: 11 %
Monocytes Absolute: 0.9 10*3/uL (ref 0.1–1.0)
NEUTROS PCT: 71 %
Neutro Abs: 5.6 10*3/uL (ref 1.7–7.7)
Platelets: 567 10*3/uL — ABNORMAL HIGH (ref 150–400)
RBC: 4.67 MIL/uL (ref 3.87–5.11)
RDW: 14.5 % (ref 11.5–15.5)
WBC: 7.9 10*3/uL (ref 4.0–10.5)

## 2017-10-17 LAB — COMPREHENSIVE METABOLIC PANEL
ALBUMIN: 3.7 g/dL (ref 3.5–5.0)
ALK PHOS: 56 U/L (ref 38–126)
ALT: 12 U/L (ref 0–44)
ANION GAP: 9 (ref 5–15)
AST: 21 U/L (ref 15–41)
BUN: 11 mg/dL (ref 8–23)
CALCIUM: 8.7 mg/dL — AB (ref 8.9–10.3)
CO2: 25 mmol/L (ref 22–32)
Chloride: 100 mmol/L (ref 98–111)
Creatinine, Ser: 0.7 mg/dL (ref 0.44–1.00)
GFR calc Af Amer: >60 mL/min (ref >60)
GFR calc non Af Amer: >60 mL/min (ref >60)
GLUCOSE: 97 mg/dL (ref 70–99)
Potassium: 4.3 mmol/L (ref 3.5–5.1)
SODIUM: 134 mmol/L — AB (ref 135–145)
Total Bilirubin: 1 mg/dL (ref 0.3–1.2)
Total Protein: 7.1 g/dL (ref 6.5–8.1)

## 2017-10-17 LAB — URINALYSIS, ROUTINE W REFLEX MICROSCOPIC
BILIRUBIN URINE: NEGATIVE
Glucose, UA: NEGATIVE mg/dL
Hgb urine dipstick: NEGATIVE
Ketones, ur: NEGATIVE mg/dL
Leukocytes, UA: NEGATIVE
NITRITE: NEGATIVE
PH: 6 (ref 5.0–8.0)
Protein, ur: NEGATIVE mg/dL
SPECIFIC GRAVITY, URINE: 1.005 (ref 1.005–1.030)

## 2017-10-17 LAB — TROPONIN I: Troponin I: <0.03 ng/mL (ref <0.03)

## 2017-10-17 LAB — TSH: TSH: 1.925 u[IU]/mL (ref 0.350–4.500)

## 2017-10-17 MED ORDER — ONDANSETRON 4 MG PO TBDP: 4.0000 mg | ORAL_TABLET | Freq: Three times a day (TID) | ORAL | 0 refills | Status: DC | PRN

## 2017-10-17 MED ORDER — ONDANSETRON 4 MG PO TBDP
4.0000 mg | ORAL_TABLET | Freq: Once | ORAL | Status: AC
  Administered 2017-10-17: 4 mg via ORAL
  Filled 2017-10-17: qty 1

## 2017-10-17 MED ORDER — MECLIZINE HCL 12.5 MG PO TABS: 12.5000 mg | ORAL_TABLET | Freq: Three times a day (TID) | ORAL | 0 refills | Status: DC | PRN

## 2017-10-17 NOTE — ED Provider Notes (Signed)
[facility] EMERGENCY DEPARTMENT Provider Note   CSN: [id number] Arrival date & time: 10/17/17  1740     History   Chief Complaint Chief Complaint  Patient presents with  . Nausea    HPI Megan Mercer is a 82 y.o. female.  The history is provided by the patient. No language interpreter was used.  Dizziness  Quality:  Lightheadedness Severity:  Moderate Duration:  2 days Timing:  Constant Progression:  Worsening Chronicity:  New Relieved by:  Nothing Worsened by:  Nothing Ineffective treatments:  None tried Associated symptoms: nausea   Associated symptoms: no blood in stool, no chest pain, no diarrhea, no headaches, no hearing loss, no palpitations, no shortness of breath, no tinnitus, no vision changes and no vomiting   Risk factors: no heart disease     Past Medical History:  Diagnosis Date  . Anemia   . Anxiety   . Arthritis   . Atrial fibrillation ([place])   . Barrett's esophagus   . Blood transfusion without reported diagnosis   . GERD (gastroesophageal reflux disease)   . Glaucoma   . Hiatal hernia   . HLD (hyperlipidemia)   . HTN (hypertension)   . Hypothyroidism   . Internal hemorrhoids   . Iron deficiency anemia   . Peptic ulcer disease with hemorrhage   . UTI (urinary tract infection)     Patient Active Problem List   Diagnosis Date Noted  . Rectal bleeding 09/23/2016  . Internal hemorrhoid 09/23/2016  . Anemia, iron deficiency 05/14/2014    Past Surgical History:  Procedure Laterality Date  . ABDOMINAL HYSTERECTOMY  1984  . APPENDECTOMY  2007  . CATARACT EXTRACTION Bilateral   . CHOLECYSTECTOMY  2007  . COLONOSCOPY    . UPPER GASTROINTESTINAL ENDOSCOPY       OB History   None      Home Medications    Prior to Admission medications   Medication Sig Start Date End Date Taking? Authorizing Provider  apixaban (ELIQUIS) 5 MG TABS tablet Take 5 mg by mouth 2 (two) times daily.   Yes [provider]  Calcium Carbonate  1500 (600 CA) MG TABS Take 1 tablet by mouth as needed.   Yes [provider]  Cholecalciferol (VITAMIN D3) 1000 UNITS CAPS Take by mouth.   Yes [provider]  colestipol (COLESTID) 1 G tablet Take 1 g by mouth 2 (two) times daily.   Yes [provider]  diltiazem (TIAZAC) 240 MG 24 hr capsule Take 240 mg by mouth daily.   Yes [provider]  ezetimibe (ZETIA) 10 MG tablet Take 10 mg by mouth daily.   Yes [provider]  FOLIC ACID PO Take 814 mg by mouth as needed.   Yes [provider]  levothyroxine (SYNTHROID, LEVOTHROID) 75 MCG tablet Take 75 mcg by mouth daily before breakfast.   Yes [provider]  omeprazole (PRILOSEC) 40 MG capsule Take 1 capsule (40 mg total) by mouth 2 (two) times daily. 06/15/17  Yes Pyrtle, Lajuan Lines, MD  propafenone (RYTHMOL) 225 MG tablet Take 225 mg by mouth 2 (two) times daily.   Yes [provider]  raloxifene (EVISTA) 60 MG tablet Take 60 mg by mouth daily.   Yes [provider]  timolol (TIMOPTIC) 0.5 % ophthalmic solution  08/16/15  Yes [provider]  travoprost, benzalkonium, (TRAVATAN) 0.004 % ophthalmic solution Place 1 drop into both eyes at bedtime.   Yes [provider]  diphenoxylate-atropine (LOMOTIL)  2.5-0.025 MG tablet Take one to two tablets four times as needed for diarrhea 08/08/17   Levin Erp, PA  meclizine (ANTIVERT) 12.5 MG tablet Take 1 tablet (12.5 mg total) by mouth 3 (three) times daily as needed for dizziness. 10/17/17   Fransico Meadow, PA-C  metroNIDAZOLE (FLAGYL) 250 MG tablet Take 1 tablet by mouth. 09/07/17   [provider]  ondansetron (ZOFRAN ODT) 4 MG disintegrating tablet Take 1 tablet (4 mg total) by mouth every 8 (eight) hours as needed for nausea or vomiting. 10/17/17   Fransico Meadow, PA-C  ranitidine (ZANTAC) 150 MG capsule Take 1 capsule (150 mg total) by mouth at bedtime. 11/24/16   Pyrtle, Lajuan Lines, MD     Family History Family History  Problem Relation Age of Onset  . Cancer Father        blood cancer, ? type  . Rheum arthritis Father   . Glaucoma Father   . Rheum arthritis Mother   . Rheum arthritis Sister   . Diabetes Sister        TYPE 2  . Heart failure Sister   . Hyperlipidemia Sister   . Colon cancer Neg Hx   . Esophageal cancer Neg Hx   . Pancreatic cancer Neg Hx   . Stomach cancer Neg Hx   . Liver disease Neg Hx     Social History Social History   Tobacco Use  . Smoking status: Never Smoker  . Smokeless tobacco: Never Used  Substance Use Topics  . Alcohol use: Yes    Alcohol/week: 0.0 standard drinks    Comment: rare  . Drug use: No     Allergies   Ace inhibitors; Amoxicillin; Aspirin; Atorvastatin; Crestor  [rosuvastatin calcium]; Iodine; Lipitor  [atorvastatin calcium]; Oxycodone; Oxycodone-acetaminophen; Paroxetine hcl; Sulfa antibiotics; Sulfasalazine; and Codeine   Review of Systems Review of Systems  HENT: Negative for hearing loss and tinnitus.   Respiratory: Negative for shortness of breath.   Cardiovascular: Negative for chest pain and palpitations.  Gastrointestinal: Positive for nausea. Negative for blood in stool, diarrhea and vomiting.  Neurological: Positive for dizziness. Negative for headaches.  All other systems reviewed and are negative.    Physical Exam Updated Vital Signs BP (!) 148/66   Pulse 65   Temp 97.6 F (36.4 C) (Oral)   Resp 15   Ht 5\' 4"  (1.626 m)   Wt 55.8 kg   SpO2 95%   BMI 21.11 kg/m   Physical Exam  Constitutional: She appears well-developed and well-nourished.  HENT:  Head: Normocephalic.  Right Ear: External ear normal.  Left Ear: External ear normal.  Nose: Nose normal.  Mouth/Throat: Oropharynx is clear and moist.  Eyes: Pupils are equal, round, and reactive to light. Conjunctivae and EOM are normal.  Neck: Normal range of motion. Neck supple.  Cardiovascular: Normal rate.  Pulmonary/Chest:  Effort normal.  Abdominal: Soft.  Musculoskeletal: Normal range of motion.  Neurological: She is alert.  Skin: Skin is warm.  Psychiatric: She has a normal mood and affect.  Nursing note and vitals reviewed.    ED Treatments / Results  Labs (all labs ordered are listed, but only abnormal results are displayed) Labs Reviewed  CBC WITH DIFFERENTIAL/PLATELET - Abnormal; Notable for the following components:      Result Value   Platelets 567 (*)    All other components within normal limits  COMPREHENSIVE METABOLIC PANEL - Abnormal; Notable for the following components:   Sodium 134 (*)  Calcium 8.7 (*)    All other components within normal limits  TROPONIN I  URINALYSIS, ROUTINE W REFLEX MICROSCOPIC  TSH    EKG None  Radiology Ct Head Wo Contrast  Result Date: 10/17/2017 CLINICAL DATA:  Headache, acute, with normal neuro exam EXAM: CT HEAD WITHOUT CONTRAST TECHNIQUE: Contiguous axial images were obtained from the base of the skull through the vertex without intravenous contrast. COMPARISON:  10/19/2008 FINDINGS: Brain: No evidence of acute infarction, hemorrhage, hydrocephalus, extra-axial collection or mass lesion/mass effect. Vascular: Atherosclerotic calcification. Skull: Negative Sinuses/Orbits: Bilateral cataract resection. IMPRESSION: Negative head CT. Electronically Signed   By: Monte Fantasia M.D.   On: 10/17/2017 12:59    Procedures Procedures (including critical care time)  Medications Ordered in ED Medications  ondansetron (ZOFRAN-ODT) disintegrating tablet 4 mg (4 mg Oral Given 10/17/17 1017)     Initial Impression / Assessment and Plan / ED Course  I have reviewed the triage vital signs and the nursing notes.  Pertinent labs & imaging results that were available during my care of the patient were reviewed by me and considered in my medical decision making (see chart for details).     MDM   Results reviewed with Dr. Lita Mains.  Dr. Lita Mains in to see and  examine.   Pt advised of results.  Pt advised to try antivert. Zofran for vomiting.  Pt is advised to see her Physician for evaluation this week.   Final Clinical Impressions(s) / ED Diagnoses   Final diagnoses:  Nausea  Vertigo    ED Discharge Orders         Ordered    ondansetron (ZOFRAN ODT) 4 MG disintegrating tablet  Every 8 hours PRN     10/17/17 1516    meclizine (ANTIVERT) 12.5 MG tablet  3 times daily PRN     10/17/17 1516        An After Visit Summary was printed and given to the patient.    Fransico Meadow, [place] 10/17/17 1622    Julianne Rice, MD 10/18/17 [id number]

## 2017-10-17 NOTE — ED Notes (Signed)
Pt returned from CT °

## 2017-10-17 NOTE — Discharge Instructions (Signed)
See your Physician for recheck this week.   °

## 2017-10-17 NOTE — ED Triage Notes (Signed)
Pt is here for increased nausea. Has not thrown up. Also complaining of an intense headache as well as the back of her neck. Is also stating she is in "[place]" She gets confused, but is alert and oriented now

## 2017-10-20 ENCOUNTER — Telehealth: Payer: Self-pay | Admitting: Internal Medicine

## 2017-10-20 MED ORDER — OMEPRAZOLE 40 MG PO CPDR: 40.0000 mg | DELAYED_RELEASE_CAPSULE | Freq: Two times a day (BID) | ORAL | 0 refills | Status: DC

## 2017-10-20 NOTE — Telephone Encounter (Signed)
Rx sent to [facility] as requested by patient.

## 2017-12-28 ENCOUNTER — Other Ambulatory Visit: Payer: Self-pay | Admitting: *Deleted

## 2017-12-28 MED ORDER — OMEPRAZOLE 40 MG PO CPDR: 40.0000 mg | DELAYED_RELEASE_CAPSULE | Freq: Two times a day (BID) | ORAL | 3 refills | Status: DC

## 2018-01-03 ENCOUNTER — Other Ambulatory Visit: Payer: Self-pay | Admitting: *Deleted

## 2018-01-03 MED ORDER — OMEPRAZOLE 40 MG PO CPDR: 40.0000 mg | DELAYED_RELEASE_CAPSULE | Freq: Two times a day (BID) | ORAL | 0 refills | Status: DC

## 2018-04-18 ENCOUNTER — Other Ambulatory Visit: Payer: Self-pay | Admitting: Internal Medicine

## 2018-07-27 ENCOUNTER — Other Ambulatory Visit: Payer: Self-pay | Admitting: Internal Medicine

## 2018-08-16 ENCOUNTER — Telehealth: Payer: Self-pay | Admitting: Internal Medicine

## 2018-08-16 NOTE — Telephone Encounter (Signed)
Patient called to get a refill for omeprazole (PRILOSEC) 40 MG but needs a office visit before refill. Dr. Hilarie Fredrickson schedule is booked. I have scheduled the patient to see a PA for just a refill but would have to come in due to our PA no doing phone visits. Do you think the patient could get a refill with out seeing the PA?

## 2018-08-16 NOTE — Telephone Encounter (Signed)
I would have her go ahead and come in to see PA. Megan Mercer is a very detail oriented patient and often has a lot of questions. She would probably be best served in office. In addition, last note from Megan Newer, PA-C over 1 year ago indicates the following... "Patient to follow in clinic with me or Dr. Hilarie Fredrickson in 4-6 weeks or sooner if necessary."  Thanks

## 2018-08-31 ENCOUNTER — Telehealth: Payer: Self-pay | Admitting: Internal Medicine

## 2018-08-31 ENCOUNTER — Ambulatory Visit: Payer: Medicare Other | Admitting: Nurse Practitioner

## 2018-08-31 MED ORDER — OMEPRAZOLE 40 MG PO CPDR: 40.0000 mg | DELAYED_RELEASE_CAPSULE | Freq: Two times a day (BID) | ORAL | 0 refills | Status: DC

## 2018-08-31 NOTE — Telephone Encounter (Signed)
Pt just made an appt with Dr. Hilarie Fredrickson on 8/25 to refill meds, she will be out of omeprazole by then so she is requesting if he can refill med one more time before her appt. Pls send prescription to [facility].

## 2018-08-31 NOTE — Telephone Encounter (Signed)
Rx sent until appointment. 

## 2018-10-17 ENCOUNTER — Ambulatory Visit (INDEPENDENT_AMBULATORY_CARE_PROVIDER_SITE_OTHER): Payer: Medicare Other | Admitting: Internal Medicine

## 2018-10-17 ENCOUNTER — Other Ambulatory Visit: Payer: Self-pay

## 2018-10-17 ENCOUNTER — Encounter: Payer: Self-pay | Admitting: Internal Medicine

## 2018-10-17 VITALS — Ht 64.0 in | Wt 126.0 lb

## 2018-10-17 DIAGNOSIS — K219 Gastro-esophageal reflux disease without esophagitis: Secondary | ICD-10-CM

## 2018-10-17 DIAGNOSIS — D509 Iron deficiency anemia, unspecified: Secondary | ICD-10-CM

## 2018-10-17 DIAGNOSIS — R1013 Epigastric pain: Secondary | ICD-10-CM | POA: Diagnosis not present

## 2018-10-17 NOTE — Progress Notes (Signed)
Subjective:    Patient ID: Megan Mercer, female    DOB: [birth year], 83 y.o.   MRN: [id number]  This service was provided via telemedicine.  Telephone encounter The patient was located at home The provider was located in provider's GI office. The patient did consent to this telephone visit and is aware of possible charges through their insurance for this visit.   The persons participating in this telemedicine service were the patient and I. Time spent on call: 21 minutes   HPI Megan Mercer is an 83 year old female with a past medical history of iron deficiency anemia, remote Barrett's esophagus, GERD, small hiatal hernia, history of peptic ulcer disease, atrial fibrillation, hypertension, hyperlipidemia and hypothyroidism who is here for follow-up.  She is seen virtually today in the setting of COVID-19.  She was last seen in our office in May 2019.  She reports that for the longest time she felt like she was doing so well with her stomach.  She had been taking omeprazole twice daily but then backed off to once a day.  However in the last several months she has had worsening of her epigastric pain and she increased the Prilosec back to 40 mg twice daily.  She reports this seems to be worse if she is nervous or stressed out.  She has had cardiac testing.  She does experience regurgitation and epigastric soreness and discomfort.  She reports it feels like there is a "wound" in her epigastrium.  She has not had dysphagia or odynophagia.  She has some mild nausea but no vomiting.  She reports her bowel movements for the most part have been regular and she has not seen blood in her stool or melena.  She reports she has been seen by hematology and was found to be slightly more anemic.  She reports her hemoglobin was in between 10 and 11 g/dL.  She was started on oral ferrous fumarate in the morning and she noticed that this would upset her stomach.  She tried using it with food and had similar  epigastric discomfort.  She used this for 2 to 3 months but for the last week or 2 has stopped using oral iron.  She reports the hematologist had previously recommended IV iron and she is more open to this at this point.  Review of Systems As per HPI, otherwise negative  Current Medications, Allergies, Past Medical History, Past Surgical History, Family History and Social History were reviewed in [employer] record.     Objective:   Physical Exam No physical exam today, virtual visit  The labs from hematology or not visible to me by care everywhere.  Her hematologist is in the [place] system in [place].      Assessment & Plan:   83 year old female with a past medical history of iron deficiency anemia, remote Barrett's esophagus, GERD, small hiatal hernia, history of peptic ulcer disease, atrial fibrillation, hypertension, hyperlipidemia and hypothyroidism who is here for follow-up.   1.  Epigastric pain --she has a history of epigastric pain and GERD.  She had been doing well and I wonder if the oral iron has not caused some GI upset/dyspepsia.  She has been using omeprazole but taking the second dose at bedtime rather than before meals.  I recommended the following --Continue omeprazole 40 mg twice daily but ensure that she is using the dose before her first and last meal of the day --Add Carafate back 1 g tablet 3 times  daily AC x2 weeks; please call this to [place] near her home --Stop oral iron and talk to her hematologist about IV iron given GI intolerance --Follow-up office visit with me in late October by virtual visit; if persisting pain we could consider repeat upper endoscopy  2.  IDA --long history of them felt likely secondary to poor absorption.  I recommended she stop oral iron due to GI intolerance and discuss IV iron when she follows up with her hematologist early next month.  She will likely need repeat CBC and iron studies and if iron  remains low then IV iron as needed

## 2018-10-18 MED ORDER — SUCRALFATE 1 GM/10ML PO SUSP: 1.0000 g | Freq: Three times a day (TID) | ORAL | 0 refills | Status: DC

## 2018-10-18 NOTE — Addendum Note (Signed)
Addended by: Larina Bras on: 10/18/2018 08:37 AM   Modules accepted: Orders

## 2018-10-18 NOTE — Patient Instructions (Addendum)
Continue omeprazole 40 mg twice daily but make sure that you are using it before your first and last meal of the day.  Start back on Carafate 1 g tablet 3 times daily before meals x 2 weeks; this has been sent to [place].  Stop oral iron and talk to your hematologist about IV iron given your GI intolerance.  Please schedule an office visit late October by virtual visit; if persisting pain we could consider repeat upper endoscopy.  If you are age 81 or older, your body mass index should be between 23-30. Your Body mass index is 21.63 kg/m. If this is out of the aforementioned range listed, please consider follow up with your Primary Care Provider.  If you are age 32 or younger, your body mass index should be between 19-25. Your Body mass index is 21.63 kg/m. If this is out of the aformentioned range listed, please consider follow up with your Primary Care Provider.

## 2018-10-21 ENCOUNTER — Emergency Department (HOSPITAL_COMMUNITY)
Admission: EM | Admit: 2018-10-21 | Discharge: 2018-10-21 | Disposition: A | Discharge status: Home / Self Care | Payer: Medicare Other | Attending: Emergency Medicine | Admitting: Emergency Medicine

## 2018-10-21 ENCOUNTER — Encounter (HOSPITAL_COMMUNITY): Payer: Self-pay | Admitting: Emergency Medicine

## 2018-10-21 ENCOUNTER — Other Ambulatory Visit: Payer: Self-pay

## 2018-10-21 ENCOUNTER — Emergency Department (HOSPITAL_COMMUNITY): Payer: Medicare Other

## 2018-10-21 DIAGNOSIS — I10 Essential (primary) hypertension: Secondary | ICD-10-CM | POA: Diagnosis not present

## 2018-10-21 DIAGNOSIS — Z7901 Long term (current) use of anticoagulants: Secondary | ICD-10-CM | POA: Diagnosis not present

## 2018-10-21 DIAGNOSIS — I4891 Unspecified atrial fibrillation: Secondary | ICD-10-CM | POA: Diagnosis not present

## 2018-10-21 DIAGNOSIS — Z79899 Other long term (current) drug therapy: Secondary | ICD-10-CM | POA: Insufficient documentation

## 2018-10-21 DIAGNOSIS — E039 Hypothyroidism, unspecified: Secondary | ICD-10-CM | POA: Insufficient documentation

## 2018-10-21 DIAGNOSIS — R519 Headache, unspecified: Secondary | ICD-10-CM

## 2018-10-21 DIAGNOSIS — R51 Headache: Secondary | ICD-10-CM | POA: Insufficient documentation

## 2018-10-21 LAB — COMPREHENSIVE METABOLIC PANEL
ALT: 17 U/L (ref 0–44)
AST: 22 U/L (ref 15–41)
Albumin: 4.1 g/dL (ref 3.5–5.0)
Alkaline Phosphatase: 66 U/L (ref 38–126)
Anion gap: 10 (ref 5–15)
BUN: 11 mg/dL (ref 8–23)
CO2: 25 mmol/L (ref 22–32)
Calcium: 8.7 mg/dL — ABNORMAL LOW (ref 8.9–10.3)
Chloride: 96 mmol/L — ABNORMAL LOW (ref 98–111)
Creatinine, Ser: 0.64 mg/dL (ref 0.44–1.00)
GFR calc Af Amer: >60 mL/min (ref >60)
GFR calc non Af Amer: >60 mL/min (ref >60)
Glucose, Bld: 96 mg/dL (ref 70–99)
Potassium: 3.2 mmol/L — ABNORMAL LOW (ref 3.5–5.1)
Sodium: 131 mmol/L — ABNORMAL LOW (ref 135–145)
Total Bilirubin: 0.9 mg/dL (ref 0.3–1.2)
Total Protein: 7.4 g/dL (ref 6.5–8.1)

## 2018-10-21 LAB — CBC
HCT: 42.6 % (ref 36.0–46.0)
Hemoglobin: 14 g/dL (ref 12.0–15.0)
MCH: 29.9 pg (ref 26.0–34.0)
MCHC: 32.9 g/dL (ref 30.0–36.0)
MCV: 91 fL (ref 80.0–100.0)
Platelets: 760 10*3/uL — ABNORMAL HIGH (ref 150–400)
RBC: 4.68 MIL/uL (ref 3.87–5.11)
RDW: 13.6 % (ref 11.5–15.5)
WBC: 12.4 10*3/uL — ABNORMAL HIGH (ref 4.0–10.5)
nRBC: 0 % (ref 0.0–0.2)

## 2018-10-21 LAB — LIPASE, BLOOD: Lipase: 31 U/L (ref 11–51)

## 2018-10-21 MED ORDER — SODIUM CHLORIDE 0.9 % IV BOLUS
500.0000 mL | Freq: Once | INTRAVENOUS | Status: AC
  Administered 2018-10-21: 500 mL via INTRAVENOUS

## 2018-10-21 MED ORDER — DIPHENHYDRAMINE HCL 50 MG/ML IJ SOLN
12.5000 mg | Freq: Once | INTRAMUSCULAR | Status: AC
  Administered 2018-10-21: 14:00:00 12.5 mg via INTRAVENOUS
  Filled 2018-10-21: qty 1

## 2018-10-21 MED ORDER — METOCLOPRAMIDE HCL 5 MG/ML IJ SOLN
5.0000 mg | Freq: Once | INTRAMUSCULAR | Status: AC
  Administered 2018-10-21: 5 mg via INTRAVENOUS
  Filled 2018-10-21: qty 2

## 2018-10-21 MED ORDER — POTASSIUM CHLORIDE CRYS ER 20 MEQ PO TBCR: 20.0000 meq | EXTENDED_RELEASE_TABLET | Freq: Every day | ORAL | 0 refills | Status: DC

## 2018-10-21 MED ORDER — MAGNESIUM SULFATE IN D5W 1-5 GM/100ML-% IV SOLN
1.0000 g | Freq: Once | INTRAVENOUS | Status: AC
  Administered 2018-10-21: 1 g via INTRAVENOUS
  Filled 2018-10-21: qty 100

## 2018-10-21 MED ORDER — POTASSIUM CHLORIDE CRYS ER 20 MEQ PO TBCR
40.0000 meq | EXTENDED_RELEASE_TABLET | Freq: Once | ORAL | Status: AC
  Administered 2018-10-21: 40 meq via ORAL
  Filled 2018-10-21: qty 2

## 2018-10-21 MED ORDER — LIDOCAINE 5 % EX PTCH: 1.0000 | MEDICATED_PATCH | CUTANEOUS | 0 refills | Status: DC

## 2018-10-21 NOTE — ED Triage Notes (Signed)
Megan Mercer has multiple complaints. Megan Mercer c/o and most concerned with severe headache and left ear pain x2 weeks ago. Megan Mercer had 3 teeth extracted 2 weeks ago from left side. Megan Mercer also c/o abd pain, nausea, and indigestion. Per Megan Mercer has hx of GI issues and goes to gastroenterologist. Megan Mercer states seen at [facility] 2 days ago and was admitted. Megan Mercer released yesterday. Megan Mercer states was admitted for observation due to hypertension and ? Blood clot in heart related to a-fib after having work up for indigestion. Megan Mercer was supposed to have CT and cardiologist consult but cardiologist canceled the CT.

## 2018-10-21 NOTE — ED Provider Notes (Signed)
[facility] EMERGENCY DEPARTMENT Provider Note   CSN: [id number] Arrival date & time: 10/21/18  1121     History   Chief Complaint Chief Complaint  Patient presents with   Headache    HPI Megan Mercer is a 83 y.o. female with a hx of anemia, anxiety, afib on Eliquis, GERD, HTN, hyperlipidemia, & hypothyroidism who presents to the ED w/ complaints of headache x 4-5 days. Patient states headache had gradual onset with steady progression & is now fairly severe @ a 9/10. No thunderclap/sudden onset. Describes discomfort as aching to the generalized head into the neck, she states that she feels she needs a neck massage. States pain is worse with trying to watch TV, no alleviating factors, tried tylenol w/o much change. She had onset of L ear pain around the same time as onset of headache- maybe a bit before- feels there is something in her ear. She also mentions she had 3 teeth extracted from her L upper gum line about 2-3 weeks ago, but was generally doing well following the procedure. Has had some congestion. Denies visual disturbance, numbness, weakness, facial droop, speech change, dizziness like the room spinning, passing out, or head injury. Denies fever, chills, sore throat, or cough.   She mentions that she also has some epigastric abdominal discomfort that is aching in nature, worse with eating, and has been occurring for awhile now & is not acutely changed today. She is followed by GI for this. Most recent office visit note reviewed from 10/17/18 had complaints of this- thought to possibly be some upset from her iron supplement which was stopped, also thought to be related to improper use of omeprazole- was educated, & was started on carafate w/ plans for possible repeat upper endoscopy if she is not improved @ follow up in October of this year. Denies hematemesis, melena, or hematochezia.   Patient also mentions she was recently seen @ [facility] ER, she presented with multiple  complaints & she feels her headache was not fully addressed because they were worried about her heart and electrolytes. Per her discharge summary which she has at the bedside it appears she had an NSTEMI, electrolyte disturbance, & a UTI. She also was noted to have a pulmonary nodule- not a "blood clot in her heart" as she had stated to triage and was told she would need an outpatient CT for this. She was discharged w/ reduced dose of Eliquis, imdur, & lasix  As well as cefdinir for her UTI. She states she has been taking all medicines as prescribed. She denies chest pain, dyspnea, nausea, vomiting, diaphoresis, or syncope. Denies urinary sxs.   HPI  Past Medical History:  Diagnosis Date   Anemia    Anxiety    Arthritis    Atrial fibrillation ([place])    Barrett's esophagus    Blood transfusion without reported diagnosis    GERD (gastroesophageal reflux disease)    Glaucoma    Hiatal hernia    HLD (hyperlipidemia)    HTN (hypertension)    Hypothyroidism    Internal hemorrhoids    Iron deficiency anemia    Peptic ulcer disease with hemorrhage    UTI (urinary tract infection)     Patient Active Problem List   Diagnosis Date Noted   Rectal bleeding 09/23/2016   Internal hemorrhoid 09/23/2016   Anemia, iron deficiency 05/14/2014    Past Surgical History:  Procedure Laterality Date   ABDOMINAL HYSTERECTOMY  1984   APPENDECTOMY  2007  CATARACT EXTRACTION Bilateral    CHOLECYSTECTOMY  2007   COLONOSCOPY     UPPER GASTROINTESTINAL ENDOSCOPY       OB History   No obstetric history on file.      Home Medications    Prior to Admission medications   Medication Sig Start Date End Date Taking? Authorizing Provider  apixaban (ELIQUIS) 5 MG TABS tablet Take 5 mg by mouth 2 (two) times daily.    [provider]  Calcium Carbonate 1500 (600 CA) MG TABS Take 1 tablet by mouth as needed.    [provider]  Cholecalciferol (VITAMIN D3) 1000  UNITS CAPS Take 1 capsule by mouth as needed.     [provider]  colestipol (COLESTID) 1 G tablet Take 1 g by mouth 2 (two) times daily.    [provider]  diltiazem (TIAZAC) 240 MG 24 hr capsule Take 240 mg by mouth daily.    [provider]  ezetimibe (ZETIA) 10 MG tablet Take 10 mg by mouth daily.    [provider]  FOLIC ACID PO Take 0000000 mg by mouth as needed.    [provider]  levothyroxine (SYNTHROID, LEVOTHROID) 75 MCG tablet Take 75 mcg by mouth daily before breakfast.    [provider]  omeprazole (PRILOSEC) 40 MG capsule Take 1 capsule (40 mg total) by mouth 2 (two) times daily. 08/31/18   Pyrtle, Lajuan Lines, MD  propafenone (RYTHMOL) 225 MG tablet Take 225 mg by mouth 2 (two) times daily.    [provider]  raloxifene (EVISTA) 60 MG tablet Take 60 mg by mouth daily.    [provider]  sucralfate (CARAFATE) 1 GM/10ML suspension Take 10 mLs (1 g total) by mouth 3 (three) times daily before meals. Pharmacy-if insurance requires tablets, ok to change, just have pt make into slurry 10/18/18   Pyrtle, Lajuan Lines, MD  timolol (TIMOPTIC) 0.5 % ophthalmic solution  08/16/15   [provider]  travoprost, benzalkonium, (TRAVATAN) 0.004 % ophthalmic solution Place 1 drop into both eyes at bedtime.    [provider]    Family History Family History  Problem Relation Age of Onset   Cancer Father        blood cancer, ? type   Rheum arthritis Father    Glaucoma Father    Rheum arthritis Mother    Arthritis Sister    Diabetes Sister        TYPE 2   Heart failure Sister    Hyperlipidemia Sister    Colon cancer Neg Hx    Esophageal cancer Neg Hx    Pancreatic cancer Neg Hx    Stomach cancer Neg Hx    Liver disease Neg Hx     Social History Social History   Tobacco Use   Smoking status: Never Smoker   Smokeless tobacco: Never Used  Substance Use Topics   Alcohol use: Yes     Alcohol/week: 0.0 standard drinks    Comment: rare   Drug use: No    Allergies   Ace inhibitors, Amoxicillin, Aspirin, Atorvastatin, Crestor  [rosuvastatin calcium], Iodine, Lipitor  [atorvastatin calcium], Oxycodone, Oxycodone-acetaminophen, Paroxetine hcl, Sulfa antibiotics, Sulfasalazine, and Codeine   Review of Systems Review of Systems  Constitutional: Negative for chills and fever.  HENT: Positive for congestion and ear pain. Negative for ear discharge, sore throat, trouble swallowing and voice change.   Eyes: Negative for visual disturbance.  Respiratory: Negative for shortness of breath.   Cardiovascular:  Negative for chest pain.  Gastrointestinal: Positive for abdominal pain. Negative for blood in stool, constipation, diarrhea, nausea and vomiting.  Genitourinary: Negative for dysuria.  Neurological: Positive for headaches. Negative for dizziness, tremors, seizures, syncope, speech difficulty, weakness and numbness.  All other systems reviewed and are negative.    Physical Exam Updated Vital Signs BP (!) 148/91 (BP Location: Right Arm)    Pulse 85    Temp 97.8 F (36.6 C) (Oral)    Resp 20    Ht 5\' 4"  (1.626 m)    Wt 54.4 kg    SpO2 99%    BMI 20.60 kg/m   Physical Exam Vitals signs and nursing note reviewed.  Constitutional:      General: She is not in acute distress.    Appearance: She is well-developed.  HENT:     Head: Normocephalic and atraumatic.     Right Ear: External ear normal. Tympanic membrane is not perforated, erythematous, retracted or bulging.     Left Ear: External ear normal. Tympanic membrane is not perforated, erythematous, retracted or bulging.     Ears:     Comments: EACs with mild cerumen present which is non-obstructing. No canal edema/erythema or FBs.  No mastoid erythema/swelling/tenderness.     Nose: Congestion present.     Right Sinus: Frontal sinus tenderness present. No maxillary sinus tenderness.     Left Sinus: Frontal sinus  tenderness present. No maxillary sinus tenderness.     Mouth/Throat:     Mouth: Mucous membranes are moist.     Pharynx: Uvula midline. No oropharyngeal exudate or posterior oropharyngeal erythema.     Comments: Left upper gingiva w/ changes consistent w/ prior tooth extraction. No palpable fluctuance or purulent drainage. No significant tenderness to palpation.  Posterior oropharynx is symmetric appearing. Patient tolerating own secretions without difficulty. No trismus. No drooling. No hot potato voice. No swelling beneath the tongue, submandibular compartment is soft.  Eyes:     General:        Right eye: No discharge.        Left eye: No discharge.     Extraocular Movements: Extraocular movements intact.     Conjunctiva/sclera: Conjunctivae normal.     Pupils: Pupils are equal, round, and reactive to light.  Neck:     Musculoskeletal: Normal range of motion and neck supple. Muscular tenderness present. No edema or neck rigidity.  Cardiovascular:     Rate and Rhythm: Normal rate and regular rhythm.     Heart sounds: No murmur.  Pulmonary:     Effort: Pulmonary effort is normal. No respiratory distress.     Breath sounds: Normal breath sounds. No wheezing, rhonchi or rales.  Abdominal:     General: There is no distension.     Palpations: Abdomen is soft.     Tenderness: There is abdominal tenderness (mild) in the epigastric area. There is no right CVA tenderness, left CVA tenderness, guarding or rebound. Negative signs include Murphy's sign.  Lymphadenopathy:     Cervical: No cervical adenopathy.  Skin:    General: Skin is warm and dry.     Findings: No rash.  Neurological:     Comments: Alert. Clear speech. No facial droop. CNIII-XII grossly intact. Bilateral upper and lower extremities' sensation grossly intact. 5/5 symmetric strength with grip strength and with plantar and dorsi flexion bilaterally.Normal finger to nose bilaterally. Negative pronator drift. Negative Romberg sign.  Gait is steady and intact.   Psychiatric:  Behavior: Behavior normal.    ED Treatments / Results  Labs (all labs ordered are listed, but only abnormal results are displayed) Labs Reviewed  CBC - Abnormal; Notable for the following components:      Result Value   WBC 12.4 (*)    Platelets 760 (*)    All other components within normal limits  COMPREHENSIVE METABOLIC PANEL - Abnormal; Notable for the following components:   Sodium 131 (*)    Potassium 3.2 (*)    Chloride 96 (*)    Calcium 8.7 (*)    All other components within normal limits  LIPASE, BLOOD    EKG None  Radiology Ct Head Wo Contrast  Result Date: 10/21/2018 CLINICAL DATA:  Severe headache and left ear pain for the past 2 weeks. Patient had 3 left-sided teeth extracted 2 weeks ago. EXAM: CT HEAD WITHOUT CONTRAST CT CERVICAL SPINE WITHOUT CONTRAST TECHNIQUE: Multidetector CT imaging of the head and cervical spine was performed following the standard protocol without intravenous contrast. Multiplanar CT image reconstructions of the cervical spine were also generated. COMPARISON:  CT head dated October 17, 2017. FINDINGS: CT HEAD FINDINGS Brain: No evidence of acute infarction, hemorrhage, hydrocephalus, extra-axial collection or mass lesion/mass effect. Vascular: Atherosclerotic vascular calcification of the carotid siphons. No hyperdense vessel. Skull: Normal. Negative for fracture or focal lesion. Sinuses/Orbits: No acute finding. Other: None. CT CERVICAL SPINE FINDINGS Alignment: Straightening of the normal cervical lordosis. Facet mediated 3 mm anterolisthesis at C7-T1. Skull base and vertebrae: No acute fracture. No primary bone lesion or focal pathologic process. Soft tissues and spinal canal: No prevertebral fluid or swelling. No visible canal hematoma. Disc levels: Moderate to severe disc height loss and uncovertebral hypertrophy at C5-C6 and C6-C7. Scattered moderate facet arthropathy. Upper chest: Negative.  Other: None. IMPRESSION: 1.  No acute intracranial abnormality. 2. No acute cervical spine fracture. Moderate to severe spondylosis at C5-C6 and C6-C7. Electronically Signed   By: Titus Dubin M.D.   On: 10/21/2018 14:01   Ct Cervical Spine Wo Contrast  Result Date: 10/21/2018 CLINICAL DATA:  Severe headache and left ear pain for the past 2 weeks. Patient had 3 left-sided teeth extracted 2 weeks ago. EXAM: CT HEAD WITHOUT CONTRAST CT CERVICAL SPINE WITHOUT CONTRAST TECHNIQUE: Multidetector CT imaging of the head and cervical spine was performed following the standard protocol without intravenous contrast. Multiplanar CT image reconstructions of the cervical spine were also generated. COMPARISON:  CT head dated October 17, 2017. FINDINGS: CT HEAD FINDINGS Brain: No evidence of acute infarction, hemorrhage, hydrocephalus, extra-axial collection or mass lesion/mass effect. Vascular: Atherosclerotic vascular calcification of the carotid siphons. No hyperdense vessel. Skull: Normal. Negative for fracture or focal lesion. Sinuses/Orbits: No acute finding. Other: None. CT CERVICAL SPINE FINDINGS Alignment: Straightening of the normal cervical lordosis. Facet mediated 3 mm anterolisthesis at C7-T1. Skull base and vertebrae: No acute fracture. No primary bone lesion or focal pathologic process. Soft tissues and spinal canal: No prevertebral fluid or swelling. No visible canal hematoma. Disc levels: Moderate to severe disc height loss and uncovertebral hypertrophy at C5-C6 and C6-C7. Scattered moderate facet arthropathy. Upper chest: Negative. Other: None. IMPRESSION: 1.  No acute intracranial abnormality. 2. No acute cervical spine fracture. Moderate to severe spondylosis at C5-C6 and C6-C7. Electronically Signed   By: Titus Dubin M.D.   On: 10/21/2018 14:01    Procedures Procedures (including critical care time)  Medications Ordered in ED Medications  sodium chloride 0.9 % bolus 500 mL (0 mLs Intravenous  Stopped 10/21/18 1450)  diphenhydrAMINE (BENADRYL) injection 12.5 mg (12.5 mg Intravenous Given 10/21/18 1342)  metoCLOPramide (REGLAN) injection 5 mg (5 mg Intravenous Given 10/21/18 1341)  potassium chloride SA (K-DUR) CR tablet 40 mEq (40 mEq Oral Given 10/21/18 1457)  magnesium sulfate IVPB 1 g 100 mL (0 g Intravenous Stopped 10/21/18 1514)    Initial Impression / Assessment and Plan / ED Course  I have reviewed the triage vital signs and the nursing notes.  Pertinent labs & imaging results that were available during my care of the patient were reviewed by me and considered in my medical decision making (see chart for details).   Patient presents to the ED w/ primary complaint of headache x 4-5 days. She also mentions variety of other medical issues including chronic epigastric pain & recent medical admission to [facility] for what appears to have been for an NSTEMI, hyponatremia, & UTI. Patient is nontoxic appearing & in no apparent distress. Vitals WNL with the exception of mildly elevated BP. Will plan for CT head/neck & abdominal labs w/ trial of a mild version of migraine cocktail.   Work-up reviewed:  CBC: mild leukocytosis felt to be most likely nonspecific- is receiving tx for UTI currently this may be underlying etiology. No anemia. Thrombocytosis mildly elevated compared w/ prior.  CMP: Mild electrolyte abnormalities as above. Receiving NS in the ED, will be given oral potassium.  Lipase: WNL  CT head/C-spine:  1.  No acute intracranial abnormality. 2. No acute cervical spine fracture. Moderate to severe spondylosis at C5-C6 and C6-C7.   Headache:. Patient has hx of similar headaches, gradual onset with steady progression in severity, she is afebrile w/o nuchal rigidity, no dizziness or visual disturbance, no neuro deficits, negative head CT- doubt SAH, ICH, ischemic CVA, dural venous sinus thrombosis, acute glaucoma, giant cell arteritis, mass, or meningitis. Very mild  improvement w/ ED interventions, given magnesium & potassium following migraine cocktail and subsequently requesting to go home. Unclear definitive cause- this may be a tension headache related to paraspinal muscle tension in cervical region as she has mild tenderness and states she needs a massage- trial lidocaine patches at home w/ application of heat. Also considered sinusitis given some nasal congestion & sinus tenderness w/ ear pain- she is currently on cefdinir for UTI therefore would expect this to be decent coverage. Ear exam w/o signs of mastoiditis, AOM, or EOM. No meningismus. No signs of dental infection. Overall appears appropriate for discharge home with PCP follow up, continue cefdinir, start lidoderm patches & heat application.   Regarding her additional concerns:  - Epigastric pain- followed by GI- seen 08/25, medication regimen altered, plan for upper endoscopy in October if not improved; minimal tenderness on exam today, no peritoneal signs to suggest acute surgical abdomen. No signs of significant acute upper GI bleed ( no melena/hematochezia, hgb/hct WNL, BUN not elevated). Follow up with GI as scheduled - Recent hospital admission- NSTEMI- no chest pain/dyspnea/diaphoresis/N/V. UTI- on cefdinir- no urinary sxs, hyponatremia- mild @ 131 today- received NS.    Patient requesting discharge home- will DC w/ lidoderm patches & short course of potassium w/ diet recommendations. Follow up w/ PCP & GI doctor. I discussed results, treatment plan, need for follow-up, and return precautions with the patient. Provided opportunity for questions, patient confirmed understanding and is in agreement with plan.   Findings and plan of care discussed with supervising physician Dr. Thurnell Garbe who is in agreement.   Final Clinical Impressions(s) / ED Diagnoses  Final diagnoses:  Bad headache    ED Discharge Orders         Ordered    lidocaine (LIDODERM) 5 %  Every 24 hours     10/21/18 1459     potassium chloride SA (K-DUR) 20 MEQ tablet  Daily     10/21/18 1500           Koraline Phillipson, Glynda Jaeger, PA-C 10/21/18 [place], [place], DO 10/26/18 1542

## 2018-10-21 NOTE — Discharge Instructions (Signed)
You were seen in the emergency department today for a headache.  Your labs were overall reassuring.  Your potassium, sodium, calcium, and chloride were all a bit low.  We gave you medicines in the ER to help with this.  We are also sending home with a few days of a potassium supplement.  We are also sending you home with Lidoderm patches, apply 1 patch to the back of your neck and your area of discomfort once per day and removed within 12 hours.  We have prescribed you new medication(s) today. Discuss the medications prescribed today with your pharmacist as they can have adverse effects and interactions with your other medicines including over the counter and prescribed medications. Seek medical evaluation if you start to experience new or abnormal symptoms after taking one of these medicines, seek care immediately if you start to experience difficulty breathing, feeling of your throat closing, facial swelling, or rash as these could be indications of a more serious allergic reaction  Please apply heat to your neck as well.  Continue to take Tylenol per over-the-counter dosing.  Follow-up with primary care within 3 days with your GI doctor within the week.  Return to the ER for new or worsening symptoms including but not limited to visual disturbance, numbness, weakness, passing out, dizziness like the room spinning, or any other concerns.

## 2018-10-31 ENCOUNTER — Other Ambulatory Visit: Payer: Self-pay | Admitting: Internal Medicine

## 2018-11-07 ENCOUNTER — Telehealth: Payer: Self-pay

## 2018-11-07 DIAGNOSIS — K869 Disease of pancreas, unspecified: Secondary | ICD-10-CM

## 2018-11-07 NOTE — Telephone Encounter (Signed)
Received urgent referral from Dr. Harmon Pier, abnormal CT, lesion on pancreas. Called pt to schedule appt with Nicoletta Ba PA this Thursday but pt states she does not need to see a PA. States she is calling [facility]'s office back to get them to order MRI that he mentioned and she will call our office back after that. Records in Dr. Vena Rua box for review.

## 2018-11-07 NOTE — Telephone Encounter (Signed)
Pt requested order for MRI to be sent to [facility] in [place], [place] prior to scheduling and appt with GI.

## 2018-11-07 NOTE — Telephone Encounter (Signed)
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

## 2018-11-07 NOTE — Telephone Encounter (Signed)
Please see note below. Pt requesting we order MRI in [place]. Please advise.

## 2018-11-08 NOTE — Telephone Encounter (Signed)
Detailed message left for pt on voicemail as she returned the call and left another message.

## 2018-11-08 NOTE — Telephone Encounter (Signed)
Noted  

## 2018-11-08 NOTE — Telephone Encounter (Signed)
Pt scheduled for MR of abd with and without at [facility] 11/14/18@9am , pt to register at 8:30am and be NPO after midnight. Left message for pt to call back.

## 2018-11-08 NOTE — Telephone Encounter (Signed)
Pt called to confirm that she has received messages regarding scheduled MRI at [facility] 9/22.

## 2018-11-09 ENCOUNTER — Ambulatory Visit: Payer: Medicare Other | Admitting: Physician Assistant

## 2018-11-10 ENCOUNTER — Telehealth: Payer: Self-pay

## 2018-11-10 NOTE — Telephone Encounter (Signed)
Order faxed to [facility] at [phone] for CT.

## 2018-11-10 NOTE — Telephone Encounter (Signed)
Would proceed with the dedicated CT, pancreas protocol as recommended by radiology May need EUS but given that this is a sedated procedure would proceed with CT first

## 2018-11-10 NOTE — Telephone Encounter (Signed)
Received phone call from radiology in [place] and they were doing prescreening for pts MRI next week. Pt had a feraheme infusion 3 days ago, now she cannot have the mri for at least 2 months. Radiologist there recommending CT of abd w and w/o with delays. Please advise.

## 2018-11-11 ENCOUNTER — Other Ambulatory Visit: Payer: Self-pay | Admitting: Internal Medicine

## 2018-11-15 ENCOUNTER — Telehealth: Payer: Self-pay | Admitting: Internal Medicine

## 2018-11-15 NOTE — Telephone Encounter (Signed)
Dr. Hilarie Fredrickson reviewed CT results. Plan is to call pt on Monday at 1pm, appt in epic, to discuss results and plan.Pt will likely need EUS, lesion is small and has not spread. Pt aware and knows to expect phone call Monday at 1pm.

## 2018-11-20 ENCOUNTER — Ambulatory Visit (INDEPENDENT_AMBULATORY_CARE_PROVIDER_SITE_OTHER): Payer: Medicare Other | Admitting: Internal Medicine

## 2018-11-20 ENCOUNTER — Telehealth: Payer: Self-pay

## 2018-11-20 ENCOUNTER — Other Ambulatory Visit: Payer: Self-pay

## 2018-11-20 DIAGNOSIS — K869 Disease of pancreas, unspecified: Secondary | ICD-10-CM

## 2018-11-20 DIAGNOSIS — G8929 Other chronic pain: Secondary | ICD-10-CM

## 2018-11-20 DIAGNOSIS — R110 Nausea: Secondary | ICD-10-CM | POA: Diagnosis not present

## 2018-11-20 DIAGNOSIS — Z8639 Personal history of other endocrine, nutritional and metabolic disease: Secondary | ICD-10-CM

## 2018-11-20 DIAGNOSIS — R1013 Epigastric pain: Secondary | ICD-10-CM

## 2018-11-20 NOTE — H&P (View-Only) (Signed)
Subjective:    Patient ID: Megan Mercer, female    DOB: Nov 15, 1935, 83 y.o.   MRN: [id number]  This service was provided via telemedicine.   Telephone encounter The patient was located at home The provider was located at [facility]. The patient did consent to this telephone visit and is aware of possible charges through their insurance for this visit.   The persons participating in this telemedicine service were the patient and I. Time spent on call: 14 min   HPI Megan Mercer is an 83 year old female with a history of IDA, remote Barrett's esophagus, GERD, small hiatal hernia, history of peptic ulcer disease, atrial fibrillation, hypertension, hyperlipidemia, hypothyroidism who is seen in follow-up after a recently abnormal CT scan of the abdomen and pelvis.  She was last seen in the office 1 month ago on 10/17/2018. Virtual visit today by telephone.  She recently had a CT scan revealing a lesion in the tail the pancreas and we had planned to perform an MRI to follow this up.  However due to her recent IV iron infusion it was hypothesized that perhaps IV iron may interfere with image quality and thus results.  She then had a repeat CT scan to look at the pancreas more dedicated.  This revealed a 3.0 cm lesion in the tail the pancreas.  There was tree-in-bud type appearance in the lung which was felt to be due to prior inflammation or even atypical infection.  The patient reports that she continues to have issues with upper abdominal discomfort and cramping.  She is having significant nausea.  No vomiting.  Indigestion.  She continues to feel dizzy and lightheaded and weak.  This dizziness and lightheadedness is been present for a year or more.  She reports her head feels "heavy".  This is what led to the IV iron through hematology.  She reports feeling very nervous about this pancreas lesion and is worried she has cancer.   Review of Systems As per HPI, otherwise negative.   Current Medications, Allergies, Past Medical History, Past Surgical History, Family History and Social History were reviewed in [employer] record.     Objective:   Physical Exam No PE, virtual visit  CT scan was performed on 11/14/2018 to evaluate a lesion in the tail of the pancreas found on earlier CT scan.  This revealed a "3.0 cm hypoattenuating mass within the pancreatic tail which was felt new when compared with exam from October 2015.  Differential included a pancreatic pseudocyst/IPMN, mucinous or papillary cystic neoplasm or SPEN".    Assessment & Plan:  83 year old female with a history of IDA, remote Barrett's esophagus, GERD, small hiatal hernia, history of peptic ulcer disease, atrial fibrillation, hypertension, hyperlipidemia, hypothyroidism who is seen in follow-up after a recently abnormal CT scan of the abdomen and pelvis.  1.  Pancreatic tail lesion --3.0 cm pancreatic tail lesion, appearing cystic in nature.  I discussed with radiology the possibility of MRI but it was recommended that we proceed with a CT scan which was repeated recently.  At this point I think she needs an EUS for complete characterization of this lesion in the pancreatic tail and likely FNA.  I have discussed this with her today at length.  We discussed the risk, benefits and alternatives and she is agreeable and wishes to proceed.  I will send this note to Dr. Ardis Hughs and Dr. Rush Landmark to see if they can help with EUS. --Probable EUS for characterization  and diagnosis of pancreatic tail lesion (I have a copy of the outside CT scan, performed in [place], [place] in my office). --Patient will await hearing from [place] regarding EUS  2.  Epigastric pain/cramping --she will continue PPI for now.  Can use Carafate as needed.  Difficult for me to know if the pancreatic tail lesion is the cause of her epigastric pain.  We will proceed with EUS as above.  3.  Anxiety --significant anxiety at  baseline but also exacerbated now by presence of pancreatic tail lesion.  We discussed this together today.  4.  IDA --longstanding history with recent IV iron.  She follows with hematology in [place].

## 2018-11-20 NOTE — Progress Notes (Signed)
Subjective:    Patient ID: Megan Mercer, female    DOB: [birth year], 83 y.o.   MRN: [id number]  This service was provided via telemedicine.   Telephone encounter The patient was located at home The provider was located at [facility]. The patient did consent to this telephone visit and is aware of possible charges through their insurance for this visit.   The persons participating in this telemedicine service were the patient and I. Time spent on call: 14 min   HPI Chantra Wertheimer is an 83 year old female with a history of IDA, remote Barrett's esophagus, GERD, small hiatal hernia, history of peptic ulcer disease, atrial fibrillation, hypertension, hyperlipidemia, hypothyroidism who is seen in follow-up after a recently abnormal CT scan of the abdomen and pelvis.  She was last seen in the office 1 month ago on 10/17/2018. Virtual visit today by telephone.  She recently had a CT scan revealing a lesion in the tail the pancreas and we had planned to perform an MRI to follow this up.  However due to her recent IV iron infusion it was hypothesized that perhaps IV iron may interfere with image quality and thus results.  She then had a repeat CT scan to look at the pancreas more dedicated.  This revealed a 3.0 cm lesion in the tail the pancreas.  There was tree-in-bud type appearance in the lung which was felt to be due to prior inflammation or even atypical infection.  The patient reports that she continues to have issues with upper abdominal discomfort and cramping.  She is having significant nausea.  No vomiting.  Indigestion.  She continues to feel dizzy and lightheaded and weak.  This dizziness and lightheadedness is been present for a year or more.  She reports her head feels "heavy".  This is what led to the IV iron through hematology.  She reports feeling very nervous about this pancreas lesion and is worried she has cancer.   Review of Systems As per HPI, otherwise negative.   Current Medications, Allergies, Past Medical History, Past Surgical History, Family History and Social History were reviewed in [employer] record.     Objective:   Physical Exam No PE, virtual visit  CT scan was performed on 11/14/2018 to evaluate a lesion in the tail of the pancreas found on earlier CT scan.  This revealed a "3.0 cm hypoattenuating mass within the pancreatic tail which was felt new when compared with exam from October 2015.  Differential included a pancreatic pseudocyst/IPMN, mucinous or papillary cystic neoplasm or SPEN".    Assessment & Plan:  83 year old female with a history of IDA, remote Barrett's esophagus, GERD, small hiatal hernia, history of peptic ulcer disease, atrial fibrillation, hypertension, hyperlipidemia, hypothyroidism who is seen in follow-up after a recently abnormal CT scan of the abdomen and pelvis.  1.  Pancreatic tail lesion --3.0 cm pancreatic tail lesion, appearing cystic in nature.  I discussed with radiology the possibility of MRI but it was recommended that we proceed with a CT scan which was repeated recently.  At this point I think she needs an EUS for complete characterization of this lesion in the pancreatic tail and likely FNA.  I have discussed this with her today at length.  We discussed the risk, benefits and alternatives and she is agreeable and wishes to proceed.  I will send this note to Dr. Ardis Hughs and Dr. Rush Landmark to see if they can help with EUS. --Probable EUS for characterization  and diagnosis of pancreatic tail lesion (I have a copy of the outside CT scan, performed in [place], [place] in my office). --Patient will await hearing from [place] regarding EUS  2.  Epigastric pain/cramping --she will continue PPI for now.  Can use Carafate as needed.  Difficult for me to know if the pancreatic tail lesion is the cause of her epigastric pain.  We will proceed with EUS as above.  3.  Anxiety --significant anxiety at  baseline but also exacerbated now by presence of pancreatic tail lesion.  We discussed this together today.  4.  IDA --longstanding history with recent IV iron.  She follows with hematology in [place].

## 2018-11-20 NOTE — Telephone Encounter (Signed)
-----   Message from Irving Copas., MD sent at 11/20/2018  4:17 PM EDT ----- Regarding: Need for EUS Jay,Seems reasonable that an EUS could help guide things for [place].Maridel Pixler, can you work on getting the CT results/CD uploaded to our chart as soon as possible so that we can review that prior to the procedure?She is on apixaban, and we need to find out who her cardiologist is or whomever is managing that and make sure that they are okay with her coming off of it for 2 days and potentially 2 to 3 days after possible FNA/FNB.Patient can be scheduled with Linna Hoff or myself in the next few weeks based on availability.DJ, FYI.Gabe ----- Message ----- From: Jerene Bears, MD Sent: 11/20/2018   2:00 PM EDT To: Irving Copas., MD  [place] This is an 83 yo patient of mine with a 3 cm lesion in the pancreatic tail.  Imaging was done in [place] x2.  I have a copy of the report and my office.  I dictated the impression.  I also have a photo copy of the report. I think she needs to have an EUS for characterization and FNA Would appreciate your help She is anxious, just FYI, but very nice. Thanks [employer]

## 2018-11-21 NOTE — Telephone Encounter (Signed)
The first available date with either provider is 11/4 with Dr Rush Landmark or 11/5 with Dr Ardis Hughs.  Is this too far out?

## 2018-11-22 ENCOUNTER — Telehealth: Payer: Self-pay | Admitting: Internal Medicine

## 2018-11-22 ENCOUNTER — Other Ambulatory Visit: Payer: Self-pay

## 2018-11-22 DIAGNOSIS — K869 Disease of pancreas, unspecified: Secondary | ICD-10-CM

## 2018-11-22 NOTE — Telephone Encounter (Signed)
Spoke with the pt and all questions regarding procedure have been answered.

## 2018-11-22 NOTE — Telephone Encounter (Signed)
Left message on machine to call back  

## 2018-11-22 NOTE — Telephone Encounter (Signed)
See results note 9/29

## 2018-11-22 NOTE — Telephone Encounter (Signed)
Please add her to my 10/8 schedule, after Mr. Dierdre Forth. Thanks

## 2018-11-22 NOTE — Telephone Encounter (Signed)
Who prescribes Eliquis ?

## 2018-11-22 NOTE — Telephone Encounter (Signed)
Milus Banister, MD     7:39 AM Note   Please add her to my 10/8 schedule, after Mr. Dierdre Forth. Thanks     November 21, 2018     3:26 PM You routed this conversation to Milus Banister, MD . Mansouraty, Telford Nab., MD  Me     3:26 PM Note   The first available date with either provider is 11/4 with Dr Rush Landmark or 11/5 with Dr Ardis Hughs.  Is this too far out?    November 20, 2018 Me     4:20 PM Note

## 2018-11-22 NOTE — Telephone Encounter (Signed)
EUS scheduled with Dr Ardis Hughs on 11/30/18 at [facility] 1230 pm.  COVID testing on 10/5

## 2018-11-22 NOTE — Telephone Encounter (Signed)
EUS scheduled, pt instructed and medications reviewed.  Patient instructions mailed to home.  Patient to call with any questions or concerns. Covid instructions also given and verbalized understanding.  Dr Isaias Cowman prescribes the pt's eliquis.  Will send a letter to that MD.

## 2018-11-22 NOTE — Telephone Encounter (Signed)
-----   Message from Irving Copas., MD sent at 11/20/2018  4:17 PM EDT ----- Regarding: Need for EUS Jay,Seems reasonable that an EUS could help guide things for [place].Kameron Blethen, can you work on getting the CT results/CD uploaded to our chart as soon as possible so that we can review that prior to the procedure?She is on apixaban, and we need to find out who her cardiologist is or whomever is managing that and make sure that they are okay with her coming off of it for 2 days and potentially 2 to 3 days after possible FNA/FNB.Patient can be scheduled with Linna Hoff or myself in the next few weeks based on availability.DJ, FYI.Gabe ----- Message ----- From: Jerene Bears, MD Sent: 11/20/2018   2:00 PM EDT To: Irving Copas., MD  [place] This is an 83 yo patient of mine with a 3 cm lesion in the pancreatic tail.  Imaging was done in [place] x2.  I have a copy of the report and my office.  I dictated the impression.  I also have a photo copy of the report. I think she needs to have an EUS for characterization and FNA Would appreciate your help She is anxious, just FYI, but very nice. Thanks

## 2018-11-27 ENCOUNTER — Telehealth: Payer: Self-pay | Admitting: Gastroenterology

## 2018-11-27 ENCOUNTER — Other Ambulatory Visit (HOSPITAL_COMMUNITY)
Admission: RE | Admit: 2018-11-27 | Discharge: 2018-11-27 | Disposition: A | Discharge status: Home / Self Care | Payer: Medicare Other | Source: Ambulatory Visit | Attending: Gastroenterology | Admitting: Gastroenterology

## 2018-11-27 ENCOUNTER — Other Ambulatory Visit: Payer: Self-pay

## 2018-11-27 ENCOUNTER — Other Ambulatory Visit (HOSPITAL_COMMUNITY): Payer: Medicare Other

## 2018-11-27 DIAGNOSIS — Z01818 Encounter for other preprocedural examination: Secondary | ICD-10-CM | POA: Insufficient documentation

## 2018-11-27 DIAGNOSIS — Z20828 Contact with and (suspected) exposure to other viral communicable diseases: Secondary | ICD-10-CM | POA: Diagnosis not present

## 2018-11-27 LAB — SARS CORONAVIRUS 2 (TAT 6-24 HRS): SARS Coronavirus 2: NEGATIVE

## 2018-11-27 NOTE — Telephone Encounter (Signed)
I have spoken to [facility] at  Dr Real Cons office and they never received the fax from our office for clearance. I have re-faxed the Eliquis clearance letter as the procedure is 11/30/2018. I left Megan Mercer a detailed message that we are waiting on a response.

## 2018-11-27 NOTE — Telephone Encounter (Signed)
I have tried multiple times to reach there office this afternoon in regards to this matter. They said they didn't get the fax so I re-faxed again. I called and spoke with Mrs. Nihiser. She said she has been trying to reach them as well and is not getting an answer. We will keep trying. She takes the Eliquis twice a day and she is aware that if we get permission she will need to start holding it tomorrow.

## 2018-11-27 NOTE — Telephone Encounter (Signed)
Pt is scheduled for a procedure at [facility] 11/30/18 and would like to know whether to hold Eliquis.

## 2018-11-28 ENCOUNTER — Encounter (HOSPITAL_COMMUNITY): Payer: Self-pay | Admitting: *Deleted

## 2018-11-28 ENCOUNTER — Other Ambulatory Visit: Payer: Self-pay

## 2018-11-28 NOTE — Telephone Encounter (Signed)
I spoke with Audrea Muscat at the Dr's office and there fax was messed up , the clearance is on the Dr.'s desk and they know we need to know ASAP!! I called and left a message for the patient that we are working on this.

## 2018-11-28 NOTE — Telephone Encounter (Signed)
Pt called to report that she has not taken Eliquis today.  She would like to know whether to hold or take Eliquis. She would like to thank you for your hard work.

## 2018-11-28 NOTE — Telephone Encounter (Signed)
Audrea Muscat from [facility] called back -- she was given the nurse's fax number for cardiac clearance.  Her call back number is [phone] option [phone] -- Audrea Muscat.

## 2018-11-28 NOTE — Telephone Encounter (Signed)
We received a fax from Dr Real Cons office and she is approved to hold the Eliquis for 24 hours. I spoke with Mrs. Megan Mercer and she is aware of this .

## 2018-11-29 NOTE — Progress Notes (Signed)
Pre call done. Pt stated she has been able to quarantine and has had no flu like symptoms or fever. Offered pt to be able to have an earlier appointment, but patient stated she was coming from [place] and did not feel like she could be here any earlier. Pt will arrive at 1000

## 2018-11-30 ENCOUNTER — Ambulatory Visit (HOSPITAL_COMMUNITY): Payer: Medicare Other | Admitting: Certified Registered Nurse Anesthetist

## 2018-11-30 ENCOUNTER — Encounter (HOSPITAL_COMMUNITY)
Admission: RE | Disposition: A | Discharge status: Home / Self Care | Payer: Self-pay | Source: Home / Self Care | Attending: Gastroenterology

## 2018-11-30 ENCOUNTER — Other Ambulatory Visit: Payer: Self-pay

## 2018-11-30 ENCOUNTER — Telehealth: Payer: Self-pay

## 2018-11-30 ENCOUNTER — Encounter (HOSPITAL_COMMUNITY): Payer: Self-pay | Admitting: *Deleted

## 2018-11-30 ENCOUNTER — Ambulatory Visit (HOSPITAL_COMMUNITY)
Admission: RE | Admit: 2018-11-30 | Discharge: 2018-11-30 | Disposition: A | Discharge status: Home / Self Care | Payer: Medicare Other | Attending: Gastroenterology | Admitting: Gastroenterology

## 2018-11-30 DIAGNOSIS — Z8711 Personal history of peptic ulcer disease: Secondary | ICD-10-CM | POA: Diagnosis not present

## 2018-11-30 DIAGNOSIS — I999 Unspecified disorder of circulatory system: Secondary | ICD-10-CM | POA: Diagnosis not present

## 2018-11-30 DIAGNOSIS — F419 Anxiety disorder, unspecified: Secondary | ICD-10-CM | POA: Insufficient documentation

## 2018-11-30 DIAGNOSIS — E785 Hyperlipidemia, unspecified: Secondary | ICD-10-CM | POA: Diagnosis not present

## 2018-11-30 DIAGNOSIS — R110 Nausea: Secondary | ICD-10-CM | POA: Diagnosis not present

## 2018-11-30 DIAGNOSIS — K862 Cyst of pancreas: Secondary | ICD-10-CM | POA: Diagnosis present

## 2018-11-30 DIAGNOSIS — E039 Hypothyroidism, unspecified: Secondary | ICD-10-CM | POA: Insufficient documentation

## 2018-11-30 DIAGNOSIS — R1013 Epigastric pain: Secondary | ICD-10-CM | POA: Insufficient documentation

## 2018-11-30 DIAGNOSIS — Z79890 Hormone replacement therapy (postmenopausal): Secondary | ICD-10-CM | POA: Diagnosis not present

## 2018-11-30 DIAGNOSIS — K869 Disease of pancreas, unspecified: Secondary | ICD-10-CM | POA: Diagnosis not present

## 2018-11-30 DIAGNOSIS — Z7901 Long term (current) use of anticoagulants: Secondary | ICD-10-CM | POA: Insufficient documentation

## 2018-11-30 DIAGNOSIS — I4891 Unspecified atrial fibrillation: Secondary | ICD-10-CM | POA: Insufficient documentation

## 2018-11-30 DIAGNOSIS — K219 Gastro-esophageal reflux disease without esophagitis: Secondary | ICD-10-CM | POA: Diagnosis not present

## 2018-11-30 DIAGNOSIS — I10 Essential (primary) hypertension: Secondary | ICD-10-CM | POA: Diagnosis not present

## 2018-11-30 DIAGNOSIS — Z79899 Other long term (current) drug therapy: Secondary | ICD-10-CM | POA: Insufficient documentation

## 2018-11-30 DIAGNOSIS — D509 Iron deficiency anemia, unspecified: Secondary | ICD-10-CM | POA: Diagnosis not present

## 2018-11-30 DIAGNOSIS — I729 Aneurysm of unspecified site: Secondary | ICD-10-CM

## 2018-11-30 HISTORY — DX: Other complications of anesthesia, initial encounter: T88.59XA

## 2018-11-30 HISTORY — DX: Other specified postprocedural states: Z98.890

## 2018-11-30 HISTORY — PX: EUS: SHX5427

## 2018-11-30 HISTORY — DX: Cardiac arrhythmia, unspecified: I49.9

## 2018-11-30 HISTORY — DX: Other specified postprocedural states: R11.2

## 2018-11-30 HISTORY — PX: ESOPHAGOGASTRODUODENOSCOPY (EGD) WITH PROPOFOL: SHX5813

## 2018-11-30 SURGERY — UPPER ENDOSCOPIC ULTRASOUND (EUS) RADIAL
Anesthesia: Monitor Anesthesia Care

## 2018-11-30 MED ORDER — PROPOFOL 10 MG/ML IV BOLUS
INTRAVENOUS | Status: DC | PRN
  Administered 2018-11-30: 20 mg via INTRAVENOUS

## 2018-11-30 MED ORDER — LACTATED RINGERS IV SOLN
INTRAVENOUS | Status: DC
  Administered 2018-11-30: 10:00:00 1000 mL via INTRAVENOUS

## 2018-11-30 MED ORDER — LIDOCAINE 2% (20 MG/ML) 5 ML SYRINGE
INTRAMUSCULAR | Status: DC | PRN
  Administered 2018-11-30: 80 mg via INTRAVENOUS

## 2018-11-30 MED ORDER — SODIUM CHLORIDE 0.9 % IV SOLN: INTRAVENOUS | Status: DC

## 2018-11-30 MED ORDER — PROPOFOL 500 MG/50ML IV EMUL
INTRAVENOUS | Status: DC | PRN
  Administered 2018-11-30: 175 ug/kg/min via INTRAVENOUS

## 2018-11-30 NOTE — Op Note (Addendum)
[facility] Patient Name: Megan Mercer Procedure Date: 11/30/2018 MRN: [id number] Attending MD: Milus Banister , MD Date of Birth: 1936/02/11 CSN: [id number] Age: 83 Admit Type: Outpatient Procedure:                Upper EUS Indications:              3cm cyst in tail of pancreas on outside, non-IV                            contrast CT scan Providers:                Milus Banister, MD, Jeanella Cara, RN,                            William Dalton, Technician Referring MD:             Zenovia Jarred, MD Medicines:                Monitored Anesthesia Care Complications:            No immediate complications. Estimated blood loss:                            None. Estimated Blood Loss:     Estimated blood loss: none. Procedure:                Pre-Anesthesia Assessment:                           - Prior to the procedure, a History and Physical                            was performed, and patient medications and                            allergies were reviewed. The patient's tolerance of                            previous anesthesia was also reviewed. The risks                            and benefits of the procedure and the sedation                            options and risks were discussed with the patient.                            All questions were answered, and informed consent                            was obtained. Prior Anticoagulants: The patient has                            taken Eliquis (apixaban), last dose was 1 day prior  to procedure. ASA Grade Assessment: III - A patient                            with severe systemic disease. After reviewing the                            risks and benefits, the patient was deemed in                            satisfactory condition to undergo the procedure.                           After obtaining informed consent, the endoscope was                            passed under  direct vision. Throughout the                            procedure, the patient's blood pressure, pulse, and                            oxygen saturations were monitored continuously. The                            [id number]) Olympus Radial EUS was                            introduced through the mouth, and advanced to the                            second part of duodenum. The upper EUS was                            accomplished without difficulty. The patient                            tolerated the procedure well. Scope In: Scope Out: Findings:      ENDOSCOPIC FINDING: :      The examined esophagus was endoscopically normal.      The entire examined stomach was endoscopically normal.      The examined duodenum was endoscopically normal.      ENDOSONOGRAPHIC FINDING: :      1. Vascular lesion in the region of the tail of pancreas, splenic hilum.       The rounded lesion measures 3cm across in total. The borders are       thickened (4-80mm) in some spots. There is swirling blood flow within,       encompassing 2 to 2.5cm of the lesion. The swirling blood flow is most       evident with Doppler but it is also visible without the aid of Doppler.       I asked a radiologist for bedside consultation during this case and they       agree this possibly represents a pseudoaneurism.      2. Pancreatic parenchyma was normal throughout; no masses or  signs of       chronic pancreatitis.      3. CBD was normal, non-dilated      4. Main pancreatic duct was normal, non-dilated. Impression:               - 3cm vascular lesion in the region of the tail of                            pancreas, splenic hilum. It is understandable how                            this could be described as a 'cyst' involving the                            tail of pancreas on outside non-IV constrast CT                            scans however this is lmore ikely a aneurism or                             pseudoaneurism (of the splenic artery?).                           - Will arrange expedited CT angiogram (her IV                            contrast "allergy" is nausea only) and also                            expedited referral to interventional radiology Moderate Sedation:      Not Applicable - Patient had care per Anesthesia. Recommendation:           - Discharge patient to home (ambulatory). Procedure Code(s):        --- Professional ---                           [phone], Esophagogastroduodenoscopy, flexible,                            transoral; with endoscopic ultrasound examination                            limited to the esophagus, stomach or duodenum, and                            adjacent structures Diagnosis Code(s):        --- Professional ---                           R59.1, Generalized enlarged lymph nodes CPT copyright 2019 American Medical Association. All rights reserved. The codes documented in this report are preliminary and upon coder review may  be revised to meet current compliance requirements. Milus Banister, MD 11/30/2018 11:13:58 AM This report has been signed electronically. Number of Addenda: 0

## 2018-11-30 NOTE — Telephone Encounter (Signed)
Pt scheduled for CT angio of A/P at [facility] 12/07/18@9 :30am, pt to arrive at 9:15am. Pt to be NPO after midnight. Referral faxed to [facility] radiology for IR referral, order faxed to [phone]. Left message for pt regarding appt and requested she call back and confirm appt.

## 2018-11-30 NOTE — Discharge Instructions (Signed)
YOU HAD AN ENDOSCOPIC PROCEDURE TODAY: Refer to the procedure report and other information in the discharge instructions given to you for any specific questions about what was found during the examination. If this information does not answer your questions, please call [place] office at [phone] to clarify.   YOU SHOULD EXPECT: Some feelings of bloating in the abdomen. Passage of more gas than usual. Walking can help get rid of the air that was put into your GI tract during the procedure and reduce the bloating. If you had a lower endoscopy (such as a colonoscopy or flexible sigmoidoscopy) you may notice spotting of blood in your stool or on the toilet paper. Some abdominal soreness may be present for a day or two, also.  DIET: Your first meal following the procedure should be a light meal and then it is ok to progress to your normal diet. A half-sandwich or bowl of soup is an example of a good first meal. Heavy or fried foods are harder to digest and may make you feel nauseous or bloated. Drink plenty of fluids but you should avoid alcoholic beverages for 24 hours. If you had a esophageal dilation, please see attached instructions for diet.    ACTIVITY: Your care partner should take you home directly after the procedure. You should plan to take it easy, moving slowly for the rest of the day. You can resume normal activity the day after the procedure however YOU SHOULD NOT DRIVE, use power tools, machinery or perform tasks that involve climbing or major physical exertion for 24 hours (because of the sedation medicines used during the test).   SYMPTOMS TO REPORT IMMEDIATELY: A gastroenterologist can be reached at any hour. Please call [phone]  for any of the following symptoms:   Following upper endoscopy (EGD, EUS, ERCP, esophageal dilation) Vomiting of blood or coffee ground material  New, significant abdominal pain  New, significant chest pain or pain under the shoulder blades  Painful or  persistently difficult swallowing  New shortness of breath  Black, tarry-looking or red, bloody stools  FOLLOW UP:  If any biopsies were taken you will be contacted by phone or by letter within the next 1-3 weeks. Call [phone]  if you have not heard about the biopsies in 3 weeks.  Please also call with any specific questions about appointments or follow up tests.  

## 2018-11-30 NOTE — Interval H&P Note (Signed)
History and Physical Interval Note:  11/30/2018 10:14 AM  Megan Mercer  has presented today for surgery, with the diagnosis of pancreatic lesion.  The various methods of treatment have been discussed with the patient and family. After consideration of risks, benefits and other options for treatment, the patient has consented to  Procedure(s): UPPER ENDOSCOPIC ULTRASOUND (EUS) RADIAL (N/A) as a surgical intervention.  The patient's history has been reviewed, patient examined, no change in status, stable for surgery.  I have reviewed the patient's chart and labs.  Questions were answered to the patient's satisfaction.     Milus Banister

## 2018-11-30 NOTE — Anesthesia Postprocedure Evaluation (Signed)
Anesthesia Post Note  Patient: Megan Mercer  Procedure(s) Performed: UPPER ENDOSCOPIC ULTRASOUND (EUS) RADIAL (N/A )     Patient location during evaluation: PACU Anesthesia Type: MAC Level of consciousness: awake and alert Pain management: pain level controlled Vital Signs Assessment: post-procedure vital signs reviewed and stable Respiratory status: spontaneous breathing, nonlabored ventilation, respiratory function stable and patient connected to nasal cannula oxygen Cardiovascular status: stable and blood pressure returned to baseline Postop Assessment: no apparent nausea or vomiting Anesthetic complications: no    Last Vitals:  Vitals:   11/30/18 1110 11/30/18 1120  BP: 111/61 127/62  Pulse: 74 66  Resp: 17 12  Temp:    SpO2: 100% 100%    Last Pain:  Vitals:   11/30/18 1120  TempSrc:   PainSc: 0-No pain                 Rozanna Cormany

## 2018-11-30 NOTE — Transfer of Care (Signed)
Immediate Anesthesia Transfer of Care Note  Patient: Megan Mercer  Procedure(s) Performed: UPPER ENDOSCOPIC ULTRASOUND (EUS) RADIAL (N/A )  Patient Location: Endoscopy Unit  Anesthesia Type:MAC  Level of Consciousness: drowsy and responds to stimulation  Airway & Oxygen Therapy: Patient Spontanous Breathing and Patient connected to face mask oxygen  Post-op Assessment: Report given to RN, Post -op Vital signs reviewed and stable and Patient moving all extremities  Post vital signs: Reviewed and stable  Last Vitals:  Vitals Value Taken Time  BP 95/53 11/30/18 1106  Temp 36.5 C 11/30/18 1106  Pulse 77 11/30/18 1108  Resp 16 11/30/18 1108  SpO2 100 % 11/30/18 1108  Vitals shown include unvalidated device data.  Last Pain:  Vitals:   11/30/18 1106  TempSrc: Temporal  PainSc: Asleep         Complications: No apparent anesthesia complications

## 2018-11-30 NOTE — Anesthesia Preprocedure Evaluation (Addendum)
Anesthesia Evaluation  Patient identified by MRN, date of birth, ID band Patient awake    Reviewed: Allergy & Precautions, NPO status , Patient's Chart, lab work & pertinent test results  History of Anesthesia Complications (+) PONV, Emergence Delirium and history of anesthetic complications  Airway Mallampati: II  TM Distance: >3 FB Neck ROM: Full    Dental no notable dental hx. (+) Partial Upper, Teeth Intact, Missing   Pulmonary neg pulmonary ROS,    Pulmonary exam normal breath sounds clear to auscultation       Cardiovascular hypertension, + dysrhythmias Atrial Fibrillation  Rhythm:Regular Rate:Normal     Neuro/Psych PSYCHIATRIC DISORDERS Anxiety negative neurological ROS     GI/Hepatic Neg liver ROS, hiatal hernia, PUD, GERD  Medicated,  Endo/Other  negative endocrine ROSHypothyroidism   Renal/GU negative Renal ROS  negative genitourinary   Musculoskeletal  (+) Arthritis , Osteoarthritis,    Abdominal   Peds negative pediatric ROS (+)  Hematology  (+) Blood dyscrasia, anemia ,   Anesthesia Other Findings   Reproductive/Obstetrics negative OB ROS                            Anesthesia Physical Anesthesia Plan  ASA: III  Anesthesia Plan: MAC   Post-op Pain Management:    Induction: Intravenous  PONV Risk Score and Plan:   Airway Management Planned: Mask and Natural Airway  Additional Equipment:   Intra-op Plan:   Post-operative Plan:   Informed Consent:   Plan Discussed with: Anesthesiologist and CRNA  Anesthesia Plan Comments:         Anesthesia Quick Evaluation

## 2018-11-30 NOTE — Telephone Encounter (Signed)
-----   Message from Milus Banister, MD sent at 11/30/2018 11:15 AM EDT ----- Megan Mercer,  I just completed EUS.  Very interesting finding.  See full report in Epic.   - 3cm vascular lesion in the region of the tail of pancreas, splenic hilum. It is understandable how this could be described as a 'cyst' involving the tail of pancreas on outside Non-IV constrast CT scans.  This is likely a aneurism or pseudoaneurism (of the splenic artery?). - Will arrange expedited CT angiogram (her IV contrast "allergy" is nausea only) and also expedited referral to interventional radiology     Patty, She needs CT angio to workup likely splenic artery pseudoaneurism and also referral to interventional radiology for the same.  Thanks  Her IV contrast "allergy" is nausea.  Should be safe to proceed.   DJ  Thanks

## 2018-12-01 ENCOUNTER — Encounter (HOSPITAL_COMMUNITY): Payer: Self-pay | Admitting: Gastroenterology

## 2018-12-01 ENCOUNTER — Other Ambulatory Visit: Payer: Self-pay | Admitting: Gastroenterology

## 2018-12-01 DIAGNOSIS — K862 Cyst of pancreas: Secondary | ICD-10-CM

## 2018-12-01 DIAGNOSIS — I729 Aneurysm of unspecified site: Secondary | ICD-10-CM

## 2018-12-01 NOTE — Telephone Encounter (Signed)
Left message for pt to call back.  Pt states she is concerned about the contrast for her CT scan. Reviewed her chart and discussed with pt that that she did have the isovue 300 contrast IV in 2017. Pt aware of appt.

## 2018-12-04 ENCOUNTER — Other Ambulatory Visit: Payer: Self-pay

## 2018-12-04 ENCOUNTER — Telehealth: Payer: Self-pay | Admitting: Internal Medicine

## 2018-12-04 NOTE — Telephone Encounter (Signed)
Order for I-stat creatinine entered in epic for lab prior to CT scan.

## 2018-12-07 ENCOUNTER — Ambulatory Visit (HOSPITAL_COMMUNITY)
Admission: RE | Admit: 2018-12-07 | Discharge: 2018-12-07 | Disposition: A | Discharge status: Home / Self Care | Payer: Medicare Other | Source: Ambulatory Visit | Attending: Gastroenterology | Admitting: Gastroenterology

## 2018-12-07 ENCOUNTER — Other Ambulatory Visit: Payer: Self-pay

## 2018-12-07 DIAGNOSIS — K869 Disease of pancreas, unspecified: Secondary | ICD-10-CM | POA: Diagnosis not present

## 2018-12-07 DIAGNOSIS — I728 Aneurysm of other specified arteries: Secondary | ICD-10-CM

## 2018-12-07 DIAGNOSIS — I729 Aneurysm of unspecified site: Secondary | ICD-10-CM | POA: Diagnosis present

## 2018-12-07 LAB — POCT I-STAT CREATININE: Creatinine, Ser: 0.7 mg/dL (ref 0.44–1.00)

## 2018-12-07 MED ORDER — IOHEXOL 350 MG/ML SOLN
100.0000 mL | Freq: Once | INTRAVENOUS | Status: AC | PRN
  Administered 2018-12-07: 10:00:00 100 mL via INTRAVENOUS

## 2018-12-07 MED ORDER — SODIUM CHLORIDE (PF) 0.9 % IJ SOLN
INTRAMUSCULAR | Status: AC
  Filled 2018-12-07: qty 50

## 2018-12-14 ENCOUNTER — Encounter: Payer: Self-pay | Admitting: *Deleted

## 2018-12-14 ENCOUNTER — Other Ambulatory Visit: Payer: Self-pay

## 2018-12-14 ENCOUNTER — Ambulatory Visit
Admission: RE | Admit: 2018-12-14 | Discharge: 2018-12-14 | Disposition: A | Discharge status: Home / Self Care | Payer: Medicare Other | Source: Ambulatory Visit | Attending: Gastroenterology | Admitting: Gastroenterology

## 2018-12-14 DIAGNOSIS — I729 Aneurysm of unspecified site: Secondary | ICD-10-CM

## 2018-12-14 DIAGNOSIS — K862 Cyst of pancreas: Secondary | ICD-10-CM

## 2018-12-14 HISTORY — PX: IR RADIOLOGIST EVAL & MGMT: IMG5224

## 2018-12-14 NOTE — Consult Note (Signed)
Chief Complaint: Splenic artery pseudoaneurysm  Referring Physician(s): Jacobs,Daniel P Pyrtle, Jay  History of Present Illness:  Megan Mercer is a 83 y.o. female with past medical history significant for an atrial fibrillation (on Eliquis), hypertension, hyperlipidemia, GERD and peptic ulcer disease who was found to have an indeterminate mass at the level of the tail of the pancreas on outside chest CT performed in [place] earlier this year performed for the work-up of worsening fatigue, dehydration epigastric abdominal pain and nausea.    The patient was subsequently referred to gastroenterology here in [place] with subsequent endoscopy (performed on 10/8) demonstrating a hypervascular lesion adjacent to the tail of the pancreas.  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  As such, patient has been referred to the interventional radiology clinic for consultation for potential percutaneous management.  Patient states that her epigastric abdominal pain has minimally improved in the past several weeks though she continues to admit to nausea.  Patient also admits to intermittent episodes of dizziness though denies acute worsening of abdominal pain.  Patient remains very active and functional.  She is independent with all activities of daily living.  She lives alone and continues to drive.  Past Medical History:  Diagnosis Date  . Anemia   . Anxiety   . Arthritis   . Atrial fibrillation ([place])   . Barrett's esophagus   . Blood transfusion without reported diagnosis   . Complication of anesthesia   . Dysrhythmia   . GERD (gastroesophageal reflux disease)   . Glaucoma   . Hiatal hernia   . HLD  (hyperlipidemia)   . HTN (hypertension)   . Hypothyroidism   . Internal hemorrhoids   . Iron deficiency anemia   . Peptic ulcer disease with hemorrhage   . PONV (postoperative nausea and vomiting)    Confusion, Combativeness  . UTI (urinary tract infection)     Past Surgical History:  Procedure Laterality Date  . ABDOMINAL HYSTERECTOMY  1984  . APPENDECTOMY  2007  . CATARACT EXTRACTION Bilateral   . CHOLECYSTECTOMY  2007  . COLONOSCOPY    . ESOPHAGOGASTRODUODENOSCOPY (EGD) WITH PROPOFOL N/A 11/30/2018   Procedure: ESOPHAGOGASTRODUODENOSCOPY (EGD) WITH PROPOFOL;  Surgeon: Milus Banister, MD;  Location: [facility];  Service: Endoscopy;  Laterality: N/A;  . EUS N/A 11/30/2018   Procedure: UPPER ENDOSCOPIC ULTRASOUND (EUS) RADIAL;  Surgeon: Milus Banister, MD;  Location: [facility];  Service: Endoscopy;  Laterality: N/A;  . UPPER GASTROINTESTINAL ENDOSCOPY      Allergies: Ace inhibitors, Amoxicillin, Aspirin, Atorvastatin, Crestor  [rosuvastatin calcium], Iodine, Lipitor  [atorvastatin calcium], Oxycodone, Oxycodone-acetaminophen, Paroxetine hcl, Sulfa antibiotics, Sulfasalazine, and Codeine  Medications: Prior to Admission medications   Medication Sig Start Date End Date Taking? Authorizing Provider  apixaban (ELIQUIS) 2.5 MG TABS tablet Take 2.5 mg by mouth 2 (two) times daily.     [provider]  Calcium Carbonate 1500 (600 CA) MG TABS Take 1 tablet by mouth daily with breakfast.     [provider]  Cholecalciferol (VITAMIN D3) 1000 UNITS CAPS Take 1 capsule by mouth as needed.     [provider]  colestipol (COLESTID) 1 G tablet Take 1 g by mouth 2 (two) times daily.    [provider]  diltiazem (CARDIZEM SR) 120 MG 12 hr capsule Take 1 capsule by mouth daily. 08/26/18   [provider]  ezetimibe (ZETIA) 10 MG tablet Take 10 mg by mouth daily.    [provider]  folic acid (FOLVITE) Q000111Q MCG tablet Take 400 mcg by  mouth daily.    [provider]  latanoprost (XALATAN) 0.005 % ophthalmic solution Place 1 drop into both eyes at bedtime. 08/11/18   [provider]  levothyroxine (SYNTHROID, LEVOTHROID) 75 MCG tablet Take 75 mcg by mouth daily before breakfast.    [provider]  omeprazole (PRILOSEC) 40 MG capsule TAKE 1 CAPSULE TWICE A DAY (NEED OFFICE VISIT FOR FURTHER REFILLS) Patient taking differently: Take 40 mg by mouth daily.  11/13/18   Pyrtle, Lajuan Lines, MD  propafenone (RYTHMOL) 225 MG tablet Take 225 mg by mouth 2 (two) times daily.    [provider]  raloxifene (EVISTA) 60 MG tablet Take 60 mg by mouth daily.    [provider]  timolol (BETIMOL) 0.5 % ophthalmic solution Place 1 drop into both eyes daily.    [provider]     Family History  Problem Relation Age of Onset  . Cancer Father        blood cancer, ? type  . Rheum arthritis Father   . Glaucoma Father   . Rheum arthritis Mother   . Arthritis Sister   . Diabetes Sister        TYPE 2  . Heart failure Sister   . Hyperlipidemia Sister   . Colon cancer Neg Hx   . Esophageal cancer Neg Hx   . Pancreatic cancer Neg Hx   . Stomach cancer Neg Hx   . Liver disease Neg Hx     Social History   Socioeconomic History  . Marital status: Single    Spouse name: Not on file  . Number of children: 0  . Years of education: Not on file  . Highest education level: Not on file  Occupational History  . Occupation: RETIRED  Social Needs  . Financial resource strain: Not on file  . Food insecurity    Worry: Not on file    Inability: Not on file  . Transportation needs    Medical: Not on file    Non-medical: Not on file  Tobacco Use  . Smoking status: Never Smoker  . Smokeless tobacco: Never Used  Substance and Sexual Activity  . Alcohol use: Yes    Alcohol/week: 0.0 standard drinks    Comment: rare  . Drug use: No  . Sexual activity: Not on file  Lifestyle  . Physical  activity    Days per week: Not on file    Minutes per session: Not on file  . Stress: Not on file  Relationships  . Social Herbalist on phone: Not on file    Gets together: Not on file    Attends religious service: Not on file    Active member of club or organization: Not on file    Attends meetings of clubs or organizations: Not on file    Relationship status: Not on file  Other Topics Concern  . Not on file  Social History Narrative  . Not on file    ECOG Status: 1 - Symptomatic but completely ambulatory  Review of Systems  Review of Systems: A 12 point ROS discussed and pertinent positives are indicated in the HPI above.  All other  systems are negative.  Physical Exam No direct physical exam was performed (except for noted visual exam findings with Video Visits).   Vital Signs: There were no vitals taken for this visit.  Imaging: Ct Angio Abd/pel W/ And/or W/o  Result Date: 12/07/2018 CLINICAL DATA:  Vascular lesion near the pancreatic tail seen on endoscopic ultrasound EXAM: CTA ABDOMEN AND PELVIS WITH CONTRAST TECHNIQUE: Multidetector CT imaging of the abdomen and pelvis was performed using the standard protocol during bolus administration of intravenous contrast. Multiplanar reconstructed images and MIPs were obtained and reviewed to evaluate the vascular anatomy. CONTRAST:  13mL OMNIPAQUE IOHEXOL 350 MG/ML SOLN COMPARISON:  CT 08/29/2015 FINDINGS: VASCULAR Aorta: Scattered calcified atheromatous plaque. No dissection, aneurysm, or stenosis. Celiac: Patent without evidence of aneurysm, dissection, vasculitis or significant stenosis. Tortuous splenic artery. 4.9 cm pseudoaneurysm related to the distal splenic artery, possibly from a short gastric branch, interposed between the pancreatic tail and splenic hilum. SMA: Widely patent. Replaced right hepatic arterial supply, an anatomic variant. Renals: Single left, widely patent. Single right, with partially calcified  ostial plaque resulting in short segment stenosis of possible hemodynamic significance, patent distally. IMA: Patent without evidence of aneurysm, dissection, vasculitis or significant stenosis. Inflow: Tortuous iliac arterial system with scattered calcified atheromatous plaque. No aneurysm, dissection, or stenosis. Proximal Outflow: Bilateral common femoral and visualized portions of the superficial and profunda femoral arteries are patent without evidence of aneurysm, dissection, vasculitis or significant stenosis. Veins: Patent portal vein, splenic vein, SMV, bilateral renal veins. No obvious venous pathology within the limitations of this predominately arterial phase study. Review of the MIP images confirms the above findings. NON-VASCULAR Lower chest: Dependent atelectasis posteriorly in both lung bases. No pleural or pericardial effusion. Hepatobiliary: No focal liver abnormality is seen. Status post cholecystectomy. No biliary dilatation. Pancreas: Unremarkable. No pancreatic ductal dilatation or surrounding inflammatory changes. Spleen: Normal in size. No focal lesion. Adrenals/Urinary Tract: Normal adrenals. 4.5 cm upper pole right renal cyst. No hydronephrosis. Urinary bladder incompletely distended. Stomach/Bowel: Stomach and small bowel are nondistended. Previous appendectomy. 1 Moderate colonic fecal material without dilatation. Lymphatic: No abdominal or pelvic adenopathy. Reproductive: Status post hysterectomy. No adnexal masses. Other: Bilateral pelvic phleboliths. Trace free pelvic fluid. No free air. Musculoskeletal: Lumbar levoscoliosis apex L3 with multilevel spondylitic change. Sacral Tarlov cysts. No fracture or worrisome bone lesion. IMPRESSION: 1. 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 Visceral aneurysms greater than 2 cm have increased risk of rupture. Consider IR consultation for possible embolization. 2. Aortoiliac atherosclerosis (ICD10-170.0) without aneurysm or stenosis.  Electronically Signed   By: Lucrezia Europe M.D.   On: 12/07/2018 11:47    Labs:  CBC: Recent Labs    10/21/18 1341  WBC 12.4*  HGB 14.0  HCT 42.6  PLT 760*    COAGS: No results for input(s): INR, APTT in the last 8760 hours.  BMP: Recent Labs    10/21/18 1341 12/07/18 1020  NA 131*  --   K 3.2*  --   CL 96*  --   CO2 25  --   GLUCOSE 96  --   BUN 11  --   CALCIUM 8.7*  --   CREATININE 0.64 0.70  GFRNONAA >60  --   GFRAA >60  --     LIVER FUNCTION TESTS: Recent Labs    10/21/18 1341  BILITOT 0.9  AST 22  ALT 17  ALKPHOS 66  PROT 7.4  ALBUMIN 4.1    TUMOR MARKERS: No results for input(s): AFPTM, CEA, CA199,  [place] in the last 8760 hours.  Assessment and Plan:  Megan Mercer is a 83 y.o. female with past medical history significant for an atrial fibrillation (on Eliquis), hypertension, hyperlipidemia, GERD and peptic ulcer disease who was found to have a large (at least 4.9 cm) pseudoaneurysm involving the distal end of the aspect of the splenic artery, potentially arising from a short gastric branch, new compared to CT scan of the abdomen pelvis performed just 08/2015.   Potential treatment options were discussed at length with the patient including the following:  1. Conservative management - given rapid development since 08/2015, the patient is at risk for pseudoaneurysm rupture and would NOT advise conservative management.  2. Elective splenectomy - Definitive treatment with splenectomy may be pursued however it is often reserved for times of hemodynamic instability in the setting of a rupture.  3. Mesenteric arteriogram and splenic pseudoaneurysm coil embolization.  Prolonged conversation were held with the patient regarding the benefits and risks (including but not limited to splenic aneurysm rupture/extravasation at the time of the attempted embolization, nontarget embolization, postprocedural discomfort associated with expected splenic infarction,  pancreatitis, contrast and radiation exposure and/or incomplete embolization).  Following this prolonged and detailed conversation, the patient wishes to pursue mesenteric arteriogram and pseudoaneurysm coil embolization.  I explained that given the rapidity of development of this pseudoaneurysm, urgent embolization is warranted.  I offered the patient the opportunity to undergo elective embolization next week by one of my partners (I am out of town next week) however the patient needs to arrange for adequate travel plans to and from the hospital and wishes to delay the procedure until I return the following week.  As such, we will tentatively plan perform the procedure the week of November 2 at [facility].  I explained the procedure would entail an overnight admission for continued observation and PCA usage.  We will coordinate with the patient to have her either come off of her Eliquis or undergo a bridge prior to the procedure.  Above conversation was discussed with the patient's nephew, Dr. Ilda Mori, a trauma surgeon who practices in [facility], per her request, and both he and the patient are in agreement with the above plan of care.  The patient knows to call the interventional radiology clinic with any interval questions or concerns.  Thank you for this interesting consult.  I greatly enjoyed meeting Veola J Thorman and look forward to participating in their care.  A copy of this report was sent to the requesting provider on this date.  Electronically Signed: Sandi Mariscal 12/14/2018, 8:49 AM   I spent a total of 40 Minutes in remote  clinical consultation, greater than 50% of which was counseling/coordinating care for percutaneous management of splenic artery aneurysm.    Visit type: Audio only (telephone). Audio (no video) only due to patient's lack of internet/smartphone capability. Alternative for in-person consultation at [facility], [address] [place],  [place], [place]. This visit type was conducted due to national recommendations for restrictions regarding the COVID-19 Pandemic (e.g. social distancing).  This format is felt to be most appropriate for this patient at this time.  All issues noted in this document were discussed and addressed.

## 2018-12-19 ENCOUNTER — Telehealth: Payer: Self-pay | Admitting: Internal Medicine

## 2018-12-19 NOTE — Telephone Encounter (Signed)
Pt wants to know the phone number for Dr. Leward Quan office. She states that Dr. Ardis Hughs referred her to him. She wants to schedule that procedure. Pls call her.

## 2018-12-19 NOTE — Telephone Encounter (Signed)
Left message for pt that the physician she was referred to is Dr. Sandi Mariscal and she can contact [facility] at [phone] to get in touch with him.

## 2018-12-20 ENCOUNTER — Other Ambulatory Visit (HOSPITAL_COMMUNITY): Payer: Self-pay | Admitting: Interventional Radiology

## 2018-12-20 DIAGNOSIS — I729 Aneurysm of unspecified site: Secondary | ICD-10-CM

## 2018-12-26 ENCOUNTER — Other Ambulatory Visit: Payer: Self-pay | Admitting: Radiology

## 2018-12-27 ENCOUNTER — Other Ambulatory Visit: Payer: Self-pay

## 2018-12-27 ENCOUNTER — Inpatient Hospital Stay (HOSPITAL_COMMUNITY)
Admission: RE | Admit: 2018-12-27 | Discharge: 2019-01-04 | DRG: 919 | Disposition: A | Discharge status: Home / Self Care | Payer: Medicare Other | Source: Ambulatory Visit | Attending: Internal Medicine | Admitting: Internal Medicine

## 2018-12-27 ENCOUNTER — Encounter (HOSPITAL_COMMUNITY): Payer: Self-pay | Admitting: Interventional Radiology

## 2018-12-27 ENCOUNTER — Other Ambulatory Visit (HOSPITAL_COMMUNITY): Payer: Self-pay | Admitting: Interventional Radiology

## 2018-12-27 ENCOUNTER — Ambulatory Visit (HOSPITAL_COMMUNITY)
Admission: RE | Admit: 2018-12-27 | Discharge: 2018-12-27 | Disposition: A | Discharge status: Home / Self Care | Payer: Medicare Other | Source: Ambulatory Visit | Attending: Interventional Radiology | Admitting: Interventional Radiology

## 2018-12-27 VITALS — BP 129/82 | HR 126 | Temp 98.9°F | Resp 16 | Ht 64.0 in | Wt 120.2 lb

## 2018-12-27 DIAGNOSIS — Z90710 Acquired absence of both cervix and uterus: Secondary | ICD-10-CM

## 2018-12-27 DIAGNOSIS — K2270 Barrett's esophagus without dysplasia: Secondary | ICD-10-CM | POA: Diagnosis present

## 2018-12-27 DIAGNOSIS — I959 Hypotension, unspecified: Secondary | ICD-10-CM | POA: Diagnosis not present

## 2018-12-27 DIAGNOSIS — Z882 Allergy status to sulfonamides status: Secondary | ICD-10-CM

## 2018-12-27 DIAGNOSIS — Z833 Family history of diabetes mellitus: Secondary | ICD-10-CM

## 2018-12-27 DIAGNOSIS — I71019 Dissection of thoracic aorta, unspecified: Secondary | ICD-10-CM

## 2018-12-27 DIAGNOSIS — I713 Abdominal aortic aneurysm, ruptured, unspecified: Secondary | ICD-10-CM

## 2018-12-27 DIAGNOSIS — I729 Aneurysm of unspecified site: Secondary | ICD-10-CM

## 2018-12-27 DIAGNOSIS — D75839 Thrombocytosis, unspecified: Secondary | ICD-10-CM | POA: Diagnosis present

## 2018-12-27 DIAGNOSIS — E871 Hypo-osmolality and hyponatremia: Secondary | ICD-10-CM | POA: Diagnosis present

## 2018-12-27 DIAGNOSIS — Y848 Other medical procedures as the cause of abnormal reaction of the patient, or of later complication, without mention of misadventure at the time of the procedure: Secondary | ICD-10-CM | POA: Diagnosis present

## 2018-12-27 DIAGNOSIS — D473 Essential (hemorrhagic) thrombocythemia: Secondary | ICD-10-CM | POA: Diagnosis present

## 2018-12-27 DIAGNOSIS — I4819 Other persistent atrial fibrillation: Secondary | ICD-10-CM | POA: Diagnosis not present

## 2018-12-27 DIAGNOSIS — G8929 Other chronic pain: Secondary | ICD-10-CM | POA: Diagnosis present

## 2018-12-27 DIAGNOSIS — I7100 Dissection of unspecified site of aorta: Secondary | ICD-10-CM | POA: Diagnosis present

## 2018-12-27 DIAGNOSIS — Z885 Allergy status to narcotic agent status: Secondary | ICD-10-CM

## 2018-12-27 DIAGNOSIS — Z7901 Long term (current) use of anticoagulants: Secondary | ICD-10-CM | POA: Diagnosis not present

## 2018-12-27 DIAGNOSIS — Z79899 Other long term (current) drug therapy: Secondary | ICD-10-CM

## 2018-12-27 DIAGNOSIS — Z539 Procedure and treatment not carried out, unspecified reason: Secondary | ICD-10-CM | POA: Diagnosis present

## 2018-12-27 DIAGNOSIS — R918 Other nonspecific abnormal finding of lung field: Secondary | ICD-10-CM | POA: Diagnosis present

## 2018-12-27 DIAGNOSIS — E039 Hypothyroidism, unspecified: Secondary | ICD-10-CM | POA: Diagnosis present

## 2018-12-27 DIAGNOSIS — Z888 Allergy status to other drugs, medicaments and biological substances status: Secondary | ICD-10-CM

## 2018-12-27 DIAGNOSIS — I7123 Aneurysm of the descending thoracic aorta, without rupture: Secondary | ICD-10-CM | POA: Diagnosis present

## 2018-12-27 DIAGNOSIS — R42 Dizziness and giddiness: Secondary | ICD-10-CM | POA: Diagnosis not present

## 2018-12-27 DIAGNOSIS — Z9049 Acquired absence of other specified parts of digestive tract: Secondary | ICD-10-CM

## 2018-12-27 DIAGNOSIS — Z7289 Other problems related to lifestyle: Secondary | ICD-10-CM

## 2018-12-27 DIAGNOSIS — E785 Hyperlipidemia, unspecified: Secondary | ICD-10-CM | POA: Diagnosis present

## 2018-12-27 DIAGNOSIS — Z83511 Family history of glaucoma: Secondary | ICD-10-CM

## 2018-12-27 DIAGNOSIS — Z8249 Family history of ischemic heart disease and other diseases of the circulatory system: Secondary | ICD-10-CM

## 2018-12-27 DIAGNOSIS — I7101 Dissection of ascending aorta: Secondary | ICD-10-CM | POA: Diagnosis present

## 2018-12-27 DIAGNOSIS — T888XXA Other specified complications of surgical and medical care, not elsewhere classified, initial encounter: Principal | ICD-10-CM | POA: Diagnosis present

## 2018-12-27 DIAGNOSIS — I71012 Dissection of descending thoracic aorta: Secondary | ICD-10-CM

## 2018-12-27 DIAGNOSIS — I728 Aneurysm of other specified arteries: Secondary | ICD-10-CM | POA: Diagnosis not present

## 2018-12-27 DIAGNOSIS — Z20828 Contact with and (suspected) exposure to other viral communicable diseases: Secondary | ICD-10-CM | POA: Diagnosis present

## 2018-12-27 DIAGNOSIS — R946 Abnormal results of thyroid function studies: Secondary | ICD-10-CM | POA: Diagnosis present

## 2018-12-27 DIAGNOSIS — S35292A Major laceration of branches of celiac and mesenteric artery, initial encounter: Secondary | ICD-10-CM | POA: Diagnosis not present

## 2018-12-27 DIAGNOSIS — D509 Iron deficiency anemia, unspecified: Secondary | ICD-10-CM | POA: Diagnosis present

## 2018-12-27 DIAGNOSIS — Z8711 Personal history of peptic ulcer disease: Secondary | ICD-10-CM

## 2018-12-27 DIAGNOSIS — K449 Diaphragmatic hernia without obstruction or gangrene: Secondary | ICD-10-CM | POA: Diagnosis present

## 2018-12-27 DIAGNOSIS — R000 Tachycardia, unspecified: Secondary | ICD-10-CM | POA: Diagnosis not present

## 2018-12-27 DIAGNOSIS — Z881 Allergy status to other antibiotic agents status: Secondary | ICD-10-CM

## 2018-12-27 DIAGNOSIS — I10 Essential (primary) hypertension: Secondary | ICD-10-CM | POA: Diagnosis present

## 2018-12-27 DIAGNOSIS — Z79890 Hormone replacement therapy (postmenopausal): Secondary | ICD-10-CM

## 2018-12-27 DIAGNOSIS — Z886 Allergy status to analgesic agent status: Secondary | ICD-10-CM

## 2018-12-27 DIAGNOSIS — I4892 Unspecified atrial flutter: Secondary | ICD-10-CM | POA: Diagnosis not present

## 2018-12-27 DIAGNOSIS — F329 Major depressive disorder, single episode, unspecified: Secondary | ICD-10-CM | POA: Diagnosis present

## 2018-12-27 DIAGNOSIS — K219 Gastro-esophageal reflux disease without esophagitis: Secondary | ICD-10-CM | POA: Diagnosis present

## 2018-12-27 DIAGNOSIS — I714 Abdominal aortic aneurysm, without rupture, unspecified: Secondary | ICD-10-CM | POA: Diagnosis present

## 2018-12-27 DIAGNOSIS — I480 Paroxysmal atrial fibrillation: Secondary | ICD-10-CM

## 2018-12-27 DIAGNOSIS — F419 Anxiety disorder, unspecified: Secondary | ICD-10-CM | POA: Diagnosis present

## 2018-12-27 DIAGNOSIS — Z8349 Family history of other endocrine, nutritional and metabolic diseases: Secondary | ICD-10-CM

## 2018-12-27 HISTORY — PX: IR EMBO ARTERIAL NOT HEMORR HEMANG INC GUIDE ROADMAPPING: IMG5448

## 2018-12-27 HISTORY — PX: IR US GUIDE VASC ACCESS RIGHT: IMG2390

## 2018-12-27 LAB — CBC
HCT: 41.7 % (ref 36.0–46.0)
Hemoglobin: 13.8 g/dL (ref 12.0–15.0)
MCH: 30.9 pg (ref 26.0–34.0)
MCHC: 33.1 g/dL (ref 30.0–36.0)
MCV: 93.5 fL (ref 80.0–100.0)
Platelets: 546 10*3/uL — ABNORMAL HIGH (ref 150–400)
RBC: 4.46 MIL/uL (ref 3.87–5.11)
RDW: 15.7 % — ABNORMAL HIGH (ref 11.5–15.5)
WBC: 7.2 10*3/uL (ref 4.0–10.5)
nRBC: 0 % (ref 0.0–0.2)

## 2018-12-27 LAB — BASIC METABOLIC PANEL
Anion gap: 9 (ref 5–15)
BUN: 13 mg/dL (ref 8–23)
CO2: 25 mmol/L (ref 22–32)
Calcium: 9.1 mg/dL (ref 8.9–10.3)
Chloride: 100 mmol/L (ref 98–111)
Creatinine, Ser: 0.71 mg/dL (ref 0.44–1.00)
GFR calc Af Amer: >60 mL/min (ref >60)
GFR calc non Af Amer: >60 mL/min (ref >60)
Glucose, Bld: 70 mg/dL (ref 70–99)
Potassium: 3.9 mmol/L (ref 3.5–5.1)
Sodium: 134 mmol/L — ABNORMAL LOW (ref 135–145)

## 2018-12-27 MED ORDER — NALOXONE HCL 0.4 MG/ML IJ SOLN: 0.4000 mg | INTRAMUSCULAR | Status: DC | PRN

## 2018-12-27 MED ORDER — COLESTIPOL HCL 1 G PO TABS: 2.0000 g | ORAL_TABLET | Freq: Two times a day (BID) | ORAL | Status: DC

## 2018-12-27 MED ORDER — ACETAMINOPHEN 650 MG RE SUPP: 650.0000 mg | Freq: Four times a day (QID) | RECTAL | Status: DC | PRN

## 2018-12-27 MED ORDER — DILTIAZEM HCL ER 60 MG PO CP12
120.0000 mg | ORAL_CAPSULE | Freq: Every day | ORAL | Status: DC
  Administered 2018-12-28 – 2018-12-29 (×2): 120 mg via ORAL
  Filled 2018-12-27 (×2): qty 2

## 2018-12-27 MED ORDER — SODIUM CHLORIDE 0.9% FLUSH
3.0000 mL | Freq: Two times a day (BID) | INTRAVENOUS | Status: DC
  Administered 2018-12-29 – 2019-01-04 (×8): 3 mL via INTRAVENOUS

## 2018-12-27 MED ORDER — IOHEXOL 350 MG/ML SOLN
70.0000 mL | Freq: Once | INTRAVENOUS | Status: AC | PRN
  Administered 2018-12-27: 10:00:00 70 mL via INTRAVENOUS

## 2018-12-27 MED ORDER — DIPHENHYDRAMINE HCL 12.5 MG/5ML PO ELIX
12.5000 mg | ORAL_SOLUTION | Freq: Four times a day (QID) | ORAL | Status: DC | PRN
  Filled 2018-12-27: qty 5

## 2018-12-27 MED ORDER — IOHEXOL 300 MG/ML  SOLN
150.0000 mL | Freq: Once | INTRAMUSCULAR | Status: AC | PRN
  Administered 2018-12-27: 1 mL via INTRA_ARTERIAL

## 2018-12-27 MED ORDER — DIPHENHYDRAMINE HCL 12.5 MG/5ML PO ELIX: 12.5000 mg | ORAL_SOLUTION | Freq: Four times a day (QID) | ORAL | Status: DC | PRN

## 2018-12-27 MED ORDER — FENTANYL CITRATE (PF) 100 MCG/2ML IJ SOLN
INTRAMUSCULAR | Status: AC
  Filled 2018-12-27: qty 2

## 2018-12-27 MED ORDER — PANTOPRAZOLE SODIUM 40 MG PO TBEC: 40.0000 mg | DELAYED_RELEASE_TABLET | Freq: Every day | ORAL | Status: DC

## 2018-12-27 MED ORDER — ONDANSETRON HCL 4 MG/2ML IJ SOLN
4.0000 mg | Freq: Once | INTRAMUSCULAR | Status: AC
  Administered 2018-12-27: 11:00:00 4 mg via INTRAVENOUS

## 2018-12-27 MED ORDER — ACETAMINOPHEN 325 MG PO TABS
650.0000 mg | ORAL_TABLET | Freq: Four times a day (QID) | ORAL | Status: DC | PRN
  Administered 2018-12-28 – 2019-01-04 (×13): 650 mg via ORAL
  Filled 2018-12-27 (×13): qty 2

## 2018-12-27 MED ORDER — FENTANYL CITRATE (PF) 100 MCG/2ML IJ SOLN
INTRAMUSCULAR | Status: DC | PRN
  Administered 2018-12-27 (×3): 25 ug via INTRAVENOUS

## 2018-12-27 MED ORDER — COLESTIPOL HCL 1 G PO TABS
2.0000 g | ORAL_TABLET | Freq: Two times a day (BID) | ORAL | Status: DC
  Administered 2018-12-28 – 2019-01-04 (×15): 2 g via ORAL
  Filled 2018-12-27 (×16): qty 2

## 2018-12-27 MED ORDER — ONDANSETRON HCL 4 MG/2ML IJ SOLN
INTRAMUSCULAR | Status: AC
  Administered 2018-12-27: 4 mg via INTRAVENOUS
  Filled 2018-12-27: qty 2

## 2018-12-27 MED ORDER — ONDANSETRON HCL 4 MG/2ML IJ SOLN
4.0000 mg | Freq: Four times a day (QID) | INTRAMUSCULAR | Status: DC | PRN
  Administered 2018-12-27: 13:00:00 4 mg via INTRAVENOUS

## 2018-12-27 MED ORDER — ONDANSETRON HCL 4 MG PO TABS
4.0000 mg | ORAL_TABLET | Freq: Four times a day (QID) | ORAL | Status: DC | PRN
  Administered 2019-01-03: 19:00:00 4 mg via ORAL
  Filled 2018-12-27: qty 1

## 2018-12-27 MED ORDER — HYDRALAZINE HCL 20 MG/ML IJ SOLN: 5.0000 mg | INTRAMUSCULAR | Status: DC | PRN

## 2018-12-27 MED ORDER — HYDRALAZINE HCL 20 MG/ML IJ SOLN
INTRAMUSCULAR | Status: AC | PRN
  Administered 2018-12-27: 10 mg via INTRAVENOUS

## 2018-12-27 MED ORDER — LIDOCAINE HCL 1 % IJ SOLN
INTRAMUSCULAR | Status: AC
  Filled 2018-12-27: qty 20

## 2018-12-27 MED ORDER — PROMETHAZINE HCL 25 MG/ML IJ SOLN
12.5000 mg | Freq: Four times a day (QID) | INTRAMUSCULAR | Status: DC | PRN
  Administered 2018-12-27: 21:00:00 12.5 mg via INTRAVENOUS
  Filled 2018-12-27: qty 1

## 2018-12-27 MED ORDER — DIPHENHYDRAMINE HCL 50 MG/ML IJ SOLN: 12.5000 mg | Freq: Four times a day (QID) | INTRAMUSCULAR | Status: DC | PRN

## 2018-12-27 MED ORDER — MIDAZOLAM HCL 2 MG/2ML IJ SOLN
INTRAMUSCULAR | Status: AC
  Filled 2018-12-27: qty 2

## 2018-12-27 MED ORDER — ALBUTEROL SULFATE (2.5 MG/3ML) 0.083% IN NEBU: 2.5000 mg | INHALATION_SOLUTION | Freq: Four times a day (QID) | RESPIRATORY_TRACT | Status: DC | PRN

## 2018-12-27 MED ORDER — FENTANYL CITRATE (PF) 100 MCG/2ML IJ SOLN
INTRAMUSCULAR | Status: AC | PRN
  Administered 2018-12-27: 50 ug via INTRAVENOUS

## 2018-12-27 MED ORDER — LEVOTHYROXINE SODIUM 75 MCG PO TABS: 75.0000 ug | ORAL_TABLET | Freq: Every day | ORAL | Status: DC

## 2018-12-27 MED ORDER — LATANOPROST 0.005 % OP SOLN: 1.0000 [drp] | Freq: Every day | OPHTHALMIC | Status: DC

## 2018-12-27 MED ORDER — LEVOTHYROXINE SODIUM 75 MCG PO TABS
75.0000 ug | ORAL_TABLET | Freq: Every day | ORAL | Status: DC
  Administered 2018-12-28 – 2019-01-04 (×8): 75 ug via ORAL
  Filled 2018-12-27 (×8): qty 1

## 2018-12-27 MED ORDER — IOHEXOL 300 MG/ML  SOLN
100.0000 mL | Freq: Once | INTRAMUSCULAR | Status: AC | PRN
  Administered 2018-12-27: 60 mL via INTRA_ARTERIAL

## 2018-12-27 MED ORDER — PROPAFENONE HCL 225 MG PO TABS: 225.0000 mg | ORAL_TABLET | Freq: Two times a day (BID) | ORAL | Status: DC

## 2018-12-27 MED ORDER — RALOXIFENE HCL 60 MG PO TABS: 60.0000 mg | ORAL_TABLET | Freq: Every day | ORAL | Status: DC

## 2018-12-27 MED ORDER — FENTANYL 40 MCG/ML IV SOLN: INTRAVENOUS | Status: DC

## 2018-12-27 MED ORDER — TIMOLOL HEMIHYDRATE 0.5 % OP SOLN: 1.0000 [drp] | Freq: Every day | OPHTHALMIC | Status: DC

## 2018-12-27 MED ORDER — SODIUM CHLORIDE 0.9% FLUSH
3.0000 mL | Freq: Two times a day (BID) | INTRAVENOUS | Status: DC
  Administered 2018-12-27: 21:00:00 10 mL via INTRAVENOUS
  Administered 2018-12-28 – 2019-01-04 (×9): 3 mL via INTRAVENOUS

## 2018-12-27 MED ORDER — RALOXIFENE HCL 60 MG PO TABS
60.0000 mg | ORAL_TABLET | Freq: Every day | ORAL | Status: DC
  Administered 2018-12-28 – 2019-01-04 (×8): 60 mg via ORAL
  Filled 2018-12-27 (×8): qty 1

## 2018-12-27 MED ORDER — HYDRALAZINE HCL 20 MG/ML IJ SOLN
5.0000 mg | INTRAMUSCULAR | Status: DC | PRN
  Administered 2018-12-27 – 2019-01-03 (×3): 10 mg via INTRAVENOUS
  Filled 2018-12-27 (×3): qty 1

## 2018-12-27 MED ORDER — EZETIMIBE 10 MG PO TABS: 10.0000 mg | ORAL_TABLET | Freq: Every day | ORAL | Status: DC

## 2018-12-27 MED ORDER — PANTOPRAZOLE SODIUM 40 MG PO TBEC
40.0000 mg | DELAYED_RELEASE_TABLET | Freq: Every day | ORAL | Status: DC
  Administered 2018-12-28 – 2019-01-04 (×8): 40 mg via ORAL
  Filled 2018-12-27 (×8): qty 1

## 2018-12-27 MED ORDER — DILTIAZEM HCL ER 60 MG PO CP12: 120.0000 mg | ORAL_CAPSULE | Freq: Every day | ORAL | Status: DC

## 2018-12-27 MED ORDER — SODIUM CHLORIDE 0.9 % IV SOLN
INTRAVENOUS | Status: DC
  Administered 2018-12-27: 21:00:00 via INTRAVENOUS

## 2018-12-27 MED ORDER — FENTANYL 40 MCG/ML IV SOLN
INTRAVENOUS | Status: DC
  Administered 2018-12-27: 20 ug via INTRAVENOUS
  Administered 2018-12-27: 21:00:00 via INTRAVENOUS
  Administered 2018-12-28 (×2): 10 ug via INTRAVENOUS
  Filled 2018-12-27: qty 25

## 2018-12-27 MED ORDER — PROPAFENONE HCL 225 MG PO TABS
225.0000 mg | ORAL_TABLET | Freq: Two times a day (BID) | ORAL | Status: DC
  Administered 2018-12-28 – 2018-12-31 (×8): 225 mg via ORAL
  Filled 2018-12-27 (×8): qty 1

## 2018-12-27 MED ORDER — PROMETHAZINE HCL 25 MG/ML IJ SOLN: 12.5000 mg | Freq: Four times a day (QID) | INTRAMUSCULAR | Status: DC | PRN

## 2018-12-27 MED ORDER — SODIUM CHLORIDE 0.9% FLUSH: 9.0000 mL | INTRAVENOUS | Status: DC | PRN

## 2018-12-27 MED ORDER — EZETIMIBE 10 MG PO TABS
10.0000 mg | ORAL_TABLET | Freq: Every day | ORAL | Status: DC
  Administered 2018-12-28 – 2019-01-04 (×8): 10 mg via ORAL
  Filled 2018-12-27 (×8): qty 1

## 2018-12-27 MED ORDER — LIDOCAINE HCL (PF) 1 % IJ SOLN
INTRAMUSCULAR | Status: DC | PRN
  Administered 2018-12-27: 5 mL

## 2018-12-27 MED ORDER — SODIUM CHLORIDE 0.9% FLUSH
3.0000 mL | Freq: Two times a day (BID) | INTRAVENOUS | Status: DC
  Administered 2018-12-27: 3 mL via INTRAVENOUS

## 2018-12-27 MED ORDER — ACETAMINOPHEN 325 MG PO TABS: 650.0000 mg | ORAL_TABLET | Freq: Four times a day (QID) | ORAL | Status: DC | PRN

## 2018-12-27 MED ORDER — MIDAZOLAM HCL 2 MG/2ML IJ SOLN
INTRAMUSCULAR | Status: DC | PRN
  Administered 2018-12-27: 0.5 mg via INTRAVENOUS
  Administered 2018-12-27: 1 mg via INTRAVENOUS

## 2018-12-27 MED ORDER — ONDANSETRON HCL 4 MG/2ML IJ SOLN
4.0000 mg | Freq: Four times a day (QID) | INTRAMUSCULAR | Status: DC | PRN
  Administered 2018-12-28: 4 mg via INTRAVENOUS
  Filled 2018-12-27: qty 2

## 2018-12-27 MED ORDER — HYDRALAZINE HCL 20 MG/ML IJ SOLN: 10.0000 mg | INTRAMUSCULAR | Status: DC | PRN

## 2018-12-27 MED ORDER — HYDRALAZINE HCL 20 MG/ML IJ SOLN
INTRAMUSCULAR | Status: AC
  Filled 2018-12-27: qty 1

## 2018-12-27 MED ORDER — TIMOLOL MALEATE 0.5 % OP SOLN
1.0000 [drp] | Freq: Every day | OPHTHALMIC | Status: DC
  Administered 2018-12-28 – 2019-01-04 (×8): 1 [drp] via OPHTHALMIC
  Filled 2018-12-27: qty 5

## 2018-12-27 MED ORDER — ONDANSETRON HCL 4 MG/2ML IJ SOLN
INTRAMUSCULAR | Status: AC
  Filled 2018-12-27: qty 2

## 2018-12-27 MED ORDER — LATANOPROST 0.005 % OP SOLN
1.0000 [drp] | Freq: Every day | OPHTHALMIC | Status: DC
  Administered 2018-12-28 – 2019-01-03 (×8): 1 [drp] via OPHTHALMIC
  Filled 2018-12-27: qty 2.5

## 2018-12-27 MED ORDER — ONDANSETRON HCL 4 MG/2ML IJ SOLN: 4.0000 mg | Freq: Four times a day (QID) | INTRAMUSCULAR | Status: DC | PRN

## 2018-12-27 MED ORDER — ONDANSETRON HCL 4 MG PO TABS: 4.0000 mg | ORAL_TABLET | Freq: Four times a day (QID) | ORAL | Status: DC | PRN

## 2018-12-27 NOTE — H&P (Addendum)
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 Findings: Mediastinum/Lymph Nodes: No bulky mediastinal, hilar or axillary lymphadenopathy. Lungs/Pleura: Scattered pulmonary nodules are seen bilaterally some of which demonstrate a tree-in-bud configuration (representative images 27 and 28, series 5). Dominant right upper lobe pulmonary nodule measures 0.6 cm in diameter (image 28, series 5 while dominant left upper lobe pulmonary nodule measures 0.4 cm (image 26, series 5). Minimal dependent subsegmental atelectasis. No discrete focal airspace opacities. No pleural effusion or pneumothorax. The central pulmonary airways appear widely patent. Musculoskeletal: No acute or aggressive osseous abnormalities. Mild-to-moderate multilevel DDD within midthoracic spine with associated mildly accentuated kyphosis. Regional soft tissues appear normal. Normal appearance of the thyroid gland. _________________________________________________________  _________________________________________________________ CTA ABDOMEN AND PELVIS FINDINGS VASCULAR Aorta: As above, there is extension of the intramural hematoma throughout the abdominal aorta to the level of the right common and external iliac arteries. Review of the precontrast images demonstrates high-density contrast material scattered within the false lumen of the abdominal aortic dissection, the origin and proximal aspect of the right renal artery as well as the right external iliac artery as a sequela of subintimal contrast administration during preceding attempted mesenteric arteriogram. No contrast extravasation or significant periaortic stranding. Scattered atherosclerotic plaque throughout the normal caliber abdominal aorta, not resulting in a hemodynamically significant stenosis. Celiac: Widely patent without a hemodynamically significant narrowing. A replaced right hepatic artery is noted to arise from the proximal SMA. The splenic artery is again noted to be markedly tortuous and supplies a grossly unchanged approximately 4.7 x 3.9 x 4.7 cm pseudoaneurysm arising from a cranial division of the splenic artery, grossly unchanged compared to the 12/07/2018 examination, previously, 4.4 x 3.5 x 4.9 cm. Redemonstrated moderate to large amount of mural thrombus within the dominant component of the aneurysm. No definitive perianeurysmal stranding. SMA: Widely patent without hemodynamically significant narrowing. A replaced right hepatic artery is noted to arise from the  proximal SMA. The distal tributaries of the SMA appear widely patent without discrete intraluminal filling defect to suggest distal embolism. Renals: Solitary bilaterally; review of the precontrast images demonstrates high density contrast material involving the origin and proximal aspects of the right renal artery as a sequela of subintimal contrast administration during attempted mesenteric arteriogram. The intramural hematoma results in  suspected approximately 50% luminal narrowing of the right renal artery though the vessel remains patent without evidence of distal embolism. The left renal artery appears widely patent without hemodynamically significant narrowing. IMA: Disease at its origin though remains patent. Additionally, there is proximal collateral supply from the SMA. Inflow: Review of the precontrast images demonstrates high contrast material involving the right common iliac artery as a sequela of subintimal contrast administration during preceding attempted mesenteric arteriogram. There is a non flow limiting short-segment dissection involving the right common (image 141, series 8) and external iliac arteries (image 146, series 8). There is a moderate amount of eccentric mixed calcified and noncalcified atherosclerotic plaque involving the bilateral common iliac arteries, not resulting in a hemodynamically significant stenosis. The bilateral internal iliac arteries are diseased though patent of normal caliber. The bilateral external iliac arteries are tortuous though widely patent without a hemodynamically significant narrowing. The arterial sheath enters the proximal most aspect of the right superficial femoral artery (image 175, series 8). The bilateral common femoral and imaged portions of the bilateral superficial and deep femoral arteries appear widely patent throughout their imaged courses. Veins: The IVC and pelvic venous system appear widely on this arterial phase examination. Review of the MIP images confirms the above findings. _________________________________________________________ NON-VASCULAR Hepatobiliary: Normal hepatic contour. No discrete hepatic lesions. Post cholecystectomy with persistent dilatation of the CBD and intrahepatic biliary system, presumably the sequela of biliary reservoir phenomena. No ascites. Pancreas: There is persistent dilatation of the central aspect the pancreatic duct, similar to the 08/2015  examination and without definitive pancreatic lesion on this non pancreatic protocol CT scan. No discrete peripancreatic stranding. 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 No perisplenic stranding. Adrenals/Urinary Tract: Precontrast images demonstrate subintimal contrast involving the origin and proximal aspects of the right renal artery with associated right renal nephrogram however fortunately arterial and delayed venous phase imaging demonstrates symmetric enhancement and excretion of the bilateral kidneys with patency of the right renal artery. Note is made of a punctate (approximately 0.2 cm) nonobstructing right-sided renal stone. No evidence of left-sided nephrolithiasis. Note is made of an approximately 4.0 cm hypoattenuating right-sided renal cyst. No evidence urinary obstruction or perinephric stranding. Normal appearance the bilateral adrenal glands. Excreted contrast is seen within otherwise normal-appearing urinary bladder. Stomach/Bowel: Moderate to large colonic stool burden without evidence of enteric obstruction. No pneumoperitoneum, pneumatosis or portal venous gas. Normal appearance of the terminal ileum. The appendix is not definitively identified however there is no pericecal inflammatory change. Lymphatic: No bulky retroperitoneal, mesenteric, pelvic or inguinal lymphadenopathy. Reproductive: Post hysterectomy. No discrete adnexal lesion. Small amount of presumably reactive free fluid in the pelvic cul-de-sac. Other: Regional soft tissues appear normal. Musculoskeletal: No acute or aggressive osseous abnormalities. Moderate scoliotic curvature of the thoracolumbar spine with moderate to severe multilevel lumbar spine DDD, worse at L2-L3 and L3-L4 with disc space height loss, endplate irregularity and sclerosis. Review of the MIP images confirms the above findings. IMPRESSION: Vascular  Impression: 1. Acute intramural hematoma/type B thoracic aortic dissection extending from the takeoff of the left subclavian  artery to the level of the right external iliac artery. There is minimal opacification of the intramural hematoma/false lumen at the level of the distal descending thoracic aorta with otherwise non opacification of false lumen. Minimal amount of perivascular stranding at the level of the thoracic aorta but without evidence of contrast extravasation. 2. Review of the precontrast images demonstrate high-density contrast material within the false lumen of the abdominal aorta, the origin and proximal aspects of the right renal artery as well as the right external iliac artery, as a sequela of subintimal contrast administration during preceding attempted mesenteric arteriogram. 3. Intramural hematoma results in approximately 50% luminal narrowing of the origin the right renal artery. The remaining branch vessels of the abdominal aorta appear widely patent a without hemodynamically significant narrowing. 4. Non flow limiting dissection involving the right common and external iliac arteries. 5. 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 6.  Aortic Atherosclerosis (ICD10-I70.0). 7. Borderline cardiomegaly. Nonvascular Impression: 1. Indeterminate bilateral pulmonary nodules, largest of which within the right upper lobe measures 6 mm in diameter. By report, pulmonary nodules have been demonstrated on outside examinations and as such, correlation with prior outside examinations is advised. Otherwise, a non-contrast chest CT at 3-6 months is recommended. If the nodules are stable at time of repeat CT, then future CT at 18-24 months (from today's scan) is considered optional for low-risk patients, but is recommended for high-risk patients. This recommendation follows the  consensus statement: Guidelines for Management of Incidental Pulmonary Nodules Detected on CT Images: From the Fleischner Society 2017; Radiology 2017; 284:228-243. Electronically Signed   By: Sandi Mariscal M.D.   On: 12/27/2018 14:46    EKG: Independently reviewed.  Normal sinus rhythm at 62 beats.  Assessment/Plan Dissection of ascending aorta: Acute.  CT angiogram revealed an acute intramural hematoma/type B thoracic aortic dissection.  Patient remained hemodynamically stable.  Vascular surgery consulted.  -Admit to a progressive bed  -Continuous pulse oximetry with nasal cannula oxygen as needed. -Strict control of heart rate <78 and systolic blood pressure between 100-120.  -Hydralazine IV as needed -Reduced dose fentanyl PCA per protocol -Appreciate vascular surgery, will follow-up for further recommendations  Pseudoaneurysm of the splenic artery: Patient initially presented for intervention on a moderate pseudoaneurysm of the splenic artery by interventional radiology. -Follow-up with IR after above is stabilized   Paroxysmal atrial fibrillation on chronic anticoagulation: Patient has been off of Eliquis since yesterday. -Continue home medications of propafenone and Cardizem. -Hold Eliquis  Hypothyroidism: Last TSH 1.925 on 10/17/2017. -Continue levothyroxine  Hyponatremia: Acute. Sodium is just mildly low at 134. -Continue to monitor  Thrombocytosis: Chronic. platelet count initially 546 which more so chronic in nature on reviewing previous records. -Continue to monitor  Pulmonary nodules: Incidental finding on imaging studies performed today. -Recommend repeat CT scan of the chest in 3 to 6 months  DVT prophylaxis: SCDs Code Status: Full Family Communication: None Disposition Plan: Possible discharge home in 1 to 2 days. Consults called: Vascular surgery Admission status: Observation  Norval Morton MD [facility] Pager [phone]   If 7PM-7AM, please  contact night-coverage [email or web address] Password [facility]  12/27/2018, 7:48 PM

## 2018-12-27 NOTE — Progress Notes (Signed)
Dr. Tamala Julian notified of patient N/V after Zofran given and persistent back and abdominal pain. MD informed that patient does have a order for Fentanyl but she feels that is has contributed to her nausea. Order written for phenergan.

## 2018-12-27 NOTE — Progress Notes (Signed)
Day shift RN notified hospitalist of admission and needing orders as of 7:15pm.

## 2018-12-27 NOTE — Progress Notes (Signed)
Pt has remained stable while in radiology, She continues to complain of back pain but the pain medication makes her nauseas and causes her to vomit. She's had approx 5 episodes of emesis. Drs Tamala Julian [facility]) and Early (vascular) have been in to speak with pt. Telephone report given to 4E RN. Pt requested her personal phone to call her sister, they talked for a while. Will transport pt up to 4E.

## 2018-12-27 NOTE — Sedation Documentation (Signed)
Dr Pascal Lux speaking to pt at this time. We Will assist pt back to stretcher and transport to CT for stat scan. Right groin 38F sheath in place. Groin level 0. Pt is awake and alert. Will continue to monitor

## 2018-12-27 NOTE — Consult Note (Signed)
[facility]  Patient name: Megan Mercer MRN: [id number] DOB: Dec 17, 1935 Sex: female    HPI: Megan Mercer is a 83 y.o. female scheduled today for elective treatment of large splenic artery aneurysm.  She had right groin access.  There was inadvertent dissection of the artery and on injection there was infiltration inside the aortic wall.  The sheath was left in place and the patient underwent CT scan for further evaluation.  She does have complain of some discomfort but was hemodynamically stable.  The patient does have chronic back pain.  She is uncomfortable now with back pain since she has been lying in her bed all day.  She had nausea and vomiting with her pain medication.  Past Medical History:  Diagnosis Date  . Anemia   . Anxiety   . Arthritis   . Atrial fibrillation ([place])   . Barrett's esophagus   . Blood transfusion without reported diagnosis   . Complication of anesthesia   . Dysrhythmia   . GERD (gastroesophageal reflux disease)   . Glaucoma   . Hiatal hernia   . HLD (hyperlipidemia)   . HTN (hypertension)   . Hypothyroidism   . Internal hemorrhoids   . Iron deficiency anemia   . Peptic ulcer disease with hemorrhage   . PONV (postoperative nausea and vomiting)    Confusion, Combativeness  . UTI (urinary tract infection)     Family History  Problem Relation Age of Onset  . Cancer Father        blood cancer, ? type  . Rheum arthritis Father   . Glaucoma Father   . Rheum arthritis Mother   . Arthritis Sister   . Diabetes Sister        TYPE 2  . Heart failure Sister   . Hyperlipidemia Sister   . Colon cancer Neg Hx   . Esophageal cancer Neg Hx   . Pancreatic cancer Neg Hx   . Stomach cancer Neg Hx   . Liver disease Neg Hx     SOCIAL HISTORY: Social History   Tobacco Use  . Smoking status: Never Smoker  . Smokeless tobacco: Never Used  Substance Use Topics  . Alcohol use:  Yes    Alcohol/week: 0.0 standard drinks    Comment: rare    Allergies  Allergen Reactions  . Ace Inhibitors Cough  . Amoxicillin Other (See Comments)    Did it involve swelling of the face/tongue/throat, SOB, or low BP? No Did it involve sudden or severe rash/hives, skin peeling, or any reaction on the inside of your mouth or nose? No Did you need to seek medical attention at a hospital or doctor's office? No When did it last happen? If all above answers are "NO", may proceed with cephalosporin use. 3  . Aspirin   . Atorvastatin Other (See Comments)    Other reaction(s): Arthralgia (Joint Pain) Other reaction(s): Arthralgia (Joint Pain)  . Crestor  [Rosuvastatin Calcium]   . Iodine Nausea Only  . Lipitor  [Atorvastatin Calcium]     Other reaction(s): Arthralgia (joint pain)  . Oxycodone Nausea And Vomiting  . Oxycodone-Acetaminophen Nausea And Vomiting  . Paroxetine Hcl Nausea Only    Intolerance= Nausea, Vomitting  . Sulfa Antibiotics Nausea And Vomiting  . Sulfasalazine Nausea And Vomiting  . Codeine Nausea And Vomiting    No current facility-administered medications for this encounter.    Facility-Administered Medications Ordered in Other Encounters  Medication Dose Route Frequency  Provider Last Rate Last Dose  . 0.9 %  sodium chloride infusion   Intravenous Continuous Smith, Rondell A, MD      . acetaminophen (TYLENOL) tablet 650 mg  650 mg Oral Q6H PRN Norval Morton, MD       Or  . acetaminophen (TYLENOL) suppository 650 mg  650 mg Rectal Q6H PRN Smith, Rondell A, MD      . albuterol (PROVENTIL) (2.5 MG/3ML) 0.083% nebulizer solution 2.5 mg  2.5 mg Nebulization Q6H PRN Tamala Julian, Rondell A, MD      . colestipol (COLESTID) tablet 2 g  2 g Oral BID Fuller Plan A, MD      . Derrill Memo ON 12/28/2018] diltiazem (CARDIZEM SR) 12 hr capsule 120 mg  120 mg Oral Daily Smith, Rondell A, MD      . diphenhydrAMINE (BENADRYL) injection 12.5 mg  12.5 mg Intravenous Q6H PRN  Smith, Rondell A, MD       Or  . diphenhydrAMINE (BENADRYL) 12.5 MG/5ML elixir 12.5 mg  12.5 mg Oral Q6H PRN Smith, Rondell A, MD      . ezetimibe (ZETIA) tablet 10 mg  10 mg Oral Daily Smith, Rondell A, MD      . fentaNYL (SUBLIMAZE) 100 MCG/2ML injection           . fentaNYL (SUBLIMAZE) 100 MCG/2ML injection           . fentaNYL 40 mcg/mL PCA injection   Intravenous Q4H Smith, Rondell A, MD      . hydrALAZINE (APRESOLINE) 20 MG/ML injection           . hydrALAZINE (APRESOLINE) injection 5-10 mg  5-10 mg Intravenous Q2H PRN Smith, Rondell A, MD      . latanoprost (XALATAN) 0.005 % ophthalmic solution 1 drop  1 drop Both Eyes QHS Smith, Rondell A, MD      . Derrill Memo ON 12/28/2018] levothyroxine (SYNTHROID) tablet 75 mcg  75 mcg Oral QAC breakfast Smith, Rondell A, MD      . lidocaine (PF) (XYLOCAINE) 1 % injection    PRN Sandi Mariscal, MD   5 mL at 12/27/18 0930  . lidocaine (XYLOCAINE) 1 % (with pres) injection           . midazolam (VERSED) 2 MG/2ML injection           . naloxone (NARCAN) injection 0.4 mg  0.4 mg Intravenous PRN Smith, Rondell A, MD       And  . sodium chloride flush (NS) 0.9 % injection 9 mL  9 mL Intravenous PRN Smith, Rondell A, MD      . ondansetron (ZOFRAN) 4 MG/2ML injection           . ondansetron (ZOFRAN) tablet 4 mg  4 mg Oral Q6H PRN Fuller Plan A, MD       Or  . ondansetron (ZOFRAN) injection 4 mg  4 mg Intravenous Q6H PRN Smith, Rondell A, MD      . ondansetron (ZOFRAN) injection 4 mg  4 mg Intravenous Q6H PRN Fuller Plan A, MD   4 mg at 12/27/18 1325  . pantoprazole (PROTONIX) EC tablet 40 mg  40 mg Oral Daily Smith, Rondell A, MD      . promethazine (PHENERGAN) injection 12.5 mg  12.5 mg Intravenous Q6H PRN Smith, Rondell A, MD      . propafenone (RYTHMOL) tablet 225 mg  225 mg Oral BID Tamala Julian, Rondell A, MD      . raloxifene (EVISTA) tablet  60 mg  60 mg Oral Daily Smith, Rondell A, MD      . sodium chloride flush (NS) 0.9 % injection 3 mL  3 mL Intravenous  Q12H Smith, Rondell A, MD      . timolol (BETIMOL) 0.5 % ophthalmic solution 1 drop  1 drop Both Eyes Daily Smith, Rondell A, MD        REVIEW OF SYSTEMS:  Reviewed in her history and physical with nothing to add PHYSICAL EXAM: Vitals:   12/27/18 1045 12/27/18 1055 12/27/18 1058 12/27/18 1100  BP: (!) 179/91 (!) 138/115 (!) 157/74 (!) 141/78  Pulse: 70 69 78 69  Resp: 16 13 20 20   SpO2: 100% 100% 100% 100%    GENERAL: The patient is a well-nourished female, in moderate distress from chronic back pain. The vital signs are documented above. CARDIOVASCULAR: 2+ radial and 2+ femoral pulses bilaterally.  No evidence of hematoma or false aneurysm in her right groin.  2+ popliteal pulses bilaterally. Abdomen soft with no tenderness PULMONARY: There is good air exchange  MUSCULOSKELETAL: There are no major deformities or cyanosis. NEUROLOGIC: No focal weakness or paresthesias are detected. SKIN: There are no ulcers or rashes noted. PSYCHIATRIC: The patient has a normal affect.  DATA:  CT scan shows intramural hematoma in the thoracic aorta.  This begins distal to the left subclavian artery.  No evidence of extravasation.  There is normal flow into the superior mesenteric and celiac artery.  Mild narrowing of the right renal artery.  Some irregularity in the right common and external iliac artery but no evidence of extravasation  Large splenic artery aneurysm  MEDICAL ISSUES: Patient to be admitted for blood pressure management and pain medication.  Suspect that this will completely resolve with time.  She is questioning the need for treatment of splenic artery and and I explained that my recommendation would be to allow complete resolution of her current issue and then readdress her splenic artery electively.  We will follow along.  No role for surgical treatment with no evidence of extravasation and no evidence of malperfusion    [name], MD FACS [facility]  [facility] Tel [phone] Pager [phone]

## 2018-12-27 NOTE — Procedures (Signed)
Pre-procedure Diagnosis: Splenic artery pseudoanneursym Post-procedure Diagnosis: Same; Type B thoracic aortic dissection (traumatic)  Post attempted mesenteric arteriogram and splenic artery pseudoanneursym -> procedure aborted d/t access related complication.  Complications: Procedure complicated by creation of a Type B thoracic aortic dissection during attempted arterial access with sub-intimal advancement of the vascular sheath.   EBL: Trace  Keep right leg straight for 4 hrs (util 1800)  Signed: Sandi Mariscal Pager: [phone] 12/27/2018, 12:04 PM

## 2018-12-27 NOTE — Progress Notes (Signed)
MD on call with Dr. Pascal Lux service made aware of patient in [id number] and needing admission orders will await call back. Yuritza Paulhus, Bettina Gavia RN

## 2018-12-27 NOTE — H&P (Signed)
Chief Complaint: Patient was seen in consultation today for splenic artery pseudoaneurysm  Referring Physician(s): Watts,John  Supervising Physician: Sandi Mariscal  Patient Status: [facility] - Out-pt  History of Present Illness: Megan Mercer is a 83 y.o. female with past medical history significant for an atrial fibrillation on Eliquis, hypertension, hyperlipidemia, GERD and peptic ulcer disease who recently underwent work-up for pancreatic mass in [place].  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  Patient was referred to IR and recently met with Dr. Pascal Lux in telephone consultation to discuss potential treatment options regarding her pseudoaneurysm. After discussion, she elects to proceed with intervention today.   She presents to [facility] today in her usual state of health. No new complaints.  She tells me she is nervous, but wishes to move forward.  She has been NPO. She last took her Eliquis on Sunday.    Past Medical History:  Diagnosis Date  . Anemia   . Anxiety   . Arthritis   . Atrial fibrillation ([place])   . Barrett's esophagus   . Blood transfusion without reported diagnosis   . Complication of anesthesia   . Dysrhythmia   . GERD (gastroesophageal reflux disease)   . Glaucoma   . Hiatal hernia   . HLD (hyperlipidemia)   . HTN (hypertension)   . Hypothyroidism   . Internal hemorrhoids   . Iron deficiency anemia   . Peptic ulcer disease with hemorrhage   . PONV (postoperative nausea and vomiting)    Confusion, Combativeness  . UTI (urinary tract infection)     Past Surgical History:  Procedure Laterality Date  . ABDOMINAL HYSTERECTOMY  1984  . APPENDECTOMY  2007  . CATARACT EXTRACTION Bilateral   . CHOLECYSTECTOMY  2007  . COLONOSCOPY    . ESOPHAGOGASTRODUODENOSCOPY (EGD) WITH PROPOFOL N/A 11/30/2018   Procedure:  ESOPHAGOGASTRODUODENOSCOPY (EGD) WITH PROPOFOL;  Surgeon: Milus Banister, MD;  Location: [facility];  Service: Endoscopy;  Laterality: N/A;  . EUS N/A 11/30/2018   Procedure: UPPER ENDOSCOPIC ULTRASOUND (EUS) RADIAL;  Surgeon: Milus Banister, MD;  Location: [facility];  Service: Endoscopy;  Laterality: N/A;  . IR RADIOLOGIST EVAL & MGMT  12/14/2018  . UPPER GASTROINTESTINAL ENDOSCOPY      Allergies: Ace inhibitors, Amoxicillin, Aspirin, Atorvastatin, Crestor  [rosuvastatin calcium], Iodine, Lipitor  [atorvastatin calcium], Oxycodone, Oxycodone-acetaminophen, Paroxetine hcl, Sulfa antibiotics, Sulfasalazine, and Codeine  Medications: Prior to Admission medications   Medication Sig Start Date End Date Taking? Authorizing Provider  apixaban (ELIQUIS) 2.5 MG TABS tablet Take 2.5 mg by mouth 2 (two) times daily.    Yes [provider]  Calcium Carb-Cholecalciferol (CALCIUM-VITAMIN D) 600-400 MG-UNIT TABS Take 1 tablet by mouth daily.   Yes [provider]  colestipol (COLESTID) 1 G tablet Take 2 g by mouth 2 (two) times daily.    Yes [provider]  diltiazem (CARDIZEM SR) 120 MG 12 hr capsule Take 120 mg by mouth daily.  08/26/18  Yes [provider]  ezetimibe (ZETIA) 10 MG tablet Take 10 mg by mouth daily.   Yes [provider]  folic acid (FOLVITE) 154 MCG tablet Take 400 mcg by mouth daily.   Yes [provider]  latanoprost (XALATAN) 0.005 % ophthalmic solution Place 1 drop into both eyes at bedtime. 08/11/18  Yes [provider]  levothyroxine (SYNTHROID, LEVOTHROID) 75 MCG tablet Take 75 mcg by  mouth daily before breakfast.   Yes [provider]  omeprazole (PRILOSEC) 40 MG capsule TAKE 1 CAPSULE TWICE A DAY (NEED OFFICE VISIT FOR FURTHER REFILLS) Patient taking differently: Take 40 mg by mouth daily.  11/13/18  Yes Pyrtle, Lajuan Lines, MD  propafenone (RYTHMOL) 225 MG tablet Take 225 mg by mouth 2 (two) times daily.    Yes [provider]  raloxifene (EVISTA) 60 MG tablet Take 60 mg by mouth daily.   Yes [provider]  timolol (BETIMOL) 0.5 % ophthalmic solution Place 1 drop into both eyes daily.   Yes [provider]     Family History  Problem Relation Age of Onset  . Cancer Father        blood cancer, ? type  . Rheum arthritis Father   . Glaucoma Father   . Rheum arthritis Mother   . Arthritis Sister   . Diabetes Sister        TYPE 2  . Heart failure Sister   . Hyperlipidemia Sister   . Colon cancer Neg Hx   . Esophageal cancer Neg Hx   . Pancreatic cancer Neg Hx   . Stomach cancer Neg Hx   . Liver disease Neg Hx     Social History   Socioeconomic History  . Marital status: Single    Spouse name: Not on file  . Number of children: 0  . Years of education: Not on file  . Highest education level: Not on file  Occupational History  . Occupation: RETIRED  Social Needs  . Financial resource strain: Not on file  . Food insecurity    Worry: Not on file    Inability: Not on file  . Transportation needs    Medical: Not on file    Non-medical: Not on file  Tobacco Use  . Smoking status: Never Smoker  . Smokeless tobacco: Never Used  Substance and Sexual Activity  . Alcohol use: Yes    Alcohol/week: 0.0 standard drinks    Comment: rare  . Drug use: No  . Sexual activity: Not on file  Lifestyle  . Physical activity    Days per week: Not on file    Minutes per session: Not on file  . Stress: Not on file  Relationships  . Social Herbalist on phone: Not on file    Gets together: Not on file    Attends religious service: Not on file    Active member of club or organization: Not on file    Attends meetings of clubs or organizations: Not on file    Relationship status: Not on file  Other Topics Concern  . Not on file  Social History Narrative  . Not on file     Review of Systems: A 12 point ROS discussed and pertinent positives are  indicated in the HPI above.  All other systems are negative.  Review of Systems  Constitutional: Negative for fatigue and fever.  Respiratory: Negative for cough and shortness of breath.   Cardiovascular: Negative for chest pain.  Gastrointestinal: Negative for abdominal pain, nausea and vomiting.  Genitourinary: Negative for dysuria and hematuria.  Musculoskeletal: Negative for back pain.  Psychiatric/Behavioral: Negative for behavioral problems and confusion.    Vital Signs: There were no vitals taken for this visit.  Physical Exam Vitals signs and nursing note reviewed.  Constitutional:      General: She is not in acute distress.    Appearance: Normal appearance.  HENT:     Mouth/Throat:     Mouth: Mucous membranes are moist.     Pharynx: Oropharynx is clear.  Cardiovascular:     Rate and Rhythm: Normal rate and regular rhythm.  Pulmonary:     Effort: Pulmonary effort is normal. No respiratory distress.     Breath sounds: Normal breath sounds.  Abdominal:     General: Abdomen is flat.     Palpations: Abdomen is soft.  Skin:    General: Skin is warm and dry.  Neurological:     General: No focal deficit present.     Mental Status: She is alert and oriented to person, place, and time. Mental status is at baseline.  Psychiatric:        Mood and Affect: Mood normal.        Behavior: Behavior normal.        Thought Content: Thought content normal.        Judgment: Judgment normal.      MD Evaluation Airway: WNL Heart: WNL Abdomen: WNL Chest/ Lungs: WNL ASA  Classification: 3 Mallampati/Airway Score: Two   Imaging: Ir Radiologist Eval & Mgmt  Result Date: 12/14/2018 Please refer to notes tab for details about interventional procedure. (Op Note)  Ct Angio Abd/pel W/ And/or W/o  Result Date: 12/07/2018 CLINICAL DATA:  Vascular lesion near the pancreatic tail seen on endoscopic ultrasound EXAM: CTA ABDOMEN AND PELVIS WITH CONTRAST TECHNIQUE: Multidetector CT  imaging of the abdomen and pelvis was performed using the standard protocol during bolus administration of intravenous contrast. Multiplanar reconstructed images and MIPs were obtained and reviewed to evaluate the vascular anatomy. CONTRAST:  162m OMNIPAQUE IOHEXOL 350 MG/ML SOLN COMPARISON:  CT 08/29/2015 FINDINGS: VASCULAR Aorta: Scattered calcified atheromatous plaque. No dissection, aneurysm, or stenosis. Celiac: Patent without evidence of aneurysm, dissection, vasculitis or significant stenosis. Tortuous splenic artery. 4.9 cm pseudoaneurysm related to the distal splenic artery, possibly from a short gastric branch, interposed between the pancreatic tail and splenic hilum. SMA: Widely patent. Replaced right hepatic arterial supply, an anatomic variant. Renals: Single left, widely patent. Single right, with partially calcified ostial plaque resulting in short segment stenosis of possible hemodynamic significance, patent distally. IMA: Patent without evidence of aneurysm, dissection, vasculitis or significant stenosis. Inflow: Tortuous iliac arterial system with scattered calcified atheromatous plaque. No aneurysm, dissection, or stenosis. Proximal Outflow: Bilateral common femoral and visualized portions of the superficial and profunda femoral arteries are patent without evidence of aneurysm, dissection, vasculitis or significant stenosis. Veins: Patent portal vein, splenic vein, SMV, bilateral renal veins. No obvious venous pathology within the limitations of this predominately arterial phase study. Review of the MIP images confirms the above findings. NON-VASCULAR Lower chest: Dependent atelectasis posteriorly in both lung bases. No pleural or pericardial effusion. Hepatobiliary: No focal liver abnormality is seen. Status post cholecystectomy. No biliary dilatation. Pancreas: Unremarkable. No pancreatic ductal dilatation or surrounding inflammatory changes. Spleen: Normal in size. No focal lesion.  Adrenals/Urinary Tract: Normal adrenals. 4.5 cm upper pole right renal cyst. No hydronephrosis. Urinary bladder incompletely distended. Stomach/Bowel: Stomach and small bowel are nondistended. Previous appendectomy. 1 Moderate colonic fecal material without dilatation. Lymphatic: No abdominal or pelvic adenopathy. Reproductive: Status post hysterectomy. No adnexal masses. Other: Bilateral pelvic phleboliths. Trace free pelvic fluid. No free air. Musculoskeletal: Lumbar levoscoliosis apex L3 with multilevel spondylitic change. Sacral Tarlov cysts. No fracture or worrisome bone lesion. IMPRESSION: 1. 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 Visceral aneurysms greater than 2 cm have increased risk of rupture.  Consider IR consultation for possible embolization. 2. Aortoiliac atherosclerosis (ICD10-170.0) without aneurysm or stenosis. Electronically Signed   By: Lucrezia Europe M.D.   On: 12/07/2018 11:47    Labs:  CBC: Recent Labs    10/21/18 1341 12/27/18 0749  WBC 12.4* 7.2  HGB 14.0 13.8  HCT 42.6 41.7  PLT 760* 546*    COAGS: No results for input(s): INR, APTT in the last 8760 hours.  BMP: Recent Labs    10/21/18 1341 12/07/18 1020 12/27/18 0749  NA 131*  --  134*  K 3.2*  --  3.9  CL 96*  --  100  CO2 25  --  25  GLUCOSE 96  --  70  BUN 11  --  13  CALCIUM 8.7*  --  9.1  CREATININE 0.64 0.70 0.71  GFRNONAA >60  --  >60  GFRAA >60  --  >60    LIVER FUNCTION TESTS: Recent Labs    10/21/18 1341  BILITOT 0.9  AST 22  ALT 17  ALKPHOS 66  PROT 7.4  ALBUMIN 4.1    TUMOR MARKERS: No results for input(s): AFPTM, CEA, CA199, CHROMGRNA in the last 8760 hours.  Assessment and Plan: Megan Mercer is an 83 year old female with pertinent past medical history of a fib and HTN who presents after recent incidental finding of splenic artery pseudoaneurysm, new as of 2017.  She recently met with Dr. Pascal Lux in telephone consultation to discuss her result and treatment options.  She  elects to proceed and presents for procedure today.  She has been NPO and is not currently on blood thinners as she appropriately held her Eliquis. She understands the plan is for admission overnight for observation with possible discharge tomorrow morning.    The Risks and benefits of embolization were discussed with the patient including, but not limited to bleeding, infection, vascular injury, post operative pain, or contrast induced renal failure.  This procedure involves the use of X-rays and because of the nature of the planned procedure, it is possible that we will have prolonged use of X-ray fluoroscopy.  Potential radiation risks to you include (but are not limited to) the following: - A slightly elevated risk for cancer several years later in life. This risk is typically less than 0.5% percent. This risk is low in comparison to the normal incidence of human cancer, which is 33% for women and 50% for men according to the [employer]. - Radiation induced injury can include skin redness, resembling a rash, tissue breakdown / ulcers and hair loss (which can be temporary or permanent).  The likelihood of either of these occurring depends on the difficulty of the procedure and whether you are sensitive to radiation due to previous procedures, disease, or genetic conditions.  IF your procedure requires a prolonged use of radiation, you will be notified and given written instructions for further action. It is your responsibility to monitor the irradiated area for the 2 weeks following the procedure and to notify your physician if you are concerned that you have suffered a radiation induced injury.   All of the patient's questions were answered, patient is agreeable to proceed. Consent signed and in chart.  Thank you for this interesting consult.  I greatly enjoyed meeting Megan Mercer and look forward to participating in their care.  A copy of this report was sent to the  requesting provider on this date.  Electronically Signed: Docia Barrier, PA 12/27/2018, 8:33 AM  I spent a total of    25 Minutes in face to face in clinical consultation, greater than 50% of which was counseling/coordinating care for splenic artery pseudoaneurysm.

## 2018-12-28 DIAGNOSIS — D509 Iron deficiency anemia, unspecified: Secondary | ICD-10-CM | POA: Diagnosis present

## 2018-12-28 DIAGNOSIS — S35292A Major laceration of branches of celiac and mesenteric artery, initial encounter: Secondary | ICD-10-CM | POA: Diagnosis not present

## 2018-12-28 DIAGNOSIS — I4891 Unspecified atrial fibrillation: Secondary | ICD-10-CM | POA: Diagnosis not present

## 2018-12-28 DIAGNOSIS — F419 Anxiety disorder, unspecified: Secondary | ICD-10-CM | POA: Diagnosis not present

## 2018-12-28 DIAGNOSIS — K2270 Barrett's esophagus without dysplasia: Secondary | ICD-10-CM | POA: Diagnosis not present

## 2018-12-28 DIAGNOSIS — K449 Diaphragmatic hernia without obstruction or gangrene: Secondary | ICD-10-CM | POA: Diagnosis present

## 2018-12-28 DIAGNOSIS — E871 Hypo-osmolality and hyponatremia: Secondary | ICD-10-CM | POA: Diagnosis not present

## 2018-12-28 DIAGNOSIS — R918 Other nonspecific abnormal finding of lung field: Secondary | ICD-10-CM | POA: Diagnosis present

## 2018-12-28 DIAGNOSIS — I4892 Unspecified atrial flutter: Secondary | ICD-10-CM | POA: Diagnosis not present

## 2018-12-28 DIAGNOSIS — K219 Gastro-esophageal reflux disease without esophagitis: Secondary | ICD-10-CM | POA: Diagnosis not present

## 2018-12-28 DIAGNOSIS — I7101 Dissection of thoracic aorta: Secondary | ICD-10-CM | POA: Diagnosis not present

## 2018-12-28 DIAGNOSIS — Y848 Other medical procedures as the cause of abnormal reaction of the patient, or of later complication, without mention of misadventure at the time of the procedure: Secondary | ICD-10-CM | POA: Diagnosis present

## 2018-12-28 DIAGNOSIS — R42 Dizziness and giddiness: Secondary | ICD-10-CM | POA: Diagnosis not present

## 2018-12-28 DIAGNOSIS — I4819 Other persistent atrial fibrillation: Secondary | ICD-10-CM | POA: Diagnosis not present

## 2018-12-28 DIAGNOSIS — G8929 Other chronic pain: Secondary | ICD-10-CM | POA: Diagnosis present

## 2018-12-28 DIAGNOSIS — Z20828 Contact with and (suspected) exposure to other viral communicable diseases: Secondary | ICD-10-CM | POA: Diagnosis not present

## 2018-12-28 DIAGNOSIS — I959 Hypotension, unspecified: Secondary | ICD-10-CM | POA: Diagnosis not present

## 2018-12-28 DIAGNOSIS — I10 Essential (primary) hypertension: Secondary | ICD-10-CM | POA: Diagnosis not present

## 2018-12-28 DIAGNOSIS — I728 Aneurysm of other specified arteries: Secondary | ICD-10-CM | POA: Diagnosis not present

## 2018-12-28 DIAGNOSIS — F329 Major depressive disorder, single episode, unspecified: Secondary | ICD-10-CM | POA: Diagnosis present

## 2018-12-28 DIAGNOSIS — E039 Hypothyroidism, unspecified: Secondary | ICD-10-CM | POA: Diagnosis present

## 2018-12-28 DIAGNOSIS — T888XXA Other specified complications of surgical and medical care, not elsewhere classified, initial encounter: Secondary | ICD-10-CM | POA: Diagnosis not present

## 2018-12-28 DIAGNOSIS — R946 Abnormal results of thyroid function studies: Secondary | ICD-10-CM | POA: Diagnosis present

## 2018-12-28 DIAGNOSIS — I7123 Aneurysm of the descending thoracic aorta, without rupture: Secondary | ICD-10-CM | POA: Diagnosis present

## 2018-12-28 DIAGNOSIS — E785 Hyperlipidemia, unspecified: Secondary | ICD-10-CM | POA: Diagnosis not present

## 2018-12-28 DIAGNOSIS — Z881 Allergy status to other antibiotic agents status: Secondary | ICD-10-CM | POA: Diagnosis not present

## 2018-12-28 DIAGNOSIS — R000 Tachycardia, unspecified: Secondary | ICD-10-CM | POA: Diagnosis not present

## 2018-12-28 DIAGNOSIS — D473 Essential (hemorrhagic) thrombocythemia: Secondary | ICD-10-CM | POA: Diagnosis present

## 2018-12-28 LAB — CBC
HCT: 43.1 % (ref 36.0–46.0)
Hemoglobin: 14.3 g/dL (ref 12.0–15.0)
MCH: 30.4 pg (ref 26.0–34.0)
MCHC: 33.2 g/dL (ref 30.0–36.0)
MCV: 91.7 fL (ref 80.0–100.0)
Platelets: 558 10*3/uL — ABNORMAL HIGH (ref 150–400)
RBC: 4.7 MIL/uL (ref 3.87–5.11)
RDW: 16 % — ABNORMAL HIGH (ref 11.5–15.5)
WBC: 19.7 10*3/uL — ABNORMAL HIGH (ref 4.0–10.5)
nRBC: 0 % (ref 0.0–0.2)

## 2018-12-28 LAB — BASIC METABOLIC PANEL
Anion gap: 15 (ref 5–15)
BUN: 14 mg/dL (ref 8–23)
CO2: 21 mmol/L — ABNORMAL LOW (ref 22–32)
Calcium: 9 mg/dL (ref 8.9–10.3)
Chloride: 101 mmol/L (ref 98–111)
Creatinine, Ser: 0.88 mg/dL (ref 0.44–1.00)
GFR calc Af Amer: >60 mL/min (ref >60)
GFR calc non Af Amer: >60 mL/min (ref >60)
Glucose, Bld: 115 mg/dL — ABNORMAL HIGH (ref 70–99)
Potassium: 3.8 mmol/L (ref 3.5–5.1)
Sodium: 137 mmol/L (ref 135–145)

## 2018-12-28 LAB — IRON AND TIBC
Iron: 20 ug/dL — ABNORMAL LOW (ref 28–170)
Saturation Ratios: 7 % — ABNORMAL LOW (ref 10.4–31.8)
TIBC: 302 ug/dL (ref 250–450)
UIBC: 282 ug/dL

## 2018-12-28 LAB — FERRITIN: Ferritin: 114 ng/mL (ref 11–307)

## 2018-12-28 MED ORDER — APIXABAN 2.5 MG PO TABS
2.5000 mg | ORAL_TABLET | Freq: Two times a day (BID) | ORAL | Status: DC
  Administered 2018-12-28 – 2019-01-04 (×14): 2.5 mg via ORAL
  Filled 2018-12-28 (×14): qty 1

## 2018-12-28 MED FILL — Sodium Chloride IV Soln 0.9%: INTRAVENOUS | Qty: 5 | Status: AC

## 2018-12-28 MED FILL — Fentanyl Citrate Preservative Free (PF) Inj 1000 MCG/20ML: INTRAMUSCULAR | Qty: 20 | Status: AC

## 2018-12-28 NOTE — Progress Notes (Signed)
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 Denies pain.  Used PCA a few times overnight. Vital signs stable.   Allergies: Ace inhibitors, Amoxicillin, Aspirin, Atorvastatin, Crestor  [rosuvastatin calcium], Iodine, Lipitor  [atorvastatin calcium], Oxycodone, Oxycodone-acetaminophen, Paroxetine hcl, Sulfa antibiotics, Sulfasalazine, and Codeine  Medications: Prior to Admission medications   Medication Sig Start Date End Date Taking? Authorizing Provider  apixaban (ELIQUIS) 2.5 MG TABS tablet Take 2.5 mg by mouth 2 (two) times daily.     [provider]  Calcium Carb-Cholecalciferol (CALCIUM-VITAMIN D) 600-400 MG-UNIT TABS Take 1 tablet by mouth daily.    [provider]  colestipol (COLESTID) 1 G tablet Take 2 g by mouth 2 (two) times daily.     [provider]  diltiazem (CARDIZEM SR) 120 MG 12 hr capsule Take 120 mg by mouth daily.  08/26/18   [provider]  ezetimibe (ZETIA) 10 MG tablet Take 10 mg by mouth daily.    [provider]  folic acid (FOLVITE) 546 MCG tablet Take 400 mcg by mouth daily.    [provider]  latanoprost (XALATAN) 0.005 % ophthalmic solution Place 1 drop into both eyes at bedtime. 08/11/18   [provider]  levothyroxine (SYNTHROID, LEVOTHROID) 75 MCG tablet Take 75 mcg by mouth daily before breakfast.    [provider]  omeprazole (PRILOSEC) 40 MG capsule TAKE 1 CAPSULE TWICE A DAY (NEED OFFICE VISIT FOR FURTHER REFILLS) Patient taking differently: Take 40 mg by mouth daily.  11/13/18   Pyrtle, Lajuan Lines, MD  propafenone (RYTHMOL) 225 MG tablet Take 225 mg by mouth 2 (two) times daily.    [provider]  raloxifene (EVISTA) 60 MG tablet Take 60 mg by mouth daily.    [provider]  timolol  (BETIMOL) 0.5 % ophthalmic solution Place 1 drop into both eyes daily.    [provider]     Vital Signs: BP 124/65    Pulse 93    Temp 99.1 F (37.3 C) (Oral)    Resp 13    Ht _0  (1.626 m)    Wt 123 lb 0.3 oz (55.8 kg)    SpO2 96%    BMI 21.12 kg/m   Physical Exam  NAD, alert but groggy.  Wakes up and participates in conversation Abdomen: soft, non-distended, non-tender. Groin: soft, stable. Non-tender.  No evidence of pseudoaneurysm or hematoma.   Imaging: Ir Aortagram Abdominal Serialogram  Result Date: 12/27/2018 INDICATION: Enlarging splenic artery pseudoaneurysm. Patient presents today for mesenteric arteriogram and attempted percutaneous embolization. Please refer to formal consultation in the epic EMR dated 12/14/2018 for additional details. EXAM: Uterine Fibroid Embolization MEDICATIONS: None ANESTHESIA/SEDATION: Moderate (conscious) sedation was employed during this procedure utilizing intravenous versed and Fentanyl. Moderate Sedation Time: 72 minutes. The patient's level of consciousness and vital signs were monitored continuously by radiology nursing throughout the procedure under my direct supervision. CONTRAST:  14m OMNIPAQUE IOHEXOL 300 MG/ML  SOLN FLUOROSCOPY TIME:  6 minutes, 48 seconds (150.3mGy) COMPLICATIONS: SIR LEVEL C - Requires therapy, minor hospitalization (<48 hrs). Access site complication resulting in a type B thoracic aortic dissection/intramural hematoma. PROCEDURE: Informed consent was obtained from the patient following explanation of the procedure, risks, benefits and alternatives. The patient understands, agrees and consents for the procedure. All questions were addressed. A time out was performed prior to the  initiation of the procedure. Maximal barrier sterile technique utilized including caps, mask, sterile gowns, sterile gloves, large sterile drape, hand hygiene, and Betadine prep. The right femoral head was marked fluoroscopically. Under sterile  conditions and local anesthesia, the right common femoral artery access was performed with a micropuncture needle. Under direct ultrasound guidance, the right common femoral was accessed with a micropuncture kit. An ultrasound image was saved for documentation purposes. Note was made of difficulty advancing the Bentson wire through the pelvic arterial system, presumably secondary to vessel tortuosity and as such, a stiff glidewire was advanced to the level of the abdominal aorta. Given marked tortuosity of the pelvic vasculature and abdominal aortic decision was made to proceed with placement of a 6 French, 35 cm radiopaque tip vascular sheath in hopes of maintaining a more durable accessed regional to the celiac artery origin. As such, the 6 French radiopaque sheath was advanced over the stiff glidewire under fluoroscopic guidance without difficulty. Blood flow was noted to easily aspirate from the side arm sheath. As such, again over a stiff Glidewire a Mickelson catheter was advanced to the level of the thoracic aorta where it was formed however note was made of difficulty aspirating from the catheter. Limited contrast injection demonstrated a subintimal positioning of the Mickelson catheter. At this point, limited contrast injection was performed via the side arm of the vascular sheath also demonstrating subintimal positioning. Given concern for creation of a dissection, the on-call vascular surgeon (Dr. Donnetta Hutching) was made aware. The 35 cm vascular sheath was retracted to the level of the right external iliac artery with contrast injection demonstrating appropriate intraluminal positioning. Attempts were made with a Kumpe catheter to cannulate the true lumen of the right common iliac artery however as a true lumen was not definitively identified, the decision was made to abort the procedure. The 35 cm, 6 French vascular sheath was then exchanged for a standard 7 cm 6 [place] vascular sheath in case additional  emergent percutaneous intervention was required. The vascular sheath was secured at the entrance site within interrupted suture and connected to a pressure bag. A dressing was applied. Note, the patient complained of expected chest and back pain with associated hypertension and tachycardia though the symptoms largely defervesced with administration of conscious sedation and remained otherwise hemodynamically stable. The patient was then escorted to CT to undergo a multiphase CT the chest, abdomen and pelvis. FINDINGS: Given difficulty advancing the initial Bentson access wire to the through the level of the pelvic arterial system initially thought to be secondary to vessel tortuosity, a stiff glidewire was advanced through the pelvic arterial system to the level of the abdominal aorta. Unfortunately, the cephalad wire was in a subintimal location and at the placement of the longer vascular sheath (selected in hopes of obtaining a more durable access IMPRESSION: Unsuccessful mesenteric arteriogram secondary to arterial access complication and development of an aortic dissection/hematoma likely originating at the confluence of the right common and external iliac arteries due to unstable plaque. PLAN: - Subsequent multiphase CT scan of the chest, abdomen and pelvis performed immediately following the above procedure confirmed concern for a type B thoracic aortic dissection/intramural hematoma which results in approximately 50% luminal narrowing of the right artery though the remaining branch vessels of the abdominal aorta remain patent without a hemodynamically significant narrowing. There is minimal opacification of the false lumen of the dissection at the level of the distal aspect of thoracic aorta, though otherwise there is no definitive opacification of the  false lumen and there is no evidence of contrast extravasation. - The patient will be admitted for blood pressure, pain control and definitive vascular surgery  consultation - Once deemed appropriate by the admitting service, the patient will be transitioned back to her outpatient anticoagulation as well as will likely be initiated on platelet inhibition therapy, likely Plavix (patient has history of aspirin allergy). - Ultimately, I will likely repeat a multiphase CT of the chest, abdomen and pelvis in approximately 4-6 weeks to not only evaluate the dissection but also for procedural planning purposes for still untreated splenic artery pseudoaneurysm. Electronically Signed   By: Sandi Mariscal M.D.   On: 12/27/2018 17:37   Ir US Guide Vasc Access Right  Result Date: 12/27/2018 INDICATION: Enlarging splenic artery pseudoaneurysm. Patient presents today for mesenteric arteriogram and attempted percutaneous embolization. Please refer to formal consultation in the epic EMR dated 12/14/2018 for additional details. EXAM: Uterine Fibroid Embolization MEDICATIONS: None ANESTHESIA/SEDATION: Moderate (conscious) sedation was employed during this procedure utilizing intravenous versed and Fentanyl. Moderate Sedation Time: 72 minutes. The patient's level of consciousness and vital signs were monitored continuously by radiology nursing throughout the procedure under my direct supervision. CONTRAST:  24m OMNIPAQUE IOHEXOL 300 MG/ML  SOLN FLUOROSCOPY TIME:  6 minutes, 48 seconds (199.3mGy) COMPLICATIONS: SIR LEVEL C - Requires therapy, minor hospitalization (<48 hrs). Access site complication resulting in a type B thoracic aortic dissection/intramural hematoma. PROCEDURE: Informed consent was obtained from the patient following explanation of the procedure, risks, benefits and alternatives. The patient understands, agrees and consents for the procedure. All questions were addressed. A time out was performed prior to the initiation of the procedure. Maximal barrier sterile technique utilized including caps, mask, sterile gowns, sterile gloves, large sterile drape, hand hygiene, and  Betadine prep. The right femoral head was marked fluoroscopically. Under sterile conditions and local anesthesia, the right common femoral artery access was performed with a micropuncture needle. Under direct ultrasound guidance, the right common femoral was accessed with a micropuncture kit. An ultrasound image was saved for documentation purposes. Note was made of difficulty advancing the Bentson wire through the pelvic arterial system, presumably secondary to vessel tortuosity and as such, a stiff glidewire was advanced to the level of the abdominal aorta. Given marked tortuosity of the pelvic vasculature and abdominal aortic decision was made to proceed with placement of a 6 French, 35 cm radiopaque tip vascular sheath in hopes of maintaining a more durable accessed regional to the celiac artery origin. As such, the 6 French radiopaque sheath was advanced over the stiff glidewire under fluoroscopic guidance without difficulty. Blood flow was noted to easily aspirate from the side arm sheath. As such, again over a stiff Glidewire a Mickelson catheter was advanced to the level of the thoracic aorta where it was formed however note was made of difficulty aspirating from the catheter. Limited contrast injection demonstrated a subintimal positioning of the Mickelson catheter. At this point, limited contrast injection was performed via the side arm of the vascular sheath also demonstrating subintimal positioning. Given concern for creation of a dissection, the on-call vascular surgeon (Dr. EDonnetta Hutching was made aware. The 35 cm vascular sheath was retracted to the level of the right external iliac artery with contrast injection demonstrating appropriate intraluminal positioning. Attempts were made with a Kumpe catheter to cannulate the true lumen of the right common iliac artery however as a true lumen was not definitively identified, the decision was made to abort the procedure. The 35 cm, 6  [place] vascular sheath was then  exchanged for a standard 7 cm 6 [place] vascular sheath in case additional emergent percutaneous intervention was required. The vascular sheath was secured at the entrance site within interrupted suture and connected to a pressure bag. A dressing was applied. Note, the patient complained of expected chest and back pain with associated hypertension and tachycardia though the symptoms largely defervesced with administration of conscious sedation and remained otherwise hemodynamically stable. The patient was then escorted to CT to undergo a multiphase CT the chest, abdomen and pelvis. FINDINGS: Given difficulty advancing the initial Bentson access wire to the through the level of the pelvic arterial system initially thought to be secondary to vessel tortuosity, a stiff glidewire was advanced through the pelvic arterial system to the level of the abdominal aorta. Unfortunately, the cephalad wire was in a subintimal location and at the placement of the longer vascular sheath (selected in hopes of obtaining a more durable access IMPRESSION: Unsuccessful mesenteric arteriogram secondary to arterial access complication and development of an aortic dissection/hematoma likely originating at the confluence of the right common and external iliac arteries due to unstable plaque. PLAN: - Subsequent multiphase CT scan of the chest, abdomen and pelvis performed immediately following the above procedure confirmed concern for a type B thoracic aortic dissection/intramural hematoma which results in approximately 50% luminal narrowing of the right artery though the remaining branch vessels of the abdominal aorta remain patent without a hemodynamically significant narrowing. There is minimal opacification of the false lumen of the dissection at the level of the distal aspect of thoracic aorta, though otherwise there is no definitive opacification of the false lumen and there is no evidence of contrast extravasation. - The patient will be  admitted for blood pressure, pain control and definitive vascular surgery consultation - Once deemed appropriate by the admitting service, the patient will be transitioned back to her outpatient anticoagulation as well as will likely be initiated on platelet inhibition therapy, likely Plavix (patient has history of aspirin allergy). - Ultimately, I will likely repeat a multiphase CT of the chest, abdomen and pelvis in approximately 4-6 weeks to not only evaluate the dissection but also for procedural planning purposes for still untreated splenic artery pseudoaneurysm. Electronically Signed   By: Sandi Mariscal M.D.   On: 12/27/2018 17:37   Ct Angio Chest/abd/pel For Dissection W And/or W/wo  Result Date: 12/27/2018 CLINICAL DATA:  Concern for creation of a false lumen/aortic dissection during arterial access of planned splenic artery embolization. History of pulmonary nodules. EXAM: CT ANGIOGRAPHY CHEST, ABDOMEN AND PELVIS TECHNIQUE: Multidetector CT imaging through the chest, abdomen and pelvis was performed using the standard protocol during bolus administration of intravenous contrast. Multiplanar reconstructed images and MIPs were obtained and reviewed to evaluate the vascular anatomy. CONTRAST:  24m OMNIPAQUE IOHEXOL 350 MG/ML SOLN COMPARISON:  CT abdomen and pelvis-12/07/2018; 08/28/2025 FINDINGS: CTA CHEST FINDINGS Vascular Findings: Review of the precontrast images demonstrates an intramural hematoma beginning at the level of the posterior aspect of the aortic arch, peripheral to the takeoff of the left subclavian artery (sagittal image 83, series 17), extending throughout the descending thoracic aorta and abdominal aorta to the level of the right common and external iliac arteries. High-density contrast material is seen within the false lumen as a sequela of subintimal contrast administration during preceding attempted mesenteric arteriogram. There is a minimal amount of opacification of the intramural  hematoma/false lumen of the dissection at the level of the distal aspect of the descending thoracic  aorta (image 81, series 8), however otherwise there is non opacification of the intramural hematoma. Minimal amount of expected periaortic stranding. No contrast extravasation. Normal caliber the thoracic aorta with measurements as follows. The branch vessels of the aortic arch appear patent throughout their imaged courses. Borderline cardiomegaly.  No pericardial effusion. Although this examination was not tailored for the evaluation the pulmonary arteries, there are no discrete filling defects within the central pulmonary arterial tree to suggest central pulmonary embolism. Normal caliber of the main pulmonary artery. ------------------------------------------------------------- Thoracic aortic measurements: Sinotubular junction 32 mm as measured in greatest oblique short axis coronal dimension. Proximal ascending aorta 32 mm as measured in greatest oblique short axis axial dimension at the level of the main pulmonary artery and 33 mm in greatest oblique short axis coronal diameter (coronal image 38, series 16). Aortic arch aorta 35 mm as measured in greatest oblique short axis sagittal dimension (sagittal image 85, series 17) and approximately 34 mm in greatest oblique short axis coronal diameter (coronal image 61, series 16). Proximal descending thoracic aorta 29 mm as measured in greatest oblique short axis axial dimension at the level of the main pulmonary artery. Distal descending thoracic aorta 27 mm as measured in greatest oblique short axis axial dimension at the level of the diaphragmatic hiatus. Review of the MIP images confirms the above findings. ------------------------------------------------------------- Non-Vascular Findings: Mediastinum/Lymph Nodes: No bulky mediastinal, hilar or axillary lymphadenopathy. Lungs/Pleura: Scattered pulmonary nodules are seen bilaterally some of which demonstrate a  tree-in-bud configuration (representative images 27 and 28, series 5). Dominant right upper lobe pulmonary nodule measures 0.6 cm in diameter (image 28, series 5 while dominant left upper lobe pulmonary nodule measures 0.4 cm (image 26, series 5). Minimal dependent subsegmental atelectasis. No discrete focal airspace opacities. No pleural effusion or pneumothorax. The central pulmonary airways appear widely patent. Musculoskeletal: No acute or aggressive osseous abnormalities. Mild-to-moderate multilevel DDD within midthoracic spine with associated mildly accentuated kyphosis. Regional soft tissues appear normal. Normal appearance of the thyroid gland. _________________________________________________________ _________________________________________________________ CTA ABDOMEN AND PELVIS FINDINGS VASCULAR Aorta: As above, there is extension of the intramural hematoma throughout the abdominal aorta to the level of the right common and external iliac arteries. Review of the precontrast images demonstrates high-density contrast material scattered within the false lumen of the abdominal aortic dissection, the origin and proximal aspect of the right renal artery as well as the right external iliac artery as a sequela of subintimal contrast administration during preceding attempted mesenteric arteriogram. No contrast extravasation or significant periaortic stranding. Scattered atherosclerotic plaque throughout the normal caliber abdominal aorta, not resulting in a hemodynamically significant stenosis. Celiac: Widely patent without a hemodynamically significant narrowing. A replaced right hepatic artery is noted to arise from the proximal SMA. The splenic artery is again noted to be markedly tortuous and supplies a grossly unchanged approximately 4.7 x 3.9 x 4.7 cm pseudoaneurysm arising from a cranial division of the splenic artery, grossly unchanged compared to the 12/07/2018 examination, previously, 4.4 x 3.5 x 4.9 cm.  Redemonstrated moderate to large amount of mural thrombus within the dominant component of the aneurysm. No definitive perianeurysmal stranding. SMA: Widely patent without hemodynamically significant narrowing. A replaced right hepatic artery is noted to arise from the proximal SMA. The distal tributaries of the SMA appear widely patent without discrete intraluminal filling defect to suggest distal embolism. Renals: Solitary bilaterally; review of the precontrast images demonstrates high density contrast material involving the origin and proximal aspects of the right renal artery as a  sequela of subintimal contrast administration during attempted mesenteric arteriogram. The intramural hematoma results in suspected approximately 50% luminal narrowing of the right renal artery though the vessel remains patent without evidence of distal embolism. The left renal artery appears widely patent without hemodynamically significant narrowing. IMA: Disease at its origin though remains patent. Additionally, there is proximal collateral supply from the SMA. Inflow: Review of the precontrast images demonstrates high contrast material involving the right common iliac artery as a sequela of subintimal contrast administration during preceding attempted mesenteric arteriogram. There is a non flow limiting short-segment dissection involving the right common (image 141, series 8) and external iliac arteries (image 146, series 8). There is a moderate amount of eccentric mixed calcified and noncalcified atherosclerotic plaque involving the bilateral common iliac arteries, not resulting in a hemodynamically significant stenosis. The bilateral internal iliac arteries are diseased though patent of normal caliber. The bilateral external iliac arteries are tortuous though widely patent without a hemodynamically significant narrowing. The arterial sheath enters the proximal most aspect of the right superficial femoral artery (image 175, series  8). The bilateral common femoral and imaged portions of the bilateral superficial and deep femoral arteries appear widely patent throughout their imaged courses. Veins: The IVC and pelvic venous system appear widely on this arterial phase examination. Review of the MIP images confirms the above findings. _________________________________________________________ NON-VASCULAR Hepatobiliary: Normal hepatic contour. No discrete hepatic lesions. Post cholecystectomy with persistent dilatation of the CBD and intrahepatic biliary system, presumably the sequela of biliary reservoir phenomena. No ascites. Pancreas: There is persistent dilatation of the central aspect the pancreatic duct, similar to the 08/2015 examination and without definitive pancreatic lesion on this non pancreatic protocol CT scan. No discrete peripancreatic stranding. 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 No perisplenic stranding. Adrenals/Urinary Tract: Precontrast images demonstrate subintimal contrast involving the origin and proximal aspects of the right renal artery with associated right renal nephrogram however fortunately arterial and delayed venous phase imaging demonstrates symmetric enhancement and excretion of the bilateral kidneys with patency of the right renal artery. Note is made of a punctate (approximately 0.2 cm) nonobstructing right-sided renal stone. No evidence of left-sided nephrolithiasis. Note is made of an approximately 4.0 cm hypoattenuating right-sided renal cyst. No evidence urinary obstruction or perinephric stranding. Normal appearance the bilateral adrenal glands. Excreted contrast is seen within otherwise normal-appearing urinary bladder. Stomach/Bowel: Moderate to large colonic stool burden without evidence of enteric obstruction. No pneumoperitoneum, pneumatosis or portal venous gas. Normal appearance of the  terminal ileum. The appendix is not definitively identified however there is no pericecal inflammatory change. Lymphatic: No bulky retroperitoneal, mesenteric, pelvic or inguinal lymphadenopathy. Reproductive: Post hysterectomy. No discrete adnexal lesion. Small amount of presumably reactive free fluid in the pelvic cul-de-sac. Other: Regional soft tissues appear normal. Musculoskeletal: No acute or aggressive osseous abnormalities. Moderate scoliotic curvature of the thoracolumbar spine with moderate to severe multilevel lumbar spine DDD, worse at L2-L3 and L3-L4 with disc space height loss, endplate irregularity and sclerosis. Review of the MIP images confirms the above findings. IMPRESSION: Vascular Impression: 1. Acute intramural hematoma/type B thoracic aortic dissection extending from the takeoff of the left subclavian artery to the level of the right external iliac artery. There is minimal opacification of the intramural hematoma/false lumen at the level of the distal descending thoracic aorta with otherwise non opacification of false lumen. Minimal amount of perivascular stranding at the level of the thoracic aorta  but without evidence of contrast extravasation. 2. Review of the precontrast images demonstrate high-density contrast material within the false lumen of the abdominal aorta, the origin and proximal aspects of the right renal artery as well as the right external iliac artery, as a sequela of subintimal contrast administration during preceding attempted mesenteric arteriogram. 3. Intramural hematoma results in approximately 50% luminal narrowing of the origin the right renal artery. The remaining branch vessels of the abdominal aorta appear widely patent a without hemodynamically significant narrowing. 4. Non flow limiting dissection involving the right common and external iliac arteries. 5. Grossly unchanged size and appearance of the approximately 4.7 cm pseudoaneurysm arising from a superior  division of the distal aspect of the markedly tortuous splenic artery. 6.  Aortic Atherosclerosis (ICD10-I70.0). 7. Borderline cardiomegaly. Nonvascular Impression: 1. Indeterminate bilateral pulmonary nodules, largest of which within the right upper lobe measures 6 mm in diameter. By report, pulmonary nodules have been demonstrated on outside examinations and as such, correlation with prior outside examinations is advised. Otherwise, a non-contrast chest CT at 3-6 months is recommended. If the nodules are stable at time of repeat CT, then future CT at 18-24 months (from today's scan) is considered optional for low-risk patients, but is recommended for high-risk patients. This recommendation follows the consensus statement: Guidelines for Management of Incidental Pulmonary Nodules Detected on CT Images: From the Fleischner Society 2017; Radiology 2017; 284:228-243. Electronically Signed   By: Sandi Mariscal M.D.   On: 12/27/2018 14:46    Labs:  CBC: Recent Labs    10/21/18 1341 12/27/18 0749 12/28/18 0222  WBC 12.4* 7.2 19.7*  HGB 14.0 13.8 14.3  HCT 42.6 41.7 43.1  PLT 760* 546* 558*    COAGS: No results for input(s): INR, APTT in the last 8760 hours.  BMP: Recent Labs    10/21/18 1341 12/07/18 1020 12/27/18 0749 12/28/18 0222  NA 131*  --  134* 137  K 3.2*  --  3.9 3.8  CL 96*  --  100 101  CO2 25  --  25 21*  GLUCOSE 96  --  70 115*  BUN 11  --  13 14  CALCIUM 8.7*  --  9.1 9.0  CREATININE 0.64 0.70 0.71 0.88  GFRNONAA >60  --  >60 >60  GFRAA >60  --  >60 >60    LIVER FUNCTION TESTS: Recent Labs    10/21/18 1341  BILITOT 0.9  AST 22  ALT 17  ALKPHOS 66  PROT 7.4  ALBUMIN 4.1    Assessment and Plan: Splenic artery pseudoaneurysm  Aortic dissection during procedure yesterday. Procedure stopped.  Patient stable this AM.  SCr 0.64 WBC 19.7, likely stress response. Afebrile.  HgB 14.3 BP 124/65  Vascular was consulted kindly evaluated her yesterday.  Recommended  conservative management with belief that this may resolve in time. They have assessed this morning and agree she remains stable today.  Discussed with Dr. Pascal Lux. Initiate Plavix 12m PO daily.  Repeat CT Dissection protocol in 1 month with follow-up with Interventional Radiology. Activity as tolerated.  [place].  No heaving lifting (no lifting greater than 10 lbs). Monitor pain. She does have chronic back pain as well as nausea/vomiting for several years.  Appreciate [facility] assistance in managing.    Probable discharge tomorrow.    Electronically Signed: KDocia Barrier PA 12/28/2018, 11:47 AM   I spent a total of 15 Minutes at the the patient's bedside AND on the patient's hospital floor or  unit, greater than 50% of which was counseling/coordinating care for pseudoaneurysm splenic artery, aortic dissection.

## 2018-12-28 NOTE — Plan of Care (Signed)
Continue to monitor

## 2018-12-28 NOTE — Progress Notes (Signed)
Text page X. Blount N.P. that patient has not been restarted on Eliquis yet. Patient is very concern.

## 2018-12-28 NOTE — Progress Notes (Signed)
Pt complained severe pain 10/10 pain scale on her mid to lower back. She also had nausea and vomiting. Her BP 150/64 mmHg, EKG was irregular HR 80s-100 sinus rhythm/ Atrial fib. CCMD called, reported Pt had unsustained short VT and frequent unifocal PVC. Pt stated she has had A-fib for 6 years and this arrhthymia is not a new onset for her. Pt appeared fatigue, oriented x 4. Neurological intact. Room air SPO2 95-98%.   Phenergan and Zofran given. Hydralazine given 10 mg x 2 doses. Fentanyl 20 mcg loading dose and then continue PCA pump setting per order. Education given about using PCA pump. Pt stated she felt uncomfortable to use narcotics due to nausea and vomiting.  Emesis was appeared as small particles of undigested food and yellowish liquid.   Pt was able to rest and sleep after 20 mcg of Fentanyl loading dose and antiemesis given. Will continue to monitor.  Kennyth Lose, RN

## 2018-12-28 NOTE — Progress Notes (Signed)
Patient ID: Megan Mercer, female   DOB: [birth year], 83 y.o.   MRN: [id number]  Progress Note    12/28/2018 7:24 AM * No surgery found *  Subjective: More comfortable this morning.  Had pain through the night.  She has chronic back pain and I feel that this is mostly related to lying flat on her back.  She reports that this does cause severe back pain chronically.  She also had nausea and vomiting which is typical for her with pain medication.   Vitals:   12/28/18 0515 12/28/18 0516  BP:    Pulse:  91  Resp: 19 19  Temp:    SpO2: 95% 95%   Physical Exam: Abdomen soft and nontender.  2+ femoral pulses bilaterally.  No evidence of groin issues in the right groin  CBC    Component Value Date/Time   WBC 19.7 (H) 12/28/2018 0222   RBC 4.70 12/28/2018 0222   HGB 14.3 12/28/2018 0222   HCT 43.1 12/28/2018 0222   PLT 558 (H) 12/28/2018 0222   MCV 91.7 12/28/2018 0222   MCH 30.4 12/28/2018 0222   MCHC 33.2 12/28/2018 0222   RDW 16.0 (H) 12/28/2018 0222   LYMPHSABS 1.4 10/17/2017 1030   MONOABS 0.9 10/17/2017 1030   EOSABS 0.1 10/17/2017 1030   BASOSABS 0.0 10/17/2017 1030    BMET    Component Value Date/Time   NA 137 12/28/2018 0222   K 3.8 12/28/2018 0222   CL 101 12/28/2018 0222   CO2 21 (L) 12/28/2018 0222   GLUCOSE 115 (H) 12/28/2018 0222   BUN 14 12/28/2018 0222   CREATININE 0.88 12/28/2018 0222   CALCIUM 9.0 12/28/2018 0222   GFRNONAA >60 12/28/2018 0222   GFRAA >60 12/28/2018 0222    INR No results found for: INR   Intake/Output Summary (Last 24 hours) at 12/28/2018 0724 Last data filed at 12/28/2018 0300 Gross per 24 hour  Intake 358.51 ml  Output 200 ml  Net 158.51 ml     Assessment/Plan:  83 y.o. female more stable this morning from a discomfort standpoint.  No evidence of extravasation or bleeding.  H&H stable.  Recommend mobilization today with possible discharge tomorrow if comfortable up and walking     [name], MD [facility] [phone] 12/28/2018 7:24 AM

## 2018-12-28 NOTE — Progress Notes (Signed)
22 ml fentanyl wasted in steri cycle with Jerald Kief, RN

## 2018-12-28 NOTE — Progress Notes (Signed)
P   PROGRESS NOTE    Megan Mercer  XFG:[id number] DOB: Dec 19, 1935 DOA: 12/27/2018 PCP: Clinton Quant, MD   Brief Narrative: 83 year old female with medical history of hypertension, hyperlipidemia, paroxysmal atrial fibrillation on Eliquis, hypothyroidism, Barrett's esophagus, PUD gastroesophageal reflux disease admitted for pseudoaneurysm involving the distal part of the splenic artery.  This was initially thought to be a pancreatic mass was finally determined to be dissection of the ascending thoracic aorta. Vascular surgery evaluated and recommended conservative management with blood pressure control.  Plan is for possible discharge home tomorrow to follow-up with vascular team as outpatient.   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 Continue with blood pressure control with diltiazem, beta-blocker and if continued elevation will add nitroprusside.  2.  Hypothyroidism Continue with levothyroxine  3.  Paroxysmal atrial fibrillation.  Rate controlled. Continue with propafenone and Cardizem Continue with Eliquis  4.  Hyponatremia.  Chronic and stable with sodium at 137 Continue to monitor  5.  Thrombocytosis.  Likely secondary to iron deficiency anemia. Continue with iron supplementation and monitor platelet.    DVT prophylaxis: Eliquis  Code Status: Full  Family Communication: Discussed with patient Disposition Plan: Possible discharge home tomorrow    Consultants:  Vascular surgery  Procedures: None    Antimicrobials: None    Subjective: Patient  was laying in bed quietly and not in acute distress.  She is alert and oriented x3.  Discussed plan for possible discharge home tomorrow.   Objective: Vitals:   12/28/18 1316 12/28/18 1317 12/28/18 1318 12/28/18 1412  BP:    121/61  Pulse: 82 80 85 86  Resp: _0 Temp:    98.8 F (37.1 C)  TempSrc:    Oral  SpO2: 94% 95% 95% 95%  Weight:      Height:        Intake/Output Summary (Last 24 hours) at 12/28/2018 1623 Last data filed at 12/28/2018 1000 Gross per 24 hour  Intake 358.51 ml  Output 700 ml  Net -341.49 ml   Filed Weights   12/27/18 1835  Weight: 55.8 kg    Examination:  General exam: Appears calm and comfortable  Respiratory system: Clear to auscultation. Respiratory effort normal. Cardiovascular system: S1 & S2 heard, RRR. No JVD, murmurs, rubs, gallops or clicks. No pedal edema. Gastrointestinal system: Abdomen is nondistended, soft and nontender. No organomegaly or masses felt. Normal bowel sounds heard. Central nervous system: Alert and oriented. No focal neurological deficits. Extremities: Symmetric 5 x 5 power. Skin: No rashes, lesions or ulcers Psychiatry: Judgement and insight appear normal. Mood & affect appropriate.     Data Reviewed: I have personally reviewed following labs and imaging studies  CBC: Recent Labs  Lab 12/27/18 0749 12/28/18 0222  WBC 7.2 19.7*  HGB 13.8 14.3  HCT 41.7 43.1  MCV 93.5 91.7  PLT 546* 967*   Basic Metabolic Panel: Recent Labs  Lab 12/27/18 0749 12/28/18 0222  NA 134* 137  K 3.9 3.8  CL 100 101  CO2 25 21*  GLUCOSE 70 115*  BUN 13 14  CREATININE 0.71  0.88  CALCIUM 9.1 9.0   GFR: Estimated Creatinine Clearance: 43.3 mL/min (by C-G formula based on SCr of 0.88 mg/dL). Liver Function Tests: No results for input(s): AST, ALT, ALKPHOS, BILITOT, PROT, ALBUMIN in the last 168 hours. No results for input(s): LIPASE, AMYLASE in the last 168 hours. No results for input(s): AMMONIA in the last 168  hours. Coagulation Profile: No results for input(s): INR, PROTIME in the last 168 hours. Cardiac Enzymes: No results for input(s): CKTOTAL, CKMB, CKMBINDEX, TROPONINI in the last 168 hours. BNP (last 3 results) No results for input(s): PROBNP in the last 8760 hours. HbA1C: No results for input(s): HGBA1C in the last 72 hours. CBG: No results for input(s): GLUCAP in the last 168 hours. Lipid Profile: No results for input(s): CHOL, HDL, LDLCALC, TRIG, CHOLHDL, LDLDIRECT in the last 72 hours. Thyroid Function Tests: No results for input(s): TSH, T4TOTAL, FREET4, T3FREE, THYROIDAB in the last 72 hours. Anemia Panel: Recent Labs    12/28/18 1126  FERRITIN 114  TIBC 302  IRON 20*   Sepsis Labs: No results for input(s): PROCALCITON, LATICACIDVEN in the last 168 hours.  No results found for this or any previous visit (from the past 240 hour(s)).       Radiology Studies: Ir Aortagram Abdominal Serialogram  Result Date: 12/27/2018 INDICATION: Enlarging splenic artery pseudoaneurysm. Patient presents today for mesenteric arteriogram and attempted percutaneous embolization. Please refer to formal consultation in the epic EMR dated 12/14/2018 for additional details. EXAM: Uterine Fibroid Embolization MEDICATIONS: None ANESTHESIA/SEDATION: Moderate (conscious) sedation was employed during this procedure utilizing intravenous versed and Fentanyl. Moderate Sedation Time: 72 minutes. The patient's level of consciousness and vital signs were monitored continuously by radiology nursing throughout the procedure under my direct supervision. CONTRAST:  42m OMNIPAQUE IOHEXOL 300 MG/ML  SOLN FLUOROSCOPY TIME:  6 minutes, 48 seconds (138.7mGy) COMPLICATIONS: SIR LEVEL C - Requires therapy, minor hospitalization (<48 hrs). Access site complication resulting in a type B thoracic aortic dissection/intramural hematoma. PROCEDURE: Informed consent was obtained from the patient following explanation of the  procedure, risks, benefits and alternatives. The patient understands, agrees and consents for the procedure. All questions were addressed. A time out was performed prior to the initiation of the procedure. Maximal barrier sterile technique utilized including caps, mask, sterile gowns, sterile gloves, large sterile drape, hand hygiene, and Betadine prep. The right femoral head was marked fluoroscopically. Under sterile conditions and local anesthesia, the right common femoral artery access was performed with a micropuncture needle. Under direct ultrasound guidance, the right common femoral was accessed with a micropuncture kit. An ultrasound image was saved for documentation purposes. Note was made of difficulty advancing the Bentson wire through the pelvic arterial system, presumably secondary to vessel tortuosity and as such, a stiff glidewire was advanced to the level of the abdominal aorta. Given marked tortuosity of the pelvic vasculature and abdominal aortic decision was made to proceed with placement of a 6 French, 35 cm radiopaque tip vascular sheath in hopes of maintaining a more durable accessed regional to the celiac artery origin. As such, the 6 French radiopaque sheath was advanced over the stiff glidewire under fluoroscopic guidance without difficulty. Blood flow was noted to easily aspirate from the side arm sheath. As such, again over a stiff Glidewire a Mickelson catheter was advanced to the level of the thoracic aorta where it was formed however note was made of difficulty aspirating from the catheter. Limited contrast injection demonstrated a subintimal positioning of the Mickelson catheter. At  this point, limited contrast injection was performed via the side arm of the vascular sheath also demonstrating subintimal positioning. Given concern for creation of a dissection, the on-call vascular surgeon (Dr. Donnetta Hutching) was made aware. The 35 cm vascular sheath was retracted to the level of the right  external iliac artery with contrast injection demonstrating appropriate intraluminal positioning. Attempts were made with a Kumpe catheter to cannulate the true lumen of the right common iliac artery however as a true lumen was not definitively identified, the decision was made to abort the procedure. The 35 cm, 6 French vascular sheath was then exchanged for a standard 7 cm 6 [place] vascular sheath in case additional emergent percutaneous intervention was required. The vascular sheath was secured at the entrance site within interrupted suture and connected to a pressure bag. A dressing was applied. Note, the patient complained of expected chest and back pain with associated hypertension and tachycardia though the symptoms largely defervesced with administration of conscious sedation and remained otherwise hemodynamically stable. The patient was then escorted to CT to undergo a multiphase CT the chest, abdomen and pelvis. FINDINGS: Given difficulty advancing the initial Bentson access wire to the through the level of the pelvic arterial system initially thought to be secondary to vessel tortuosity, a stiff glidewire was advanced through the pelvic arterial system to the level of the abdominal aorta. Unfortunately, the cephalad wire was in a subintimal location and at the placement of the longer vascular sheath (selected in hopes of obtaining a more durable access IMPRESSION: Unsuccessful mesenteric arteriogram secondary to arterial access complication and development of an aortic dissection/hematoma likely originating at the confluence of the right common and external iliac arteries due to unstable plaque. PLAN: - Subsequent multiphase CT scan of the chest, abdomen and pelvis performed immediately following the above procedure confirmed concern for a type B thoracic aortic dissection/intramural hematoma which results in approximately 50% luminal narrowing of the right artery though the remaining branch vessels of  the abdominal aorta remain patent without a hemodynamically significant narrowing. There is minimal opacification of the false lumen of the dissection at the level of the distal aspect of thoracic aorta, though otherwise there is no definitive opacification of the false lumen and there is no evidence of contrast extravasation. - The patient will be admitted for blood pressure, pain control and definitive vascular surgery consultation - Once deemed appropriate by the admitting service, the patient will be transitioned back to her outpatient anticoagulation as well as will likely be initiated on platelet inhibition therapy, likely Plavix (patient has history of aspirin allergy). - Ultimately, I will likely repeat a multiphase CT of the chest, abdomen and pelvis in approximately 4-6 weeks to not only evaluate the dissection but also for procedural planning purposes for still untreated splenic artery pseudoaneurysm. Electronically Signed   By: Sandi Mariscal M.D.   On: 12/27/2018 17:37   Ir US Guide Vasc Access Right  Result Date: 12/27/2018 INDICATION: Enlarging splenic artery pseudoaneurysm. Patient presents today for mesenteric arteriogram and attempted percutaneous embolization. Please refer to formal consultation in the epic EMR dated 12/14/2018 for additional details. EXAM: Uterine Fibroid Embolization MEDICATIONS: None ANESTHESIA/SEDATION: Moderate (conscious) sedation was employed during this procedure utilizing intravenous versed and Fentanyl. Moderate Sedation Time: 72 minutes. The patient's level of consciousness and vital signs were monitored continuously by radiology nursing throughout the procedure under my direct supervision. CONTRAST:  36m OMNIPAQUE IOHEXOL 300 MG/ML  SOLN FLUOROSCOPY TIME:  6 minutes, 48 seconds (122.4mGy) COMPLICATIONS: SIR  LEVEL C - Requires therapy, minor hospitalization (<48 hrs). Access site complication resulting in a type B thoracic aortic dissection/intramural hematoma.  PROCEDURE: Informed consent was obtained from the patient following explanation of the procedure, risks, benefits and alternatives. The patient understands, agrees and consents for the procedure. All questions were addressed. A time out was performed prior to the initiation of the procedure. Maximal barrier sterile technique utilized including caps, mask, sterile gowns, sterile gloves, large sterile drape, hand hygiene, and Betadine prep. The right femoral head was marked fluoroscopically. Under sterile conditions and local anesthesia, the right common femoral artery access was performed with a micropuncture needle. Under direct ultrasound guidance, the right common femoral was accessed with a micropuncture kit. An ultrasound image was saved for documentation purposes. Note was made of difficulty advancing the Bentson wire through the pelvic arterial system, presumably secondary to vessel tortuosity and as such, a stiff glidewire was advanced to the level of the abdominal aorta. Given marked tortuosity of the pelvic vasculature and abdominal aortic decision was made to proceed with placement of a 6 French, 35 cm radiopaque tip vascular sheath in hopes of maintaining a more durable accessed regional to the celiac artery origin. As such, the 6 French radiopaque sheath was advanced over the stiff glidewire under fluoroscopic guidance without difficulty. Blood flow was noted to easily aspirate from the side arm sheath. As such, again over a stiff Glidewire a Mickelson catheter was advanced to the level of the thoracic aorta where it was formed however note was made of difficulty aspirating from the catheter. Limited contrast injection demonstrated a subintimal positioning of the Mickelson catheter. At this point, limited contrast injection was performed via the side arm of the vascular sheath also demonstrating subintimal positioning. Given concern for creation of a dissection, the on-call vascular surgeon (Dr. Donnetta Hutching)  was made aware. The 35 cm vascular sheath was retracted to the level of the right external iliac artery with contrast injection demonstrating appropriate intraluminal positioning. Attempts were made with a Kumpe catheter to cannulate the true lumen of the right common iliac artery however as a true lumen was not definitively identified, the decision was made to abort the procedure. The 35 cm, 6 French vascular sheath was then exchanged for a standard 7 cm 6 [place] vascular sheath in case additional emergent percutaneous intervention was required. The vascular sheath was secured at the entrance site within interrupted suture and connected to a pressure bag. A dressing was applied. Note, the patient complained of expected chest and back pain with associated hypertension and tachycardia though the symptoms largely defervesced with administration of conscious sedation and remained otherwise hemodynamically stable. The patient was then escorted to CT to undergo a multiphase CT the chest, abdomen and pelvis. FINDINGS: Given difficulty advancing the initial Bentson access wire to the through the level of the pelvic arterial system initially thought to be secondary to vessel tortuosity, a stiff glidewire was advanced through the pelvic arterial system to the level of the abdominal aorta. Unfortunately, the cephalad wire was in a subintimal location and at the placement of the longer vascular sheath (selected in hopes of obtaining a more durable access IMPRESSION: Unsuccessful mesenteric arteriogram secondary to arterial access complication and development of an aortic dissection/hematoma likely originating at the confluence of the right common and external iliac arteries due to unstable plaque. PLAN: - Subsequent multiphase CT scan of the chest, abdomen and pelvis performed immediately following the above procedure confirmed concern for a type B thoracic aortic  dissection/intramural hematoma which results in approximately  50% luminal narrowing of the right artery though the remaining branch vessels of the abdominal aorta remain patent without a hemodynamically significant narrowing. There is minimal opacification of the false lumen of the dissection at the level of the distal aspect of thoracic aorta, though otherwise there is no definitive opacification of the false lumen and there is no evidence of contrast extravasation. - The patient will be admitted for blood pressure, pain control and definitive vascular surgery consultation - Once deemed appropriate by the admitting service, the patient will be transitioned back to her outpatient anticoagulation as well as will likely be initiated on platelet inhibition therapy, likely Plavix (patient has history of aspirin allergy). - Ultimately, I will likely repeat a multiphase CT of the chest, abdomen and pelvis in approximately 4-6 weeks to not only evaluate the dissection but also for procedural planning purposes for still untreated splenic artery pseudoaneurysm. Electronically Signed   By: Sandi Mariscal M.D.   On: 12/27/2018 17:37   Ct Angio Chest/abd/pel For Dissection W And/or W/wo  Result Date: 12/27/2018 CLINICAL DATA:  Concern for creation of a false lumen/aortic dissection during arterial access of planned splenic artery embolization. History of pulmonary nodules. EXAM: CT ANGIOGRAPHY CHEST, ABDOMEN AND PELVIS TECHNIQUE: Multidetector CT imaging through the chest, abdomen and pelvis was performed using the standard protocol during bolus administration of intravenous contrast. Multiplanar reconstructed images and MIPs were obtained and reviewed to evaluate the vascular anatomy. CONTRAST:  41m OMNIPAQUE IOHEXOL 350 MG/ML SOLN COMPARISON:  CT abdomen and pelvis-12/07/2018; 08/28/2025 FINDINGS: CTA CHEST FINDINGS Vascular Findings: Review of the precontrast images demonstrates an intramural hematoma beginning at the level of the posterior aspect of the aortic arch, peripheral to  the takeoff of the left subclavian artery (sagittal image 83, series 17), extending throughout the descending thoracic aorta and abdominal aorta to the level of the right common and external iliac arteries. High-density contrast material is seen within the false lumen as a sequela of subintimal contrast administration during preceding attempted mesenteric arteriogram. There is a minimal amount of opacification of the intramural hematoma/false lumen of the dissection at the level of the distal aspect of the descending thoracic aorta (image 81, series 8), however otherwise there is non opacification of the intramural hematoma. Minimal amount of expected periaortic stranding. No contrast extravasation. Normal caliber the thoracic aorta with measurements as follows. The branch vessels of the aortic arch appear patent throughout their imaged courses. Borderline cardiomegaly.  No pericardial effusion. Although this examination was not tailored for the evaluation the pulmonary arteries, there are no discrete filling defects within the central pulmonary arterial tree to suggest central pulmonary embolism. Normal caliber of the main pulmonary artery. ------------------------------------------------------------- Thoracic aortic measurements: Sinotubular junction 32 mm as measured in greatest oblique short axis coronal dimension. Proximal ascending aorta 32 mm as measured in greatest oblique short axis axial dimension at the level of the main pulmonary artery and 33 mm in greatest oblique short axis coronal diameter (coronal image 38, series 16). Aortic arch aorta 35 mm as measured in greatest oblique short axis sagittal dimension (sagittal image 85, series 17) and approximately 34 mm in greatest oblique short axis coronal diameter (coronal image 61, series 16). Proximal descending thoracic aorta 29 mm as measured in greatest oblique short axis axial dimension at the level of the main pulmonary artery. Distal descending  thoracic aorta 27 mm as measured in greatest oblique short axis axial dimension at the level of the diaphragmatic  hiatus. Review of the MIP images confirms the above findings. ------------------------------------------------------------- Non-Vascular Findings: Mediastinum/Lymph Nodes: No bulky mediastinal, hilar or axillary lymphadenopathy. Lungs/Pleura: Scattered pulmonary nodules are seen bilaterally some of which demonstrate a tree-in-bud configuration (representative images 27 and 28, series 5). Dominant right upper lobe pulmonary nodule measures 0.6 cm in diameter (image 28, series 5 while dominant left upper lobe pulmonary nodule measures 0.4 cm (image 26, series 5). Minimal dependent subsegmental atelectasis. No discrete focal airspace opacities. No pleural effusion or pneumothorax. The central pulmonary airways appear widely patent. Musculoskeletal: No acute or aggressive osseous abnormalities. Mild-to-moderate multilevel DDD within midthoracic spine with associated mildly accentuated kyphosis. Regional soft tissues appear normal. Normal appearance of the thyroid gland. _________________________________________________________ _________________________________________________________ CTA ABDOMEN AND PELVIS FINDINGS VASCULAR Aorta: As above, there is extension of the intramural hematoma throughout the abdominal aorta to the level of the right common and external iliac arteries. Review of the precontrast images demonstrates high-density contrast material scattered within the false lumen of the abdominal aortic dissection, the origin and proximal aspect of the right renal artery as well as the right external iliac artery as a sequela of subintimal contrast administration during preceding attempted mesenteric arteriogram. No contrast extravasation or significant periaortic stranding. Scattered atherosclerotic plaque throughout the normal caliber abdominal aorta, not resulting in a hemodynamically significant  stenosis. Celiac: Widely patent without a hemodynamically significant narrowing. A replaced right hepatic artery is noted to arise from the proximal SMA. The splenic artery is again noted to be markedly tortuous and supplies a grossly unchanged approximately 4.7 x 3.9 x 4.7 cm pseudoaneurysm arising from a cranial division of the splenic artery, grossly unchanged compared to the 12/07/2018 examination, previously, 4.4 x 3.5 x 4.9 cm. Redemonstrated moderate to large amount of mural thrombus within the dominant component of the aneurysm. No definitive perianeurysmal stranding. SMA: Widely patent without hemodynamically significant narrowing. A replaced right hepatic artery is noted to arise from the proximal SMA. The distal tributaries of the SMA appear widely patent without discrete intraluminal filling defect to suggest distal embolism. Renals: Solitary bilaterally; review of the precontrast images demonstrates high density contrast material involving the origin and proximal aspects of the right renal artery as a sequela of subintimal contrast administration during attempted mesenteric arteriogram. The intramural hematoma results in suspected approximately 50% luminal narrowing of the right renal artery though the vessel remains patent without evidence of distal embolism. The left renal artery appears widely patent without hemodynamically significant narrowing. IMA: Disease at its origin though remains patent. Additionally, there is proximal collateral supply from the SMA. Inflow: Review of the precontrast images demonstrates high contrast material involving the right common iliac artery as a sequela of subintimal contrast administration during preceding attempted mesenteric arteriogram. There is a non flow limiting short-segment dissection involving the right common (image 141, series 8) and external iliac arteries (image 146, series 8). There is a moderate amount of eccentric mixed calcified and noncalcified  atherosclerotic plaque involving the bilateral common iliac arteries, not resulting in a hemodynamically significant stenosis. The bilateral internal iliac arteries are diseased though patent of normal caliber. The bilateral external iliac arteries are tortuous though widely patent without a hemodynamically significant narrowing. The arterial sheath enters the proximal most aspect of the right superficial femoral artery (image 175, series 8). The bilateral common femoral and imaged portions of the bilateral superficial and deep femoral arteries appear widely patent throughout their imaged courses. Veins: The IVC and pelvic venous system appear widely on this arterial phase examination.  Review of the MIP images confirms the above findings. _________________________________________________________ NON-VASCULAR Hepatobiliary: Normal hepatic contour. No discrete hepatic lesions. Post cholecystectomy with persistent dilatation of the CBD and intrahepatic biliary system, presumably the sequela of biliary reservoir phenomena. No ascites. Pancreas: There is persistent dilatation of the central aspect the pancreatic duct, similar to the 08/2015 examination and without definitive pancreatic lesion on this non pancreatic protocol CT scan. No discrete peripancreatic stranding. 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 No perisplenic stranding. Adrenals/Urinary Tract: Precontrast images demonstrate subintimal contrast involving the origin and proximal aspects of the right renal artery with associated right renal nephrogram however fortunately arterial and delayed venous phase imaging demonstrates symmetric enhancement and excretion of the bilateral kidneys with patency of the right renal artery. Note is made of a punctate (approximately 0.2 cm) nonobstructing right-sided renal stone. No evidence of left-sided nephrolithiasis.  Note is made of an approximately 4.0 cm hypoattenuating right-sided renal cyst. No evidence urinary obstruction or perinephric stranding. Normal appearance the bilateral adrenal glands. Excreted contrast is seen within otherwise normal-appearing urinary bladder. Stomach/Bowel: Moderate to large colonic stool burden without evidence of enteric obstruction. No pneumoperitoneum, pneumatosis or portal venous gas. Normal appearance of the terminal ileum. The appendix is not definitively identified however there is no pericecal inflammatory change. Lymphatic: No bulky retroperitoneal, mesenteric, pelvic or inguinal lymphadenopathy. Reproductive: Post hysterectomy. No discrete adnexal lesion. Small amount of presumably reactive free fluid in the pelvic cul-de-sac. Other: Regional soft tissues appear normal. Musculoskeletal: No acute or aggressive osseous abnormalities. Moderate scoliotic curvature of the thoracolumbar spine with moderate to severe multilevel lumbar spine DDD, worse at L2-L3 and L3-L4 with disc space height loss, endplate irregularity and sclerosis. Review of the MIP images confirms the above findings. IMPRESSION: Vascular Impression: 1. Acute intramural hematoma/type B thoracic aortic dissection extending from the takeoff of the left subclavian artery to the level of the right external iliac artery. There is minimal opacification of the intramural hematoma/false lumen at the level of the distal descending thoracic aorta with otherwise non opacification of false lumen. Minimal amount of perivascular stranding at the level of the thoracic aorta but without evidence of contrast extravasation. 2. Review of the precontrast images demonstrate high-density contrast material within the false lumen of the abdominal aorta, the origin and proximal aspects of the right renal artery as well as the right external iliac artery, as a sequela of subintimal contrast administration during preceding attempted mesenteric  arteriogram. 3. Intramural hematoma results in approximately 50% luminal narrowing of the origin the right renal artery. The remaining branch vessels of the abdominal aorta appear widely patent a without hemodynamically significant narrowing. 4. Non flow limiting dissection involving the right common and external iliac arteries. 5. 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 6.  Aortic Atherosclerosis (ICD10-I70.0). 7. Borderline cardiomegaly. Nonvascular Impression: 1. Indeterminate bilateral pulmonary nodules, largest of which within the right upper lobe measures 6 mm in diameter. By report, pulmonary nodules have been demonstrated on outside examinations and as such, correlation with prior outside examinations is advised. Otherwise, a non-contrast chest CT at 3-6 months is recommended. If the nodules are stable at time of repeat CT, then future CT at 18-24 months (from today's scan) is considered optional for low-risk patients, but is recommended for high-risk patients. This recommendation follows the consensus statement:  Guidelines for Management of Incidental Pulmonary Nodules Detected on CT Images: From the Fleischner Society 2017; Radiology 2017; 284:228-243. Electronically Signed   By: Sandi Mariscal M.D.   On: 12/27/2018 14:46        Scheduled Meds:  colestipol  2 g Oral BID   diltiazem  120 mg Oral Daily   ezetimibe  10 mg Oral Daily   latanoprost  1 drop Both Eyes QHS   levothyroxine  75 mcg Oral QAC breakfast   pantoprazole  40 mg Oral Daily   propafenone  225 mg Oral BID   raloxifene  60 mg Oral Daily   sodium chloride flush  3 mL Intravenous Q12H   sodium chloride flush  3 mL Intravenous Q12H   timolol  1 drop Both Eyes Daily   Continuous Infusions:   LOS: 0 days    Time spent: 35 minutes    Elie Confer, MD [facility] Pager [phone] `  If 7PM-7AM, please contact night-coverage [email or web address] Password [facility] 12/28/2018, 4:23 PM

## 2018-12-29 ENCOUNTER — Encounter (HOSPITAL_COMMUNITY): Payer: Self-pay

## 2018-12-29 DIAGNOSIS — I7101 Dissection of thoracic aorta: Secondary | ICD-10-CM | POA: Diagnosis not present

## 2018-12-29 MED ORDER — METOPROLOL TARTRATE 5 MG/5ML IV SOLN
5.0000 mg | INTRAVENOUS | Status: DC | PRN
  Administered 2018-12-29 – 2019-01-03 (×8): 5 mg via INTRAVENOUS
  Filled 2018-12-29 (×8): qty 5

## 2018-12-29 MED ORDER — METOPROLOL TARTRATE 5 MG/5ML IV SOLN
5.0000 mg | Freq: Once | INTRAVENOUS | Status: AC
  Administered 2018-12-29: 09:00:00 5 mg via INTRAVENOUS

## 2018-12-29 MED ORDER — METOPROLOL TARTRATE 5 MG/5ML IV SOLN
INTRAVENOUS | Status: AC
  Filled 2018-12-29: qty 5

## 2018-12-29 MED ORDER — PROMETHAZINE HCL 25 MG/ML IJ SOLN
12.5000 mg | Freq: Once | INTRAMUSCULAR | Status: AC
  Administered 2018-12-29: 12.5 mg via INTRAMUSCULAR
  Filled 2018-12-29: qty 1

## 2018-12-29 MED ORDER — METOPROLOL TARTRATE 25 MG PO TABS
25.0000 mg | ORAL_TABLET | Freq: Two times a day (BID) | ORAL | Status: DC
  Administered 2018-12-29 – 2018-12-31 (×5): 25 mg via ORAL
  Filled 2018-12-29 (×5): qty 1

## 2018-12-29 MED ORDER — DILTIAZEM HCL 60 MG PO TABS
30.0000 mg | ORAL_TABLET | Freq: Four times a day (QID) | ORAL | Status: AC
  Administered 2018-12-29 – 2018-12-30 (×2): 30 mg via ORAL
  Filled 2018-12-29 (×3): qty 1

## 2018-12-29 MED ORDER — METOPROLOL TARTRATE 5 MG/5ML IV SOLN
5.0000 mg | Freq: Once | INTRAVENOUS | Status: AC
  Administered 2018-12-29: 08:00:00 5 mg via INTRAVENOUS

## 2018-12-29 MED ORDER — LORAZEPAM 2 MG/ML IJ SOLN
1.0000 mg | Freq: Once | INTRAMUSCULAR | Status: AC
  Administered 2018-12-29: 11:00:00 1 mg via INTRAVENOUS
  Filled 2018-12-29: qty 1

## 2018-12-29 MED ORDER — CLOPIDOGREL BISULFATE 75 MG PO TABS
75.0000 mg | ORAL_TABLET | Freq: Every day | ORAL | Status: DC
  Administered 2018-12-29 – 2019-01-04 (×7): 75 mg via ORAL
  Filled 2018-12-29 (×7): qty 1

## 2018-12-29 NOTE — Progress Notes (Signed)
5 mg Metoprolol IV given at 0749 per order. Patient still in Afib RVR sustaining in 120-150s. Patient asymptomatic, no chest pain, no SOB. Up eating breakfast in bed.   PO Metoprolol given at 0807.   0836: Pt remains in Afib RVR. Will notify MD.

## 2018-12-29 NOTE — Progress Notes (Signed)
Spoke to Dr. Junious Dresser. No further orders at this time. RN to follow up with doctor at 35.   Arletta Bale, RN

## 2018-12-29 NOTE — Progress Notes (Signed)
Spoke with Brynda Greathouse PA to clarify order of Plavix. Pt to receive both Plavix and Eliquis per order.   Arletta Bale, RN

## 2018-12-29 NOTE — Progress Notes (Signed)
Pt having Afib RVR on monitor sustaining in 150's. [facility] notified. Advises order for 5mg  Metoprolol IV. Will continue to monitor pt. Jerald Kief, RN

## 2018-12-29 NOTE — Progress Notes (Signed)
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  Was also feeling anxious and nauseated so she received ativan and phenegran.   Very sleepy during visit today but does wake up and answer my questions.  Denies pain.  Still tachycardic but reports she is not symptomatic of this time--- did report anxiety earlier.   Allergies: Ace inhibitors, Amoxicillin, Aspirin, Atorvastatin, Crestor  [rosuvastatin calcium], Iodine, Lipitor  [atorvastatin calcium], Oxycodone, Oxycodone-acetaminophen, Paroxetine hcl, Sulfa antibiotics, Sulfasalazine, and Codeine  Medications: Prior to Admission medications   Medication Sig Start Date End Date Taking? Authorizing Provider  apixaban (ELIQUIS) 2.5 MG TABS tablet Take 2.5 mg by mouth 2 (two) times daily.     [provider]  Calcium Carb-Cholecalciferol (CALCIUM-VITAMIN D) 600-400 MG-UNIT TABS Take 1 tablet by mouth daily.    [provider]  colestipol (COLESTID) 1 G tablet Take 2 g by mouth 2 (two) times daily.     [provider]  diltiazem (CARDIZEM SR) 120 MG 12 hr capsule Take 120 mg by mouth daily.  08/26/18   [provider]  ezetimibe (ZETIA) 10 MG tablet Take 10 mg by mouth daily.    [provider]  folic acid (FOLVITE) 244 MCG tablet Take 400 mcg by mouth daily.    [provider]  latanoprost (XALATAN) 0.005 % ophthalmic solution Place 1 drop into both eyes at bedtime. 08/11/18   [provider]  levothyroxine (SYNTHROID, LEVOTHROID) 75 MCG tablet Take 75 mcg by mouth daily before breakfast.    [provider]  omeprazole (PRILOSEC) 40 MG capsule TAKE 1 CAPSULE TWICE A DAY (NEED OFFICE VISIT FOR FURTHER REFILLS) Patient taking differently: Take 40 mg by mouth daily.  11/13/18   Pyrtle, Lajuan Lines, MD  propafenone (RYTHMOL)  225 MG tablet Take 225 mg by mouth 2 (two) times daily.    [provider]  raloxifene (EVISTA) 60 MG tablet Take 60 mg by mouth daily.    [provider]  timolol (BETIMOL) 0.5 % ophthalmic solution Place 1 drop into both eyes daily.    [provider]     Vital Signs: BP 128/82    Pulse (!) 121    Temp 99.6 F (37.6 C) (Oral)    Resp 17    Ht '5\' 4"'  (1.626 m)    Wt 123 lb 0.3 oz (55.8 kg)    SpO2 96%    BMI 21.12 kg/m   Physical Exam  NAD, alert but groggy.  Wakes up and participates in conversation Heart: irregular irregular Abdomen: soft, non-distended, non-tender. Groin: soft, stable. Non-tender.  No evidence of pseudoaneurysm or hematoma.   Imaging: Ir US Guide Vasc Access Right  Result Date: 12/27/2018 INDICATION: Enlarging splenic artery pseudoaneurysm. Patient presents today for mesenteric arteriogram and attempted percutaneous embolization. Please refer to formal consultation in the epic EMR dated 12/14/2018 for additional details. EXAM: Uterine Fibroid Embolization MEDICATIONS: None ANESTHESIA/SEDATION: Moderate (conscious) sedation was employed during this procedure utilizing intravenous versed and Fentanyl. Moderate Sedation Time: 72 minutes. The patient's level of consciousness and vital signs were monitored continuously by radiology nursing throughout the procedure under my direct supervision. CONTRAST:  54m OMNIPAQUE IOHEXOL 300 MG/ML  SOLN FLUOROSCOPY TIME:  6 minutes, 48 seconds (162.8mGy) COMPLICATIONS: SIR LEVEL C - Requires therapy, minor hospitalization (<48 hrs). Access site complication resulting in a type B thoracic  aortic dissection/intramural hematoma. PROCEDURE: Informed consent was obtained from the patient following explanation of the procedure, risks, benefits and alternatives. The patient understands, agrees and consents for the procedure. All questions were addressed. A time out was performed prior to the initiation of the procedure.  Maximal barrier sterile technique utilized including caps, mask, sterile gowns, sterile gloves, large sterile drape, hand hygiene, and Betadine prep. The right femoral head was marked fluoroscopically. Under sterile conditions and local anesthesia, the right common femoral artery access was performed with a micropuncture needle. Under direct ultrasound guidance, the right common femoral was accessed with a micropuncture kit. An ultrasound image was saved for documentation purposes. Note was made of difficulty advancing the Bentson wire through the pelvic arterial system, presumably secondary to vessel tortuosity and as such, a stiff glidewire was advanced to the level of the abdominal aorta. Given marked tortuosity of the pelvic vasculature and abdominal aortic decision was made to proceed with placement of a 6 French, 35 cm radiopaque tip vascular sheath in hopes of maintaining a more durable accessed regional to the celiac artery origin. As such, the 6 French radiopaque sheath was advanced over the stiff glidewire under fluoroscopic guidance without difficulty. Blood flow was noted to easily aspirate from the side arm sheath. As such, again over a stiff Glidewire a Mickelson catheter was advanced to the level of the thoracic aorta where it was formed however note was made of difficulty aspirating from the catheter. Limited contrast injection demonstrated a subintimal positioning of the Mickelson catheter. At this point, limited contrast injection was performed via the side arm of the vascular sheath also demonstrating subintimal positioning. Given concern for creation of a dissection, the on-call vascular surgeon (Dr. Donnetta Hutching) was made aware. The 35 cm vascular sheath was retracted to the level of the right external iliac artery with contrast injection demonstrating appropriate intraluminal positioning. Attempts were made with a Kumpe catheter to cannulate the true lumen of the right common iliac artery however as a  true lumen was not definitively identified, the decision was made to abort the procedure. The 35 cm, 6 French vascular sheath was then exchanged for a standard 7 cm 6 [place] vascular sheath in case additional emergent percutaneous intervention was required. The vascular sheath was secured at the entrance site within interrupted suture and connected to a pressure bag. A dressing was applied. Note, the patient complained of expected chest and back pain with associated hypertension and tachycardia though the symptoms largely defervesced with administration of conscious sedation and remained otherwise hemodynamically stable. The patient was then escorted to CT to undergo a multiphase CT the chest, abdomen and pelvis. FINDINGS: Given difficulty advancing the initial Bentson access wire to the through the level of the pelvic arterial system initially thought to be secondary to vessel tortuosity, a stiff glidewire was advanced through the pelvic arterial system to the level of the abdominal aorta. Unfortunately, the cephalad wire was in a subintimal location and at the placement of the longer vascular sheath (selected in hopes of obtaining a more durable access IMPRESSION: Unsuccessful mesenteric arteriogram secondary to arterial access complication and development of an aortic dissection/hematoma likely originating at the confluence of the right common and external iliac arteries due to unstable plaque. PLAN: - Subsequent multiphase CT scan of the chest, abdomen and pelvis performed immediately following the above procedure confirmed concern for a type B thoracic aortic dissection/intramural hematoma which results in approximately 50% luminal narrowing of the right artery though the remaining branch vessels  of the abdominal aorta remain patent without a hemodynamically significant narrowing. There is minimal opacification of the false lumen of the dissection at the level of the distal aspect of thoracic aorta, though  otherwise there is no definitive opacification of the false lumen and there is no evidence of contrast extravasation. - The patient will be admitted for blood pressure, pain control and definitive vascular surgery consultation - Once deemed appropriate by the admitting service, the patient will be transitioned back to her outpatient anticoagulation as well as will likely be initiated on platelet inhibition therapy, likely Plavix (patient has history of aspirin allergy). - Ultimately, I will likely repeat a multiphase CT of the chest, abdomen and pelvis in approximately 4-6 weeks to not only evaluate the dissection but also for procedural planning purposes for still untreated splenic artery pseudoaneurysm. Electronically Signed   By: Sandi Mariscal M.D.   On: 12/27/2018 17:37   Ct Angio Chest/abd/pel For Dissection W And/or W/wo  Result Date: 12/27/2018 CLINICAL DATA:  Concern for creation of a false lumen/aortic dissection during arterial access of planned splenic artery embolization. History of pulmonary nodules. EXAM: CT ANGIOGRAPHY CHEST, ABDOMEN AND PELVIS TECHNIQUE: Multidetector CT imaging through the chest, abdomen and pelvis was performed using the standard protocol during bolus administration of intravenous contrast. Multiplanar reconstructed images and MIPs were obtained and reviewed to evaluate the vascular anatomy. CONTRAST:  4m OMNIPAQUE IOHEXOL 350 MG/ML SOLN COMPARISON:  CT abdomen and pelvis-12/07/2018; 08/28/2025 FINDINGS: CTA CHEST FINDINGS Vascular Findings: Review of the precontrast images demonstrates an intramural hematoma beginning at the level of the posterior aspect of the aortic arch, peripheral to the takeoff of the left subclavian artery (sagittal image 83, series 17), extending throughout the descending thoracic aorta and abdominal aorta to the level of the right common and external iliac arteries. High-density contrast material is seen within the false lumen as a sequela of  subintimal contrast administration during preceding attempted mesenteric arteriogram. There is a minimal amount of opacification of the intramural hematoma/false lumen of the dissection at the level of the distal aspect of the descending thoracic aorta (image 81, series 8), however otherwise there is non opacification of the intramural hematoma. Minimal amount of expected periaortic stranding. No contrast extravasation. Normal caliber the thoracic aorta with measurements as follows. The branch vessels of the aortic arch appear patent throughout their imaged courses. Borderline cardiomegaly.  No pericardial effusion. Although this examination was not tailored for the evaluation the pulmonary arteries, there are no discrete filling defects within the central pulmonary arterial tree to suggest central pulmonary embolism. Normal caliber of the main pulmonary artery. ------------------------------------------------------------- Thoracic aortic measurements: Sinotubular junction 32 mm as measured in greatest oblique short axis coronal dimension. Proximal ascending aorta 32 mm as measured in greatest oblique short axis axial dimension at the level of the main pulmonary artery and 33 mm in greatest oblique short axis coronal diameter (coronal image 38, series 16). Aortic arch aorta 35 mm as measured in greatest oblique short axis sagittal dimension (sagittal image 85, series 17) and approximately 34 mm in greatest oblique short axis coronal diameter (coronal image 61, series 16). Proximal descending thoracic aorta 29 mm as measured in greatest oblique short axis axial dimension at the level of the main pulmonary artery. Distal descending thoracic aorta 27 mm as measured in greatest oblique short axis axial dimension at the level of the diaphragmatic hiatus. Review of the MIP images confirms the above findings. ------------------------------------------------------------- Non-Vascular Findings: Mediastinum/Lymph Nodes: No  bulky mediastinal,  hilar or axillary lymphadenopathy. Lungs/Pleura: Scattered pulmonary nodules are seen bilaterally some of which demonstrate a tree-in-bud configuration (representative images 27 and 28, series 5). Dominant right upper lobe pulmonary nodule measures 0.6 cm in diameter (image 28, series 5 while dominant left upper lobe pulmonary nodule measures 0.4 cm (image 26, series 5). Minimal dependent subsegmental atelectasis. No discrete focal airspace opacities. No pleural effusion or pneumothorax. The central pulmonary airways appear widely patent. Musculoskeletal: No acute or aggressive osseous abnormalities. Mild-to-moderate multilevel DDD within midthoracic spine with associated mildly accentuated kyphosis. Regional soft tissues appear normal. Normal appearance of the thyroid gland. _________________________________________________________ _________________________________________________________ CTA ABDOMEN AND PELVIS FINDINGS VASCULAR Aorta: As above, there is extension of the intramural hematoma throughout the abdominal aorta to the level of the right common and external iliac arteries. Review of the precontrast images demonstrates high-density contrast material scattered within the false lumen of the abdominal aortic dissection, the origin and proximal aspect of the right renal artery as well as the right external iliac artery as a sequela of subintimal contrast administration during preceding attempted mesenteric arteriogram. No contrast extravasation or significant periaortic stranding. Scattered atherosclerotic plaque throughout the normal caliber abdominal aorta, not resulting in a hemodynamically significant stenosis. Celiac: Widely patent without a hemodynamically significant narrowing. A replaced right hepatic artery is noted to arise from the proximal SMA. The splenic artery is again noted to be markedly tortuous and supplies a grossly unchanged approximately 4.7 x 3.9 x 4.7 cm pseudoaneurysm  arising from a cranial division of the splenic artery, grossly unchanged compared to the 12/07/2018 examination, previously, 4.4 x 3.5 x 4.9 cm. Redemonstrated moderate to large amount of mural thrombus within the dominant component of the aneurysm. No definitive perianeurysmal stranding. SMA: Widely patent without hemodynamically significant narrowing. A replaced right hepatic artery is noted to arise from the proximal SMA. The distal tributaries of the SMA appear widely patent without discrete intraluminal filling defect to suggest distal embolism. Renals: Solitary bilaterally; review of the precontrast images demonstrates high density contrast material involving the origin and proximal aspects of the right renal artery as a sequela of subintimal contrast administration during attempted mesenteric arteriogram. The intramural hematoma results in suspected approximately 50% luminal narrowing of the right renal artery though the vessel remains patent without evidence of distal embolism. The left renal artery appears widely patent without hemodynamically significant narrowing. IMA: Disease at its origin though remains patent. Additionally, there is proximal collateral supply from the SMA. Inflow: Review of the precontrast images demonstrates high contrast material involving the right common iliac artery as a sequela of subintimal contrast administration during preceding attempted mesenteric arteriogram. There is a non flow limiting short-segment dissection involving the right common (image 141, series 8) and external iliac arteries (image 146, series 8). There is a moderate amount of eccentric mixed calcified and noncalcified atherosclerotic plaque involving the bilateral common iliac arteries, not resulting in a hemodynamically significant stenosis. The bilateral internal iliac arteries are diseased though patent of normal caliber. The bilateral external iliac arteries are tortuous though widely patent without a  hemodynamically significant narrowing. The arterial sheath enters the proximal most aspect of the right superficial femoral artery (image 175, series 8). The bilateral common femoral and imaged portions of the bilateral superficial and deep femoral arteries appear widely patent throughout their imaged courses. Veins: The IVC and pelvic venous system appear widely on this arterial phase examination. Review of the MIP images confirms the above findings. _________________________________________________________ NON-VASCULAR Hepatobiliary: Normal hepatic contour. No discrete hepatic  lesions. Post cholecystectomy with persistent dilatation of the CBD and intrahepatic biliary system, presumably the sequela of biliary reservoir phenomena. No ascites. Pancreas: There is persistent dilatation of the central aspect the pancreatic duct, similar to the 08/2015 examination and without definitive pancreatic lesion on this non pancreatic protocol CT scan. No discrete peripancreatic stranding. 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 No perisplenic stranding. Adrenals/Urinary Tract: Precontrast images demonstrate subintimal contrast involving the origin and proximal aspects of the right renal artery with associated right renal nephrogram however fortunately arterial and delayed venous phase imaging demonstrates symmetric enhancement and excretion of the bilateral kidneys with patency of the right renal artery. Note is made of a punctate (approximately 0.2 cm) nonobstructing right-sided renal stone. No evidence of left-sided nephrolithiasis. Note is made of an approximately 4.0 cm hypoattenuating right-sided renal cyst. No evidence urinary obstruction or perinephric stranding. Normal appearance the bilateral adrenal glands. Excreted contrast is seen within otherwise normal-appearing urinary bladder. Stomach/Bowel: Moderate to large  colonic stool burden without evidence of enteric obstruction. No pneumoperitoneum, pneumatosis or portal venous gas. Normal appearance of the terminal ileum. The appendix is not definitively identified however there is no pericecal inflammatory change. Lymphatic: No bulky retroperitoneal, mesenteric, pelvic or inguinal lymphadenopathy. Reproductive: Post hysterectomy. No discrete adnexal lesion. Small amount of presumably reactive free fluid in the pelvic cul-de-sac. Other: Regional soft tissues appear normal. Musculoskeletal: No acute or aggressive osseous abnormalities. Moderate scoliotic curvature of the thoracolumbar spine with moderate to severe multilevel lumbar spine DDD, worse at L2-L3 and L3-L4 with disc space height loss, endplate irregularity and sclerosis. Review of the MIP images confirms the above findings. IMPRESSION: Vascular Impression: 1. Acute intramural hematoma/type B thoracic aortic dissection extending from the takeoff of the left subclavian artery to the level of the right external iliac artery. There is minimal opacification of the intramural hematoma/false lumen at the level of the distal descending thoracic aorta with otherwise non opacification of false lumen. Minimal amount of perivascular stranding at the level of the thoracic aorta but without evidence of contrast extravasation. 2. Review of the precontrast images demonstrate high-density contrast material within the false lumen of the abdominal aorta, the origin and proximal aspects of the right renal artery as well as the right external iliac artery, as a sequela of subintimal contrast administration during preceding attempted mesenteric arteriogram. 3. Intramural hematoma results in approximately 50% luminal narrowing of the origin the right renal artery. The remaining branch vessels of the abdominal aorta appear widely patent a without hemodynamically significant narrowing. 4. Non flow limiting dissection involving the right common  and external iliac arteries. 5. 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 6.  Aortic Atherosclerosis (ICD10-I70.0). 7. Borderline cardiomegaly. Nonvascular Impression: 1. Indeterminate bilateral pulmonary nodules, largest of which within the right upper lobe measures 6 mm in diameter. By report, pulmonary nodules have been demonstrated on outside examinations and as such, correlation with prior outside examinations is advised. Otherwise, a non-contrast chest CT at 3-6 months is recommended. If the nodules are stable at time of repeat CT, then future CT at 18-24 months (from today's scan) is considered optional for low-risk patients, but is recommended for high-risk patients. This recommendation follows the consensus statement: Guidelines for Management of Incidental Pulmonary Nodules Detected on CT Images: From the Fleischner Society 2017; Radiology 2017;  [id number]. Electronically Signed   By: Sandi Mariscal M.D.   On: 12/27/2018 14:46    Labs:  CBC: Recent Labs    10/21/18 1341 12/27/18 0749 12/28/18 0222  WBC 12.4* 7.2 19.7*  HGB 14.0 13.8 14.3  HCT 42.6 41.7 43.1  PLT 760* 546* 558*    COAGS: No results for input(s): INR, APTT in the last 8760 hours.  BMP: Recent Labs    10/21/18 1341 12/07/18 1020 12/27/18 0749 12/28/18 0222  NA 131*  --  134* 137  K 3.2*  --  3.9 3.8  CL 96*  --  100 101  CO2 25  --  25 21*  GLUCOSE 96  --  70 115*  BUN 11  --  13 14  CALCIUM 8.7*  --  9.1 9.0  CREATININE 0.64 0.70 0.71 0.88  GFRNONAA >60  --  >60 >60  GFRAA >60  --  >60 >60    LIVER FUNCTION TESTS: Recent Labs    10/21/18 1341  BILITOT 0.9  AST 22  ALT 17  ALKPHOS 66  PROT 7.4  ALBUMIN 4.1    Assessment and Plan: Splenic artery pseudoaneurysm  Aortic dissection during procedure yesterday. Procedure stopped.  Patient with a fib with RVR this  AM.   She has resumed her home dose of Eliquis.  Denies feeling as though her heart is racing but did have anxiety earlier.  IR recommendations remain: -Initiate Plavix 81m PO daily x1 month  -Repeat CT Dissection protocol in 1 month with follow-up with Interventional Radiology. -Activity as tolerated.  [place].  No heaving lifting (no lifting greater than 10 lbs).  Appreciate TRH assistance with multiple chronic medical issues.   Stable from a procedure standpoint.  IR available if needed.  Hopefully if she can achieve rate control she will be ready for discharge soon.    Electronically Signed: KDocia Barrier PA 12/29/2018, 12:40 PM   I spent a total of 15 Minutes at the the patient's bedside AND on the patient's hospital floor or unit, greater than 50% of which was counseling/coordinating care for pseudoaneurysm splenic artery, aortic dissection.

## 2018-12-29 NOTE — Progress Notes (Addendum)
Dr. Junious Dresser at bedside. Patient remains in A fib RVR. 5 mg IV metoprolol given at this time per MD order. Patient asymptomatic.   HR 139 BP 112/78.   K. Clement Husbands, RN

## 2018-12-29 NOTE — Progress Notes (Addendum)
P   PROGRESS NOTE    Megan Mercer  [id number] DOB: 07-25-1935 DOA: 12/27/2018 PCP: Clinton Quant, MD   Brief Narrative: 83 year old female with medical history of hypertension, hyperlipidemia, paroxysmal atrial fibrillation on Eliquis, hypothyroidism, Barrett's esophagus, PUD gastroesophageal reflux disease admitted for pseudoaneurysm involving the distal part of the splenic artery.  This was initially thought to be a pancreatic mass was finally determined to be dissection of the ascending thoracic aorta. Vascular surgery evaluated and recommended conservative management with blood pressure control.  Patient is planned for discharge home today but this morning developed A. fib with RVR with heart rate in the 140s to 150s.  Boluses of metoprolol titrated given along with her usual medication of propafenone and diltiazem.  She is currently on anticoagulation with Eliquis 2.5 mg twice daily.  Heart rate is currently in the 120s.  Will discuss with patient on the need to observe her tomorrow so as to achieve a good heart rate control of 80-90 prior to discharge.  Patient reported occasional A. fib with RVR while at home.  She will need to follow-up with her cardiologist for possible antitachycardic pacing versus ablation.  Assessment & Plan:   Principal Problem:   Dissection of ascending aorta (HCC) Active Problems:   Hypothyroidism   Aneurysm, 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HCC)   Paroxysmal atrial fibrillation (HCC)   Chronic anticoagulation   Thrombocytosis (HCC)   Dissecting aneurysm of thoracic aorta, Stanford type B ([place])  1.  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 Continue with blood pressure control with diltiazem, beta-blocker and if continued elevation will add nitroprusside. Vascular surgery okay to discharge patient home to follow-up with them in clinic.  2.  Hypothyroidism  Continue with levothyroxine  3.  Paroxysmal atrial fibrillation.  Patient developed episode of A. fib with RVR, requiring 2 doses of 5 mg metoprolol IV along with her usual medication of propafenone and diltiazem.   Continue with propafenone and Cardizem Continue with Eliquis Will monitor heart rate today and if stable will plan to discharge home tomorrow.  4.  Hyponatremia.  Chronic and stable with sodium at 137 Continue to monitor  5.  Thrombocytosis.  Likely secondary to iron deficiency anemia. Continue with iron supplementation and monitor platelet.    DVT prophylaxis: Eliquis  Code Status: Full  Family Communication: Discussed with patient Disposition Plan: Monitor heart rate overnight and possible discharge tomorrow.    Consultants:  Vascular surgery  Procedures: None    Antimicrobials: None    Subjective: Patient was laying in bed quietly and not in acute distress.  She is alert and oriented x3.  Discussed plan for possible discharge home tomorrow.   Objective: Vitals:   12/29/18 1200 12/29/18 1210 12/29/18 1215 12/29/18 1220  BP: 128/82     Pulse: (!) 116 (!) 120 (!) 117 (!) 121  Resp: (!) 23 (!) 23 (!) 21 17  Temp:      TempSrc:      SpO2: 95% 95% 94% 96%  Weight:      Height:        Intake/Output Summary (Last 24 hours) at 12/29/2018 1252 Last data filed at 12/29/2018 0600 Gross per 24 hour  Intake 300 ml  Output 450 ml  Net -150 ml   Filed Weights   12/27/18 1835  Weight: 55.8 kg    Examination:  General exam: Appears calm and comfortable  Respiratory system: Clear to  auscultation. Respiratory effort normal. Cardiovascular system: S1 & S2 heard, RRR. No JVD, murmurs, rubs, gallops or clicks. No pedal edema. Gastrointestinal system: Abdomen is nondistended, soft and nontender. No organomegaly or masses felt. Normal bowel sounds heard. Central nervous system: Alert and oriented. No focal neurological deficits. Extremities: Symmetric 5 x 5  power. Skin: No rashes, lesions or ulcers Psychiatry: Judgement and insight appear normal. Mood & affect appropriate.     Data Reviewed: I have personally reviewed following labs and imaging studies  CBC: Recent Labs  Lab 12/27/18 0749 12/28/18 0222  WBC 7.2 19.7*  HGB 13.8 14.3  HCT 41.7 43.1  MCV 93.5 91.7  PLT 546* [id number]*   Basic Metabolic Panel: Recent Labs  Lab 12/27/18 0749 12/28/18 0222  NA 134* 137  K 3.9 3.8  CL 100 101  CO2 25 21*  GLUCOSE 70 115*  BUN 13 14  CREATININE 0.71 0.88  CALCIUM 9.1 9.0   GFR: Estimated Creatinine Clearance: 43.3 mL/min (by C-G formula based on SCr of 0.88 mg/dL). Liver Function Tests: No results for input(s): AST, ALT, ALKPHOS, BILITOT, PROT, ALBUMIN in the last 168 hours. No results for input(s): LIPASE, AMYLASE in the last 168 hours. No results for input(s): AMMONIA in the last 168 hours. Coagulation Profile: No results for input(s): INR, PROTIME in the last 168 hours. Cardiac Enzymes: No results for input(s): CKTOTAL, CKMB, CKMBINDEX, TROPONINI in the last 168 hours. BNP (last 3 results) No results for input(s): PROBNP in the last 8760 hours. HbA1C: No results for input(s): HGBA1C in the last 72 hours. CBG: No results for input(s): GLUCAP in the last 168 hours. Lipid Profile: No results for input(s): CHOL, HDL, LDLCALC, TRIG, CHOLHDL, LDLDIRECT in the last 72 hours. Thyroid Function Tests: No results for input(s): TSH, T4TOTAL, FREET4, T3FREE, THYROIDAB in the last 72 hours. Anemia Panel: Recent Labs    12/28/18 1126  FERRITIN 114  TIBC 302  IRON 20*   Sepsis Labs: No results for input(s): PROCALCITON, LATICACIDVEN in the last 168 hours.  No results found for this or any previous visit (from the past 240 hour(s)).       Radiology Studies: No results found.      Scheduled Meds: . apixaban  2.5 mg Oral BID  . clopidogrel  75 mg Oral Daily  . colestipol  2 g Oral BID  . diltiazem  120 mg Oral Daily   . ezetimibe  10 mg Oral Daily  . latanoprost  1 drop Both Eyes QHS  . levothyroxine  75 mcg Oral QAC breakfast  . metoprolol tartrate  25 mg Oral BID  . pantoprazole  40 mg Oral Daily  . propafenone  225 mg Oral BID  . raloxifene  60 mg Oral Daily  . sodium chloride flush  3 mL Intravenous Q12H  . sodium chloride flush  3 mL Intravenous Q12H  . timolol  1 drop Both Eyes Daily     LOS: 1 day    Time spent: 25 minutes    Elie Confer, MD [facility] Pager [phone] `  If 7PM-7AM, please contact night-coverage [email or web address] Password [email or web address] 12/29/2018, 12:52 PM

## 2018-12-29 NOTE — Progress Notes (Signed)
Patient ID: Megan Mercer, female   DOB: 1935-03-25, 83 y.o.   MRN: [id number]  Progress Note    12/29/2018 6:04 PM * No surgery found *  Subjective: Still with some back pain most likely related to being in bed with chronic back discomfort.  Developed rapid ventricular response A. fib   Vitals:   12/29/18 1600 12/29/18 1704  BP: 113/69 117/66  Pulse: (!) 123   Resp: 20   Temp: 98.1 F (36.7 C)   SpO2: 97%    Physical Exam: Heart rate currently 1 30-1 45.  CBC    Component Value Date/Time   WBC 19.7 (H) 12/28/2018 0222   RBC 4.70 12/28/2018 0222   HGB 14.3 12/28/2018 0222   HCT 43.1 12/28/2018 0222   PLT 558 (H) 12/28/2018 0222   MCV 91.7 12/28/2018 0222   MCH 30.4 12/28/2018 0222   MCHC 33.2 12/28/2018 0222   RDW 16.0 (H) 12/28/2018 0222   LYMPHSABS 1.4 10/17/2017 1030   MONOABS 0.9 10/17/2017 1030   EOSABS 0.1 10/17/2017 1030   BASOSABS 0.0 10/17/2017 1030    BMET    Component Value Date/Time   NA 137 12/28/2018 0222   K 3.8 12/28/2018 0222   CL 101 12/28/2018 0222   CO2 21 (L) 12/28/2018 0222   GLUCOSE 115 (H) 12/28/2018 0222   BUN 14 12/28/2018 0222   CREATININE 0.88 12/28/2018 0222   CALCIUM 9.0 12/28/2018 0222   GFRNONAA >60 12/28/2018 0222   GFRAA >60 12/28/2018 0222    INR No results found for: INR   Intake/Output Summary (Last 24 hours) at 12/29/2018 1804 Last data filed at 12/29/2018 1417 Gross per 24 hour  Intake 300 ml  Output 750 ml  Net -450 ml     Assessment/Plan:  83 y.o. female stable status post iatrogenic thoracic dissection.  No evidence of malperfusion.  Will not follow actively.  Notes from radiology reviewed.  Plan for follow-up CT scan in 1 month.  Available if we can assist     [name], MD [facility] [phone] 12/29/2018 6:04 PM

## 2018-12-30 DIAGNOSIS — I7101 Dissection of thoracic aorta: Secondary | ICD-10-CM | POA: Diagnosis not present

## 2018-12-30 LAB — CBC
HCT: 41.7 % (ref 36.0–46.0)
Hemoglobin: 13.9 g/dL (ref 12.0–15.0)
MCH: 30.5 pg (ref 26.0–34.0)
MCHC: 33.3 g/dL (ref 30.0–36.0)
MCV: 91.6 fL (ref 80.0–100.0)
Platelets: 491 10*3/uL — ABNORMAL HIGH (ref 150–400)
RBC: 4.55 MIL/uL (ref 3.87–5.11)
RDW: 16 % — ABNORMAL HIGH (ref 11.5–15.5)
WBC: 17.3 10*3/uL — ABNORMAL HIGH (ref 4.0–10.5)
nRBC: 0 % (ref 0.0–0.2)

## 2018-12-30 LAB — COMPREHENSIVE METABOLIC PANEL
ALT: 13 U/L (ref 0–44)
AST: 19 U/L (ref 15–41)
Albumin: 3 g/dL — ABNORMAL LOW (ref 3.5–5.0)
Alkaline Phosphatase: 45 U/L (ref 38–126)
Anion gap: 12 (ref 5–15)
BUN: 22 mg/dL (ref 8–23)
CO2: 21 mmol/L — ABNORMAL LOW (ref 22–32)
Calcium: 8.5 mg/dL — ABNORMAL LOW (ref 8.9–10.3)
Chloride: 98 mmol/L (ref 98–111)
Creatinine, Ser: 1.05 mg/dL — ABNORMAL HIGH (ref 0.44–1.00)
GFR calc Af Amer: 58 mL/min — ABNORMAL LOW (ref >60)
GFR calc non Af Amer: 50 mL/min — ABNORMAL LOW (ref >60)
Glucose, Bld: 119 mg/dL — ABNORMAL HIGH (ref 70–99)
Potassium: 4 mmol/L (ref 3.5–5.1)
Sodium: 131 mmol/L — ABNORMAL LOW (ref 135–145)
Total Bilirubin: 1.8 mg/dL — ABNORMAL HIGH (ref 0.3–1.2)
Total Protein: 6.2 g/dL — ABNORMAL LOW (ref 6.5–8.1)

## 2018-12-30 LAB — URINALYSIS, ROUTINE W REFLEX MICROSCOPIC
Bilirubin Urine: NEGATIVE
Glucose, UA: NEGATIVE mg/dL
Ketones, ur: 20 mg/dL — AB
Nitrite: NEGATIVE
Protein, ur: 100 mg/dL — AB
Specific Gravity, Urine: 1.023 (ref 1.005–1.030)
WBC, UA: >50 WBC/hpf — ABNORMAL HIGH (ref 0–5)
pH: 5 (ref 5.0–8.0)

## 2018-12-30 LAB — MAGNESIUM: Magnesium: 2.1 mg/dL (ref 1.7–2.4)

## 2018-12-30 LAB — PHOSPHORUS: Phosphorus: 3.5 mg/dL (ref 2.5–4.6)

## 2018-12-30 LAB — BILIRUBIN, DIRECT: Bilirubin, Direct: 0.4 mg/dL — ABNORMAL HIGH (ref 0.0–0.2)

## 2018-12-30 LAB — GLUCOSE, CAPILLARY: Glucose-Capillary: 122 mg/dL — ABNORMAL HIGH (ref 70–99)

## 2018-12-30 LAB — TSH: TSH: 7.972 u[IU]/mL — ABNORMAL HIGH (ref 0.350–4.500)

## 2018-12-30 MED ORDER — DILTIAZEM HCL 60 MG PO TABS
30.0000 mg | ORAL_TABLET | Freq: Four times a day (QID) | ORAL | Status: AC
  Administered 2018-12-30 – 2018-12-31 (×5): 30 mg via ORAL
  Filled 2018-12-30 (×5): qty 1

## 2018-12-30 MED ORDER — CALCIUM CARBONATE ANTACID 500 MG PO CHEW
1.0000 | CHEWABLE_TABLET | Freq: Three times a day (TID) | ORAL | Status: AC
  Administered 2018-12-30 – 2018-12-31 (×6): 200 mg via ORAL
  Filled 2018-12-30 (×6): qty 1

## 2018-12-30 MED ORDER — NITROFURANTOIN MONOHYD MACRO 100 MG PO CAPS
100.0000 mg | ORAL_CAPSULE | Freq: Two times a day (BID) | ORAL | Status: AC
  Administered 2018-12-30 – 2019-01-03 (×10): 100 mg via ORAL
  Filled 2018-12-30 (×10): qty 1

## 2018-12-30 MED ORDER — SODIUM CHLORIDE 0.9 % IV SOLN: INTRAVENOUS | Status: DC

## 2018-12-30 MED ORDER — SODIUM CHLORIDE 0.9 % IV BOLUS: 500.0000 mL | Freq: Once | INTRAVENOUS | Status: DC

## 2018-12-30 MED ORDER — CHLORHEXIDINE GLUCONATE CLOTH 2 % EX PADS
6.0000 | MEDICATED_PAD | Freq: Every day | CUTANEOUS | Status: DC
  Administered 2018-12-30 – 2019-01-01 (×3): 6 via TOPICAL

## 2018-12-30 MED ORDER — SODIUM CHLORIDE 0.9 % IV SOLN
INTRAVENOUS | Status: DC
  Administered 2018-12-30: 05:00:00 via INTRAVENOUS

## 2018-12-30 MED ORDER — SODIUM CHLORIDE 0.9 % IV BOLUS
1000.0000 mL | Freq: Once | INTRAVENOUS | Status: AC
  Administered 2018-12-30: 1000 mL via INTRAVENOUS

## 2018-12-30 NOTE — Progress Notes (Signed)
Patient had to urinate and Cordelia N.T. went into put her on bedpan.  And patient refused to use bedpan and wanted to get up to [facility]. N.T. and Daryl R.N. got patient up as I was in another room with another patient. I went into check on her and patient was setting  On BSC and Diaphoretic and had a blank star and did not answer me at first. .Drema Dallas R.N. to help get back in bed and patient was alert and orin. And B.P. was taken 103 / 67 p 122 Explain to back she cannot get out of bed anymore tonight til M.D. comes and see her. Ofter Pure wick and she refuses at this time.

## 2018-12-30 NOTE — Progress Notes (Signed)
CBG 122 

## 2018-12-30 NOTE — Progress Notes (Signed)
Received page from bedside RN regarding increased fatigue/weakness, hypotension. According to bedside RN, pt appears "out of it" at times and has had intermittent episodes of nonresponsiveness. The patient is refusing to eat or drink anything at this time stating she feels nauseous.  On assessment, pt is alert and oriented and states that she does feel weaker than usual. She is in afib with HR 100-120's. Blood pressure remains soft.   Generalized weakness/ intermittent unresponsiveness - CBG checked and WNL - CBC and CMP ordered - UA ordered  Hypotension - IVF fluid bolus given - Started on ns @75cc /hr - 2200 of cardizem dose held  Megan Newcomer, NP  [facility] 7p-7a [phone]

## 2018-12-30 NOTE — Progress Notes (Signed)
N. S. Fl. Bolus 1000 ml in and B.P.. 114/77 Patietn resting comfort.

## 2018-12-30 NOTE — Progress Notes (Addendum)
I was at lunch and patient had spilled ice water in her bed and [facility]. went into help patient and patient needed to use BSC. Sharrie Rothman placed her on [facility] and patient began to feel very weak and nauseated and had to be pick up and put back in back . She was a little diaphoetic  and warm. Patient ask "What is wrong with me?" Explain could be several things and explain to her what maybe. B.P. was 91/62 H.R. 100 At. Fib. sats 100% room air and no fever. Patient has not been eating and drinking much . Not voiding much and when she does it is amber.No pain Mindy R. N. With Rapid response was called and made aware. And called X. Blount N.P. was called and aware of today's events. See orders. Butch Penny R.N. also aware and into assess patient.

## 2018-12-30 NOTE — Significant Event (Addendum)
Rapid Response Event Note  Time Called: 0017 Arrival Time: 0110 Event Type: Hypotension   Overview: Consulted d/t ??hypotension and need for fluids. Pt here with splenic pseudoaneurysm and went into AFIB with RVR yesterday. Pt was given lopressor x 2, ativan for anxiety, and phenergan for nausea. She was lethargic with poor PO intake and decreased urine output t/o the day. Prior to calling RRT, pt got weak, diaphoretic, and nauseous when on BSC.  SBP-90s at that time. Pt alert and oriented, but very weak. SBP has been 120s-130s t/o day. RRT recommended calling NP to see if pt would benefit from some fluid since she seems dehydrated from poor po intake and decreased urine output, and symptomatic to SBP-90s. Blount, NP notified and ordered:  CBG-122 CBC: WBC-19.7>17.3           Creat-0.88>1.05  U/A 1L NS bolus  Pt seen at 0115. BP-106/67 and pt resting.   Interventions:  See above orders from [facility], NP  Plan of Care (if not transferred): ??? Need for maintenance IVF if poor po intake continues??? Give bolus, alert MD if lab abnormalities, continue to monitor pt. Call RRT if further assistance.  Event Summary: Name of Physician Notified: Kennon Holter, NP at (PTA RRT)    at    Outcome: Stayed in room and stabalized  Event End Time: 0120  Dillard Essex

## 2018-12-30 NOTE — Progress Notes (Signed)
P   PROGRESS NOTE    Megan Mercer  [id number] DOB: [birth year] DOA: 12/27/2018 PCP: Clinton Quant, MD   Brief Narrative: 83 year old female with medical history of hypertension, hyperlipidemia, paroxysmal atrial fibrillation on Eliquis, hypothyroidism, Barrett's esophagus, PUD gastroesophageal reflux disease admitted for pseudoaneurysm involving the distal part of the splenic artery.  This was initially thought to be a pancreatic mass was finally determined to be dissection of the ascending thoracic aorta. Vascular surgery evaluated and recommended conservative management with blood pressure control.  Patient is planned for discharge home today but this morning developed A. fib with RVR with heart rate in the 140s to 150s.  Boluses of metoprolol titrated given along with her usual medication of propafenone and diltiazem.  She is currently on anticoagulation with Eliquis 2.5 mg twice daily.  Heart rate is currently in the 120s.  Will discuss with patient on the need to observe her tomorrow so as to achieve a good heart rate control of 80-90 prior to discharge.  Patient reported occasional A. fib with RVR while at home.  She will need to follow-up with her cardiologist for possible antitachycardic pacing versus ablation. Rapid response was called overnight due to increased fatigue with weakness and hypotension and transient unresponsiveness.  She was given IV fluid bolus and started on 75 cc an hour.  Dose of Cardizem was withheld.  Patient appear stable currently, alert and oriented x3 and requesting to be discharged so she can feel better at home.  Agreed to observe patient today and try to achieve better control of her heart rate. For possible discharge home tomorrow with clinical improvement.   Assessment & Plan:   Principal Problem:   Dissection of ascending aorta (HCC) Active Problems:   Hypothyroidism   Aneurysm, 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HCC)   Paroxysmal atrial fibrillation (HCC)  Chronic anticoagulation   Thrombocytosis (HCC)   Dissecting aneurysm of thoracic aorta, Stanford type B ([place])  1.  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 Continue with blood pressure control with diltiazem, beta-blocker and if continued elevation will add nitroprusside. Vascular surgery okay to discharge patient home to follow-up with them in clinic.  2.  Hypothyroidism Continue with levothyroxine  3.  Paroxysmal atrial fibrillation.  Patient developed episode of A. fib with RVR, requiring 2 doses of 5 mg metoprolol IV along with her usual medication of propafenone and diltiazem.   Continue with propafenone and Cardizem Continue with Eliquis Will monitor heart rate today and if stable will plan to discharge home tomorrow.  4.  Hyponatremia.  Chronic and stable with sodium at 137 Continue to monitor  5.  Thrombocytosis.  Likely secondary to iron deficiency anemia. Continue with iron supplementation and monitor platelet.  6.  Hypotension/fatigue with weakness and transient unresponsiveness. Had a rapid response called.  Fluid bolus was given and IV fluid at 75 cc an hour.  A dose of Cardizem was required. Patient is clinically stable except for heart rate in the 100-110 Continue with diltiazem and titrate up if needed. Possible discharge tomorrow with control of heart rate below 90 bpm.  DVT prophylaxis: Eliquis  Code Status: Full  Family Communication: Discussed with patient Disposition Plan: Monitor heart rate overnight and possible discharge tomorrow.    Consultants:  Vascular surgery  Procedures: None    Antimicrobials: None    Subjective: Patient was laying in bed quietly and not in acute distress.  She is  alert and oriented x3.  Discussed plan for possible discharge home tomorrow.   Objective: Vitals:   12/30/18 1155 12/30/18 1200 12/30/18 1300 12/30/18 1400   BP: 110/67 112/70 121/74 127/87  Pulse: 99 (!) 105 95 (!) 106  Resp: (!) 21   19  Temp: 98.5 F (36.9 C)     TempSrc: Oral     SpO2: 95%     Weight:      Height:        Intake/Output Summary (Last 24 hours) at 12/30/2018 1416 Last data filed at 12/30/2018 0556 Gross per 24 hour  Intake 1180 ml  Output 760 ml  Net 420 ml   Filed Weights   12/27/18 1835  Weight: 55.8 kg    Examination:  General exam: Appears calm and comfortable  Respiratory system: Clear to auscultation. Respiratory effort normal. Cardiovascular system: S1 & S2 heard, RRR. No JVD, murmurs, rubs, gallops or clicks. No pedal edema. Gastrointestinal system: Abdomen is nondistended, soft and nontender. No organomegaly or masses felt. Normal bowel sounds heard. Central nervous system: Alert and oriented. No focal neurological deficits. Extremities: Symmetric 5 x 5 power. Skin: No rashes, lesions or ulcers Psychiatry: Judgement and insight appear normal. Mood & affect appropriate.     Data Reviewed: I have personally reviewed following labs and imaging studies  CBC: Recent Labs  Lab 12/27/18 0749 12/28/18 0222 12/30/18 0054  WBC 7.2 19.7* 17.3*  HGB 13.8 14.3 13.9  HCT 41.7 43.1 41.7  MCV 93.5 91.7 91.6  PLT 546* 558* [id number]*   Basic Metabolic Panel: Recent Labs  Lab 12/27/18 0749 12/28/18 0222 12/30/18 0054  NA 134* 137 131*  K 3.9 3.8 4.0  CL 100 101 98  CO2 25 21* 21*  GLUCOSE 70 115* 119*  BUN 13 14 22   CREATININE 0.71 0.88 1.05*  CALCIUM 9.1 9.0 8.5*  MG  --   --  2.1  PHOS  --   --  3.5   GFR: Estimated Creatinine Clearance: 36.3 mL/min (A) (by C-G formula based on SCr of 1.05 mg/dL (H)). Liver Function Tests: Recent Labs  Lab 12/30/18 0054  AST 19  ALT 13  ALKPHOS 45  BILITOT 1.8*  PROT 6.2*  ALBUMIN 3.0*   No results for input(s): LIPASE, AMYLASE in the last 168 hours. No results for input(s): AMMONIA in the last 168 hours. Coagulation Profile: No results for input(s):  INR, PROTIME in the last 168 hours. Cardiac Enzymes: No results for input(s): CKTOTAL, CKMB, CKMBINDEX, TROPONINI in the last 168 hours. BNP (last 3 results) No results for input(s): PROBNP in the last 8760 hours. HbA1C: No results for input(s): HGBA1C in the last 72 hours. CBG: Recent Labs  Lab 12/30/18 0035  GLUCAP 122*   Lipid Profile: No results for input(s): CHOL, HDL, LDLCALC, TRIG, CHOLHDL, LDLDIRECT in the last 72 hours. Thyroid Function Tests: Recent Labs    12/30/18 0054  TSH 7.972*   Anemia Panel: Recent Labs    12/28/18 1126  FERRITIN 114  TIBC 302  IRON 20*   Sepsis Labs: No results for input(s): PROCALCITON, LATICACIDVEN in the last 168 hours.  No results found for this or any previous visit (from the past 240 hour(s)).       Radiology Studies: No results found.      Scheduled Meds: . apixaban  2.5 mg Oral BID  . calcium carbonate  1 tablet Oral TID  . Chlorhexidine Gluconate Cloth  6 each Topical Daily  . clopidogrel  75 mg Oral Daily  . colestipol  2 g Oral BID  . diltiazem  30 mg Oral Q6H  . ezetimibe  10 mg Oral Daily  . latanoprost  1 drop Both Eyes QHS  . levothyroxine  75 mcg Oral QAC breakfast  . metoprolol tartrate  25 mg Oral BID  . nitrofurantoin (macrocrystal-monohydrate)  100 mg Oral Q12H  . pantoprazole  40 mg Oral Daily  . propafenone  225 mg Oral BID  . raloxifene  60 mg Oral Daily  . sodium chloride flush  3 mL Intravenous Q12H  . sodium chloride flush  3 mL Intravenous Q12H  . timolol  1 drop Both Eyes Daily     LOS: 2 days    Time spent: 25 minutes    Elie Confer, MD [facility] Pager [phone] `  If 7PM-7AM, please contact night-coverage [email or web address] Password [email or web address] 12/30/2018, 2:16 PM

## 2018-12-30 NOTE — Progress Notes (Signed)
Text page X. Blount N.P. to look at U.A. results.

## 2018-12-31 ENCOUNTER — Encounter (HOSPITAL_COMMUNITY): Payer: Self-pay

## 2018-12-31 DIAGNOSIS — I7101 Dissection of thoracic aorta: Secondary | ICD-10-CM | POA: Diagnosis not present

## 2018-12-31 LAB — COMPREHENSIVE METABOLIC PANEL
ALT: 10 U/L (ref 0–44)
AST: 14 U/L — ABNORMAL LOW (ref 15–41)
Albumin: 2.6 g/dL — ABNORMAL LOW (ref 3.5–5.0)
Alkaline Phosphatase: 52 U/L (ref 38–126)
Anion gap: 10 (ref 5–15)
BUN: 19 mg/dL (ref 8–23)
CO2: 19 mmol/L — ABNORMAL LOW (ref 22–32)
Calcium: 8.5 mg/dL — ABNORMAL LOW (ref 8.9–10.3)
Chloride: 102 mmol/L (ref 98–111)
Creatinine, Ser: 0.84 mg/dL (ref 0.44–1.00)
GFR calc Af Amer: >60 mL/min (ref >60)
GFR calc non Af Amer: >60 mL/min (ref >60)
Glucose, Bld: 105 mg/dL — ABNORMAL HIGH (ref 70–99)
Potassium: 4.2 mmol/L (ref 3.5–5.1)
Sodium: 131 mmol/L — ABNORMAL LOW (ref 135–145)
Total Bilirubin: 1.7 mg/dL — ABNORMAL HIGH (ref 0.3–1.2)
Total Protein: 5.8 g/dL — ABNORMAL LOW (ref 6.5–8.1)

## 2018-12-31 LAB — CBC
HCT: 40.6 % (ref 36.0–46.0)
Hemoglobin: 13.4 g/dL (ref 12.0–15.0)
MCH: 30.6 pg (ref 26.0–34.0)
MCHC: 33 g/dL (ref 30.0–36.0)
MCV: 92.7 fL (ref 80.0–100.0)
Platelets: 566 10*3/uL — ABNORMAL HIGH (ref 150–400)
RBC: 4.38 MIL/uL (ref 3.87–5.11)
RDW: 15.6 % — ABNORMAL HIGH (ref 11.5–15.5)
WBC: 14.4 10*3/uL — ABNORMAL HIGH (ref 4.0–10.5)
nRBC: 0 % (ref 0.0–0.2)

## 2018-12-31 LAB — MAGNESIUM: Magnesium: 2 mg/dL (ref 1.7–2.4)

## 2018-12-31 LAB — PHOSPHORUS: Phosphorus: 2.8 mg/dL (ref 2.5–4.6)

## 2018-12-31 LAB — GLUCOSE, CAPILLARY: Glucose-Capillary: 128 mg/dL — ABNORMAL HIGH (ref 70–99)

## 2018-12-31 MED ORDER — ALBUMIN HUMAN 25 % IV SOLN
25.0000 g | INTRAVENOUS | Status: AC
  Administered 2018-12-31 (×2): 25 g via INTRAVENOUS
  Filled 2018-12-31 (×2): qty 100

## 2018-12-31 MED ORDER — METOPROLOL TARTRATE 50 MG PO TABS
50.0000 mg | ORAL_TABLET | Freq: Two times a day (BID) | ORAL | Status: DC
  Administered 2018-12-31 – 2019-01-01 (×2): 50 mg via ORAL
  Filled 2018-12-31 (×2): qty 1

## 2018-12-31 MED ORDER — PROPAFENONE HCL 225 MG PO TABS
225.0000 mg | ORAL_TABLET | Freq: Three times a day (TID) | ORAL | Status: DC
  Administered 2018-12-31 – 2019-01-04 (×13): 225 mg via ORAL
  Filled 2018-12-31 (×16): qty 1

## 2018-12-31 MED ORDER — DILTIAZEM HCL 60 MG PO TABS: 30.0000 mg | ORAL_TABLET | Freq: Four times a day (QID) | ORAL | Status: DC

## 2018-12-31 NOTE — Progress Notes (Signed)
Pt received Albumin, see MAR. Pt's BP now 102/66. HR 125. Pt resting comfortably in bed.

## 2018-12-31 NOTE — Progress Notes (Signed)
  Checked on Megan Mercer today.  She tells me she is still weak and not eating much, but she really wants to go home.  She is depressed but can't take her medicines due to nausea.  Chart reviewed. No visit yet today for hospitalist.  Will continue to follow.  She will need to be on Plavix x 1 month upon D/C.  Tanikka Bresnan S Anavi Branscum PA-C 12/31/2018 3:07 PM

## 2018-12-31 NOTE — Progress Notes (Addendum)
Pt assisted to bathroom, per pt request. Pt stated that she felt dizzy, nauseous, and sweaty. Pt assisted to wheelchair and back to bed. BP 95/63. HR 95-110. Glucose 128. Pt stated that she had not eaten since yesterday. Pt provided with a snack. Bed alarm on and call light is within reach. Will continue to monitor.

## 2018-12-31 NOTE — Progress Notes (Signed)
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  This was initially thought to be a pancreatic mass was finally determined to be dissection of the ascending thoracic aorta. Vascular surgery evaluated and recommended conservative management with blood pressure control.  Patient is planned for discharge home today but this morning developed A. fib with RVR with heart rate in the 140s to 150s.  Boluses of metoprolol titrated given along with her usual medication of propafenone and diltiazem.  She is currently on anticoagulation with Eliquis 2.5 mg twice daily.  Heart rate is currently in the 97-120s.   Evaluated patient at bedside this morning.  Patient is anxious to go home however heart rate has been between 97-120. I consulted cardiologist on call Dr. Johnsie Cancel who advised to consult patient's primary cardiologist but however to uptitrate patient's propafenone to 3 times daily. I called patient's primary cardiologist and spoke with the cardiologist on-call.  He advised against discharging patient with a heart rate in the 120s and symptomatic dizziness and low blood pressure.  He stated that patient has never been discharged from their care with heart rate of greater than 100. He advised introduction of beta-blocker and optimize for rate control.  Advised to stay away of diltiazem. Started patient on metoprolol tartrate 50 mg twice daily and to titrate as blood pressure permits. Patient reported episode of dizziness with blood pressure in systolic of 0000000.  Gave 2 doses of albumin.  Assessment & Plan:   Principal Problem:   Dissection of ascending aorta  (HCC) Active Problems:   Hypothyroidism   Aneurysm, 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HCC)   Paroxysmal atrial fibrillation (HCC)   Chronic anticoagulation   Thrombocytosis (HCC)   Dissecting aneurysm of thoracic aorta, Stanford type B ([place])  1.  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 Vascular surgery okay to discharge patient home to follow-up with them in clinic.  2.  Hypothyroidism Continue with levothyroxine  3.  Paroxysmal atrial fibrillation.  Patient developed episode of A. fib with RVR, requiring 2 doses of 5 mg metoprolol IV along with her usual medication of propafenone and diltiazem.   Primary cardiologist advised uptitrating metoprolol as blood pressure permits for rate control.  Avoid diltiazem and continue with propafenone.   Continue with Eliquis We will continue to monitor.    4.  Hyponatremia.  Chronic and stable with sodium at 137 Continue to monitor  5.  Thrombocytosis.  Likely secondary to iron deficiency anemia. Continue with iron supplementation and monitor platelet.  6.  Hypotension/fatigue with weakness  Will administer Albumin. Monitor vital signs  DVT prophylaxis: Eliquis  Code Status: Full  Family Communication: Discussed with patient Disposition Plan: Pending clinical improvement  Consultants:  Vascular surgery  Procedures: None    Antimicrobials: None    Subjective: Patient was laying in bed quietly and not in acute distress.  She is alert and oriented x3.  Discussed plan for possible discharge home tomorrow.   Objective: Vitals:   12/31/18 0800 12/31/18 0855 12/31/18 1116 12/31/18 1350  BP:  112/81 107/72 95/63  Pulse: (!) 133 (!) 126 94 97  Resp: 17 16 16 16   Temp:  97.6 F (36.4 C)   TempSrc:   Oral   SpO2:      Weight:      Height:        Intake/Output Summary (Last 24 hours) at 12/31/2018 1538 Last data filed at 12/31/2018 1500  Gross per 24 hour  Intake 156.41 ml  Output 700 ml  Net -543.59 ml   Filed Weights   12/27/18 1835 12/31/18 0353  Weight: 55.8 kg 53.1 kg    Examination:  General exam: Appears calm and comfortable  Respiratory system: Clear to auscultation. Respiratory effort normal. Cardiovascular system: S1 & S2 heard, RRR. No JVD, murmurs, rubs, gallops or clicks. No pedal edema. Gastrointestinal system: Abdomen is nondistended, soft and nontender. No organomegaly or masses felt. Normal bowel sounds heard. Central nervous system: Alert and oriented. No focal neurological deficits. Extremities: Symmetric 5 x 5 power. Skin: No rashes, lesions or ulcers Psychiatry: Judgement and insight appear normal. Mood & affect appropriate.     Data Reviewed: I have personally reviewed following labs and imaging studies  CBC: Recent Labs  Lab 12/27/18 0749 12/28/18 0222 12/30/18 0054 12/31/18 0238  WBC 7.2 19.7* 17.3* 14.4*  HGB 13.8 14.3 13.9 13.4  HCT 41.7 43.1 41.7 40.6  MCV 93.5 91.7 91.6 92.7  PLT 546* 558* 491* 123456*   Basic Metabolic Panel: Recent Labs  Lab 12/27/18 0749 12/28/18 0222 12/30/18 0054 12/31/18 0238  NA 134* 137 131* 131*  K 3.9 3.8 4.0 4.2  CL 100 101 98 102  CO2 25 21* 21* 19*  GLUCOSE 70 115* 119* 105*  BUN 13 14 22 19   CREATININE 0.71 0.88 1.05* 0.84  CALCIUM 9.1 9.0 8.5* 8.5*  MG  --   --  2.1 2.0  PHOS  --   --  3.5 2.8   GFR: Estimated Creatinine Clearance: 44 mL/min (by C-G formula based on SCr of 0.84 mg/dL). Liver Function Tests: Recent Labs  Lab 12/30/18 0054 12/31/18 0238  AST 19 14*  ALT 13 10  ALKPHOS 45 52  BILITOT 1.8* 1.7*  PROT 6.2* 5.8*  ALBUMIN 3.0* 2.6*   No results for input(s): LIPASE, AMYLASE in the last 168 hours. No results for input(s): AMMONIA in the last 168 hours. Coagulation Profile: No results for input(s): INR, PROTIME in the last 168 hours. Cardiac Enzymes: No results for input(s): CKTOTAL, CKMB, CKMBINDEX, TROPONINI  in the last 168 hours. BNP (last 3 results) No results for input(s): PROBNP in the last 8760 hours. HbA1C: No results for input(s): HGBA1C in the last 72 hours. CBG: Recent Labs  Lab 12/30/18 0035 12/31/18 1348  GLUCAP 122* 128*   Lipid Profile: No results for input(s): CHOL, HDL, LDLCALC, TRIG, CHOLHDL, LDLDIRECT in the last 72 hours. Thyroid Function Tests: Recent Labs    12/30/18 0054  TSH 7.972*   Anemia Panel: No results for input(s): VITAMINB12, FOLATE, FERRITIN, TIBC, IRON, RETICCTPCT in the last 72 hours. Sepsis Labs: No results for input(s): PROCALCITON, LATICACIDVEN in the last 168 hours.  No results found for this or any previous visit (from the past 240 hour(s)).       Radiology Studies: No results found.      Scheduled Meds: . apixaban  2.5 mg Oral BID  . calcium carbonate  1 tablet Oral TID  . Chlorhexidine Gluconate Cloth  6 each Topical Daily  . clopidogrel  75 mg Oral Daily  . colestipol  2 g Oral BID  . diltiazem  30 mg Oral Q6H  .  ezetimibe  10 mg Oral Daily  . latanoprost  1 drop Both Eyes QHS  . levothyroxine  75 mcg Oral QAC breakfast  . metoprolol tartrate  50 mg Oral BID  . nitrofurantoin (macrocrystal-monohydrate)  100 mg Oral Q12H  . pantoprazole  40 mg Oral Daily  . propafenone  225 mg Oral TID  . raloxifene  60 mg Oral Daily  . sodium chloride flush  3 mL Intravenous Q12H  . sodium chloride flush  3 mL Intravenous Q12H  . timolol  1 drop Both Eyes Daily     LOS: 3 days    Time spent: 35 minutes    Elie Confer, MD [facility] Pager [phone] `  If 7PM-7AM, please contact night-coverage [email or web address] Password [facility] 12/31/2018, 3:38 PM

## 2019-01-01 LAB — COMPREHENSIVE METABOLIC PANEL
ALT: 13 U/L (ref 0–44)
AST: 17 U/L (ref 15–41)
Albumin: 3.3 g/dL — ABNORMAL LOW (ref 3.5–5.0)
Alkaline Phosphatase: 50 U/L (ref 38–126)
Anion gap: 10 (ref 5–15)
BUN: 20 mg/dL (ref 8–23)
CO2: 20 mmol/L — ABNORMAL LOW (ref 22–32)
Calcium: 8.5 mg/dL — ABNORMAL LOW (ref 8.9–10.3)
Chloride: 100 mmol/L (ref 98–111)
Creatinine, Ser: 0.85 mg/dL (ref 0.44–1.00)
GFR calc Af Amer: >60 mL/min (ref >60)
GFR calc non Af Amer: >60 mL/min (ref >60)
Glucose, Bld: 102 mg/dL — ABNORMAL HIGH (ref 70–99)
Potassium: 4 mmol/L (ref 3.5–5.1)
Sodium: 130 mmol/L — ABNORMAL LOW (ref 135–145)
Total Bilirubin: 1.3 mg/dL — ABNORMAL HIGH (ref 0.3–1.2)
Total Protein: 6 g/dL — ABNORMAL LOW (ref 6.5–8.1)

## 2019-01-01 LAB — CBC
HCT: 35.6 % — ABNORMAL LOW (ref 36.0–46.0)
Hemoglobin: 11.8 g/dL — ABNORMAL LOW (ref 12.0–15.0)
MCH: 30.4 pg (ref 26.0–34.0)
MCHC: 33.1 g/dL (ref 30.0–36.0)
MCV: 91.8 fL (ref 80.0–100.0)
Platelets: 592 10*3/uL — ABNORMAL HIGH (ref 150–400)
RBC: 3.88 MIL/uL (ref 3.87–5.11)
RDW: 15.5 % (ref 11.5–15.5)
WBC: 11.7 10*3/uL — ABNORMAL HIGH (ref 4.0–10.5)
nRBC: 0 % (ref 0.0–0.2)

## 2019-01-01 LAB — PHOSPHORUS: Phosphorus: 1.7 mg/dL — ABNORMAL LOW (ref 2.5–4.6)

## 2019-01-01 LAB — MAGNESIUM: Magnesium: 1.8 mg/dL (ref 1.7–2.4)

## 2019-01-01 MED ORDER — ALUM & MAG HYDROXIDE-SIMETH 200-200-20 MG/5ML PO SUSP
30.0000 mL | Freq: Once | ORAL | Status: AC
  Administered 2019-01-01: 30 mL via ORAL
  Filled 2019-01-01: qty 30

## 2019-01-01 MED ORDER — POTASSIUM & SODIUM PHOSPHATES 280-160-250 MG PO PACK
1.0000 | PACK | Freq: Three times a day (TID) | ORAL | Status: AC
  Administered 2019-01-01 (×3): 1 via ORAL
  Filled 2019-01-01 (×4): qty 1

## 2019-01-01 MED ORDER — METOPROLOL TARTRATE 50 MG PO TABS
75.0000 mg | ORAL_TABLET | Freq: Two times a day (BID) | ORAL | Status: DC
  Administered 2019-01-01 – 2019-01-02 (×2): 75 mg via ORAL
  Filled 2019-01-01 (×2): qty 1

## 2019-01-01 NOTE — Progress Notes (Signed)
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  This was initially thought to be a pancreatic mass was finally determined to be dissection of the ascending thoracic aorta. Vascular surgery evaluated and recommended conservative management with blood pressure control.  Patient is planned for discharge home today but this morning developed A. fib with RVR with heart rate in the 140s to 150s.  Boluses of metoprolol titrated given along with her usual medication of propafenone and diltiazem.  She is currently on anticoagulation with Eliquis 2.5 mg twice daily.  Heart rate is currently in the 97-120s.   Evaluated patient at bedside this morning.  Patient is anxious to go home however heart rate has been between 97-120. I consulted cardiologist on call Dr. Johnsie Cancel who advised to consult patient's primary cardiologist but however to uptitrate patient's propafenone to 3 times daily. I called patient's primary cardiologist and spoke with the cardiologist on-call.  He advised against discharging patient with a heart rate in the 120s and symptomatic dizziness and low blood pressure.  He stated that patient has never been discharged from their care with heart rate of greater than 100. He advised introduction of beta-blocker and optimize for rate control.  Advised to stay away of diltiazem. Started patient on metoprolol tartrate 50 mg twice daily and have now increased to 75 mg twice daily. Titrate as blood pressure permits. Patient reported episode of dizziness with blood pressure in systolic of 0000000.  Gave 2 doses of albumin.  Assessment & Plan:   Principal  Problem:   Dissection of ascending aorta (HCC) Active Problems:   Hypothyroidism   Aneurysm, 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HCC)   Paroxysmal atrial fibrillation (HCC)   Chronic anticoagulation   Thrombocytosis (HCC)   Dissecting aneurysm of thoracic aorta, Stanford type B ([place])  1.  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 Vascular surgery okay to discharge patient home to follow-up with them in clinic.  2.  Hypothyroidism Continue with levothyroxine  3.  Paroxysmal atrial fibrillation.  Patient developed episode of A. fib with RVR, requiring 2 doses of 5 mg metoprolol IV along with her usual medication of propafenone and diltiazem.   Primary cardiologist advised uptitrating metoprolol as blood pressure permits for rate control.  Avoid diltiazem and continue with propafenone.   Continue with Eliquis Given the patient's tachycardia and elevated white count, patient is meeting SIRS criteria thus we will obtain blood cultures and urine cultures and follow-up on the same We will continue to monitor  4.  Hyponatremia.  Chronic and stable with sodium at 137 Continue to monitor  5.  Thrombocytosis.  Likely secondary to iron deficiency anemia. Continue with iron supplementation and monitor platelet.  6.  Hypotension/fatigue with weakness  Administered Albumin. Monitor vital signs  DVT prophylaxis: Eliquis  Code Status: Full  Family Communication: Discussed with patient Disposition Plan: Pending clinical improvement  Consultants:  Vascular surgery  Procedures: None    Antimicrobials: None    Subjective: Patient continues to endorse some lightheadedness and dizziness but states she has dealt with these symptoms most of her life.  She remains tachycardic into the 110s and 120s after introducing metoprolol  50 mg twice daily.   Objective: Vitals:   01/01/19 0301 01/01/19 0801 01/01/19  0833 01/01/19 1305  BP: 116/68 94/80 (!) 113/91   Pulse: (!) 108 (!) 112    Resp: 20 19 16 16   Temp: 98.4 F (36.9 C) 98.3 F (36.8 C) 98.3 F (36.8 C)   TempSrc: Oral Oral Oral   SpO2: 97% 98% 98% 98%  Weight: 54.5 kg     Height:        Intake/Output Summary (Last 24 hours) at 01/01/2019 1536 Last data filed at 01/01/2019 1046 Gross per 24 hour  Intake 240 ml  Output 200 ml  Net 40 ml   Filed Weights   12/27/18 1835 12/31/18 0353 01/01/19 0301  Weight: 55.8 kg 53.1 kg 54.5 kg    Examination:  General exam: Appears calm and comfortable  Respiratory system: Clear to auscultation. Respiratory effort normal. Cardiovascular system: S1 & S2 heard, tachycardic. No JVD, murmurs, rubs, gallops or clicks. No pedal edema. Gastrointestinal system: Abdomen is nondistended, soft and nontender. No organomegaly or masses felt. Normal bowel sounds heard. Central nervous system: Alert and oriented. No focal neurological deficits. Extremities: Symmetric 5 x 5 power. Skin: No rashes, lesions or ulcers Psychiatry: Judgement and insight appear normal. Mood & affect appropriate.     LOS: 4 days    Time spent: 35 minutes    Charolotte Capuchin, MD [facility]  If 7PM-7AM, please contact night-coverage [email or web address] Password [facility] 01/01/2019, 3:36 PM

## 2019-01-01 NOTE — Progress Notes (Addendum)
Patient with complaints of what she feels/states it is indigestion she says it happens "every time she drinks water" she states it feels like she has to burp but cannot. Maalox was given today for indigestion, patient requests cola at this time caffeene free cola given as requested will monitor patient. Nelda Bucks, Bettina Gavia rN

## 2019-01-01 NOTE — Discharge Instructions (Signed)

## 2019-01-01 NOTE — Care Management Important Message (Signed)
Important Message  Patient Details  Name: Megan Mercer MRN: [id number] Date of Birth: 28-Oct-1935   Medicare Important Message Given:  Yes     Shelda Altes 01/01/2019, 12:32 PM

## 2019-01-01 NOTE — Progress Notes (Signed)
Referring Physician(s): Melvern Banker  Supervising Physician: Daryll Brod  Patient Status:  [name] - In-pt  Chief Complaint: "Depressed" and "Abdominal pain"  Subjective:  Splenic artery pseudoaneurysm s/p attempted endovascular embolization 12/27/2018 by Dr. Pascal Lux complicated by ascending thoracic aortic dissection- procedure was aborted and embolization did not occur. Patient awake and alert sitting in bed eating breakfast. Appears frustrated today- she is "depressed" regarding her stay. States that she is upset that she has been here for 7 days and didn't even have her procedure done. Reassured patient that we are doing everything we can to get her discharged safely- per notes patient's primary cardiologist advises against discharging patient home with HR <100. Complains of intermittent abdominal pain. Rates pain 12/10 at its worst, states Tylenol brings pain down to 7/10.   Allergies: Ace inhibitors, Amoxicillin, Aspirin, Atorvastatin, Crestor  [rosuvastatin calcium], Iodine, Lipitor  [atorvastatin calcium], Oxycodone, Oxycodone-acetaminophen, Paroxetine hcl, Sulfa antibiotics, Sulfasalazine, and Codeine  Medications: Prior to Admission medications   Medication Sig Start Date End Date Taking? Authorizing Provider  apixaban (ELIQUIS) 2.5 MG TABS tablet Take 2.5 mg by mouth 2 (two) times daily.    Yes [provider]  Calcium Carb-Cholecalciferol (CALCIUM-VITAMIN D) 600-400 MG-UNIT TABS Take 1 tablet by mouth daily.   Yes [provider]  colestipol (COLESTID) 1 G tablet Take 2 g by mouth 2 (two) times daily.    Yes [provider]  diltiazem (CARDIZEM SR) 120 MG 12 hr capsule Take 120 mg by mouth daily.  08/26/18  Yes [provider]  ezetimibe (ZETIA) 10 MG tablet Take 10 mg by mouth daily.   Yes [provider]  folic acid (FOLVITE) Q000111Q MCG tablet Take 800 mcg by mouth daily.    Yes [provider]  latanoprost  (XALATAN) 0.005 % ophthalmic solution Place 1 drop into both eyes at bedtime. 08/11/18  Yes [provider]  levothyroxine (SYNTHROID, LEVOTHROID) 75 MCG tablet Take 75 mcg by mouth daily before breakfast.   Yes [provider]  omeprazole (PRILOSEC) 40 MG capsule TAKE 1 CAPSULE TWICE A DAY (NEED OFFICE VISIT FOR FURTHER REFILLS) Patient taking differently: Take 40 mg by mouth daily.  11/13/18  Yes Pyrtle, Lajuan Lines, MD  propafenone (RYTHMOL) 225 MG tablet Take 225 mg by mouth 2 (two) times daily.   Yes [provider]  raloxifene (EVISTA) 60 MG tablet Take 60 mg by mouth at bedtime.    Yes [provider]  timolol (BETIMOL) 0.5 % ophthalmic solution Place 1 drop into both eyes daily.   Yes [provider]     Vital Signs: BP (!) 113/91 (BP Location: Left Arm)   Pulse (!) 112   Temp 98.3 F (36.8 C) (Oral)   Resp 16   Ht 5\' 4"  (1.626 m)   Wt 120 lb 2.4 oz (54.5 kg)   SpO2 98%   BMI 20.62 kg/m   Physical Exam Vitals signs and nursing note reviewed.  Constitutional:      General: She is not in acute distress.    Appearance: Normal appearance.  Cardiovascular:     Rate and Rhythm: Tachycardia present. Rhythm irregular.     Heart sounds: No murmur.  Pulmonary:     Effort: Pulmonary effort is normal. No respiratory distress.  Skin:    General: Skin is warm and dry.  Neurological:     Mental Status: She is alert and oriented to person, place, and time.     Imaging: No  results found.  Labs:  CBC: Recent Labs    12/28/18 0222 12/30/18 0054 12/31/18 0238 01/01/19 0230  WBC 19.7* 17.3* 14.4* 11.7*  HGB 14.3 13.9 13.4 11.8*  HCT 43.1 41.7 40.6 35.6*  PLT 558* 491* 566* 592*    BMP: Recent Labs    12/28/18 0222 12/30/18 0054 12/31/18 0238 01/01/19 0230  NA 137 131* 131* 130*  K 3.8 4.0 4.2 4.0  CL 101 98 102 100  CO2 21* 21* 19* 20*  GLUCOSE 115* 119* 105* 102*  BUN 14 22 19 20   CALCIUM 9.0 8.5* 8.5* 8.5*  CREATININE  0.88 1.05* 0.84 0.85  GFRNONAA >60 50* >60 >60  GFRAA >60 58* >60 >60    LIVER FUNCTION TESTS: Recent Labs    10/21/18 1341 12/30/18 0054 12/31/18 0238 01/01/19 0230  BILITOT 0.9 1.8* 1.7* 1.3*  AST 22 19 14* 17  ALT 17 13 10 13   ALKPHOS 66 45 52 50  PROT 7.4 6.2* 5.8* 6.0*  ALBUMIN 4.1 3.0* 2.6* 3.3*    Assessment and Plan:  Splenic artery pseudoaneurysm s/p attempted endovascular embolization 12/27/2018 by Dr. Pascal Lux complicated by ascending thoracic aortic dissection- procedure was aborted and embolization did not occur. Patient's condition stable- vascular surgery recommending conservative management and D/C with outpatient follow-up with them.  Discussed case with Dr. Pascal Lux- patient is stable from IR standpoint (still with abdominal pains, controlled with PO Tylenol). Recommended discharge instructions per Dr. Pascal Lux: - Continue taking Eliquis 2.5 mg twice daily. - Continue taking Plavix 75 mg once daily x 1 month. - Plan for repeat CT dissection protocol in 1 month- message sent to our schedulers who will facilitate this imaging scan.  Further plans per [facility]- appreciate and agree with management. Per notes, waiting on rate control prior to discharge. IR to follow.   Electronically Signed: Earley Abide, PA-C 01/01/2019, 9:50 AM   I spent a total of 25 Minutes at the the patient's bedside AND on the patient's hospital floor or unit, greater than 50% of which was counseling/coordinating care for splenic artery pseudoaneurysm and ascending thoracic aortic dissection.

## 2019-01-02 LAB — CBC
HCT: 36.5 % (ref 36.0–46.0)
Hemoglobin: 12.3 g/dL (ref 12.0–15.0)
MCH: 30.7 pg (ref 26.0–34.0)
MCHC: 33.7 g/dL (ref 30.0–36.0)
MCV: 91 fL (ref 80.0–100.0)
Platelets: 741 10*3/uL — ABNORMAL HIGH (ref 150–400)
RBC: 4.01 MIL/uL (ref 3.87–5.11)
RDW: 15.3 % (ref 11.5–15.5)
WBC: 12.1 10*3/uL — ABNORMAL HIGH (ref 4.0–10.5)
nRBC: 0 % (ref 0.0–0.2)

## 2019-01-02 LAB — BASIC METABOLIC PANEL
Anion gap: 10 (ref 5–15)
BUN: 16 mg/dL (ref 8–23)
CO2: 21 mmol/L — ABNORMAL LOW (ref 22–32)
Calcium: 8.4 mg/dL — ABNORMAL LOW (ref 8.9–10.3)
Chloride: 98 mmol/L (ref 98–111)
Creatinine, Ser: 0.75 mg/dL (ref 0.44–1.00)
GFR calc Af Amer: >60 mL/min (ref >60)
GFR calc non Af Amer: >60 mL/min (ref >60)
Glucose, Bld: 135 mg/dL — ABNORMAL HIGH (ref 70–99)
Potassium: 4.4 mmol/L (ref 3.5–5.1)
Sodium: 129 mmol/L — ABNORMAL LOW (ref 135–145)

## 2019-01-02 MED ORDER — ALUM & MAG HYDROXIDE-SIMETH 200-200-20 MG/5ML PO SUSP
30.0000 mL | Freq: Once | ORAL | Status: AC
  Administered 2019-01-02: 01:00:00 30 mL via ORAL
  Filled 2019-01-02: qty 30

## 2019-01-02 MED ORDER — SODIUM CHLORIDE 0.9 % IV SOLN
INTRAVENOUS | Status: AC
  Administered 2019-01-02 (×2): via INTRAVENOUS

## 2019-01-02 MED ORDER — METOPROLOL TARTRATE 100 MG PO TABS
100.0000 mg | ORAL_TABLET | Freq: Two times a day (BID) | ORAL | Status: DC
  Administered 2019-01-02 – 2019-01-04 (×4): 100 mg via ORAL
  Filled 2019-01-02 (×4): qty 1

## 2019-01-02 MED ORDER — POLYETHYLENE GLYCOL 3350 17 G PO PACK
17.0000 g | PACK | Freq: Every day | ORAL | Status: DC | PRN
  Administered 2019-01-02: 01:00:00 17 g via ORAL
  Filled 2019-01-02: qty 1

## 2019-01-02 MED ORDER — ALUM & MAG HYDROXIDE-SIMETH 200-200-20 MG/5ML PO SUSP
30.0000 mL | Freq: Once | ORAL | Status: AC
  Administered 2019-01-02: 20:00:00 30 mL via ORAL
  Filled 2019-01-02: qty 30

## 2019-01-02 NOTE — Plan of Care (Signed)
  Problem: Education: Goal: Knowledge of General Education information will improve Description: Including pain rating scale, medication(s)/side effects and non-pharmacologic comfort measures Outcome: Not Progressing   Problem: Health Behavior/Discharge Planning: Goal: Ability to manage health-related needs will improve Outcome: Not Progressing   Problem: Clinical Measurements: Goal: Ability to maintain clinical measurements within normal limits will improve Outcome: Not Progressing Goal: Will remain free from infection Outcome: Not Progressing Goal: Diagnostic test results will improve Outcome: Not Progressing Goal: Respiratory complications will improve Outcome: Not Progressing Goal: Cardiovascular complication will be avoided Outcome: Not Progressing   Problem: Activity: Goal: Risk for activity intolerance will decrease Outcome: Not Progressing   Problem: Nutrition: Goal: Adequate nutrition will be maintained Outcome: Not Progressing   Problem: Coping: Goal: Level of anxiety will decrease Outcome: Not Progressing   Problem: Elimination: Goal: Will not experience complications related to bowel motility Outcome: Not Progressing Goal: Will not experience complications related to urinary retention Outcome: Not Progressing   Problem: Pain Managment: Goal: General experience of comfort will improve Outcome: Not Progressing   Problem: Safety: Goal: Ability to remain free from injury will improve Outcome: Not Progressing   Problem: Skin Integrity: Goal: Risk for impaired skin integrity will decrease Outcome: Not Progressing   Problem: Education: Goal: Knowledge of disease or condition will improve Outcome: Not Progressing Goal: Understanding of medication regimen will improve Outcome: Not Progressing Goal: Individualized Educational Video(s) Outcome: Not Progressing   Problem: Activity: Goal: Ability to tolerate increased activity will improve Outcome: Not  Progressing   Problem: Cardiac: Goal: Ability to achieve and maintain adequate cardiopulmonary perfusion will improve Outcome: Not Progressing   Problem: Health Behavior/Discharge Planning: Goal: Ability to safely manage health-related needs after discharge will improve Outcome: Not Progressing   

## 2019-01-02 NOTE — Progress Notes (Addendum)
Patient up to chair with one assist from nursing staff. Patient tolerated well patient heart rate up to 120 when standing. will monitor patient. Kord Monette, Bettina Gavia RN

## 2019-01-02 NOTE — Progress Notes (Signed)
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  This was initially thought to be a pancreatic mass was finally determined to be dissection of the ascending thoracic aorta. Vascular surgery evaluated and recommended conservative management with blood pressure control.  Patient is planned for discharge home today but this morning developed A. fib with RVR with heart rate in the 140s to 150s.  Boluses of metoprolol titrated given along with her usual medication of propafenone and diltiazem.  She is currently on anticoagulation with Eliquis 2.5 mg twice daily.  Heart rate is currently in the 97-120s.   Evaluated patient at bedside this morning.  Patient is anxious to go home however heart rate has been between 97-120. I consulted cardiologist on call Dr. Johnsie Cancel who advised to consult patient's primary cardiologist but however to uptitrate patient's propafenone to 3 times daily. I called patient's primary cardiologist and spoke with the cardiologist on-call.  He advised against discharging patient with a heart rate in the 120s and symptomatic dizziness and low blood pressure.  He stated that patient has never been discharged from their care with heart rate of greater than 100. He advised introduction of beta-blocker and optimize for rate control.  Advised to stay away of diltiazem. Started patient on metoprolol tartrate 50 mg twice daily and have now increased to 75 mg twice daily. Titrate as blood pressure permits. Patient reported episode of dizziness with blood pressure in systolic of 0000000.  Gave 2 doses of albumin.  Assessment & Plan:   Principal  Problem:   Dissection of ascending aorta (HCC) Active Problems:   Hypothyroidism   Aneurysm, 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HCC)   Paroxysmal atrial fibrillation (HCC)   Chronic anticoagulation   Thrombocytosis (HCC)   Dissecting aneurysm of thoracic aorta, Stanford type B ([place])  1.  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 Vascular surgery okay to discharge patient home to follow-up with them in clinic.  2.  Hypothyroidism Continue with levothyroxine  3.  Paroxysmal atrial fibrillation.  Patient developed episode of A. fib with RVR, requiring 2 doses of 5 mg metoprolol IV along with her usual medication of propafenone and diltiazem.   Primary cardiologist advised uptitrating metoprolol as blood pressure permits for rate control; we will uptitrate again this evening.  Avoid diltiazem and continue with propafenone 3 times daily.   Continue with Eliquis Given the patient's tachycardia and elevated white count, patient is meeting SIRS criteria thus we obtained blood cultures and urine cultures and follow-up on the same We will continue to monitor  4.  Hyponatremia.  Chronic and stable and is likely due to poor p.o. intake in the setting of ongoing acid reflux Start gentle IV fluid hydration and encourage p.o. intake, managing GERD as best as possible although this is a severe and chronic issue Continue to monitor  5.  Thrombocytosis.  Likely secondary to iron deficiency anemia. Continue with iron supplementation and monitor platelet.  6.  Hypotension/fatigue with weakness  Administered Albumin Monitor vital signs  7.  GERD; chronic and severe Continue Protonix and as needed medications, elevate head of the bed  DVT prophylaxis: Eliquis  Code Status: Full  Family Communication: Discussed with patient Disposition Plan: Pending clinical improvement  Consultants:  Vascular surgery   Procedures: None    Antimicrobials: None    Subjective: She states that her GERD is severe and chronic and is not different from what she experiences at home relatively frequently.  She remains tachycardic into the 110s after increasing the metoprolol dose yesterday but this is a mild improvement.  Objective: Vitals:   01/02/19 0445 01/02/19 0649 01/02/19 0733 01/02/19 0832  BP: 117/90 131/87 136/90   Pulse: (!) 113     Resp:      Temp: 97.9 F (36.6 C)   98.2 F (36.8 C)  TempSrc: Oral   Oral  SpO2: 96%     Weight:      Height:        Intake/Output Summary (Last 24 hours) at 01/02/2019 1442 Last data filed at 01/02/2019 1220 Gross per 24 hour  Intake 343 ml  Output 754 ml  Net -411 ml   Filed Weights   12/27/18 1835 12/31/18 0353 01/01/19 0301  Weight: 55.8 kg 53.1 kg 54.5 kg    Examination:  General exam: Appears calm and comfortable  Respiratory system: Clear to auscultation. Respiratory effort normal. Cardiovascular system: S1 & S2 heard, tachycardic. No JVD, murmurs, rubs, gallops or clicks. No pedal edema. Gastrointestinal system: Abdomen is nondistended, soft and nontender. No organomegaly or masses felt. Normal bowel sounds heard. Central nervous system: Alert and oriented. No focal neurological deficits. Extremities: Symmetric 5 x 5 power. Skin: No rashes, lesions or ulcers Psychiatry: Judgement and insight appear normal. Mood & affect appropriate.     LOS: 5 days    Time spent: 35 minutes    Charolotte Capuchin, MD [facility]  If 7PM-7AM, please contact night-coverage [email or web address] Password [email or web address] 01/02/2019, 2:42 PM

## 2019-01-03 DIAGNOSIS — I4891 Unspecified atrial fibrillation: Secondary | ICD-10-CM | POA: Diagnosis not present

## 2019-01-03 DIAGNOSIS — I7101 Dissection of thoracic aorta: Secondary | ICD-10-CM | POA: Diagnosis not present

## 2019-01-03 LAB — BASIC METABOLIC PANEL
Anion gap: 9 (ref 5–15)
BUN: 14 mg/dL (ref 8–23)
CO2: 20 mmol/L — ABNORMAL LOW (ref 22–32)
Calcium: 8.2 mg/dL — ABNORMAL LOW (ref 8.9–10.3)
Chloride: 104 mmol/L (ref 98–111)
Creatinine, Ser: 0.7 mg/dL (ref 0.44–1.00)
GFR calc Af Amer: >60 mL/min (ref >60)
GFR calc non Af Amer: >60 mL/min (ref >60)
Glucose, Bld: 95 mg/dL (ref 70–99)
Potassium: 4.5 mmol/L (ref 3.5–5.1)
Sodium: 133 mmol/L — ABNORMAL LOW (ref 135–145)

## 2019-01-03 LAB — CBC
HCT: 34.5 % — ABNORMAL LOW (ref 36.0–46.0)
Hemoglobin: 11.3 g/dL — ABNORMAL LOW (ref 12.0–15.0)
MCH: 30.2 pg (ref 26.0–34.0)
MCHC: 32.8 g/dL (ref 30.0–36.0)
MCV: 92.2 fL (ref 80.0–100.0)
Platelets: 704 10*3/uL — ABNORMAL HIGH (ref 150–400)
RBC: 3.74 MIL/uL — ABNORMAL LOW (ref 3.87–5.11)
RDW: 15.2 % (ref 11.5–15.5)
WBC: 10.5 10*3/uL (ref 4.0–10.5)
nRBC: 0 % (ref 0.0–0.2)

## 2019-01-03 LAB — URINE CULTURE

## 2019-01-03 LAB — T4, FREE: Free T4: 1.38 ng/dL — ABNORMAL HIGH (ref 0.61–1.12)

## 2019-01-03 MED ORDER — CALCIUM CARBONATE ANTACID 500 MG PO CHEW
2.0000 | CHEWABLE_TABLET | Freq: Once | ORAL | Status: AC
  Administered 2019-01-03: 06:00:00 400 mg via ORAL
  Filled 2019-01-03: qty 2

## 2019-01-03 MED ORDER — FAMOTIDINE 20 MG PO TABS
20.0000 mg | ORAL_TABLET | Freq: Two times a day (BID) | ORAL | Status: DC | PRN
  Administered 2019-01-03: 20:00:00 20 mg via ORAL
  Filled 2019-01-03: qty 1

## 2019-01-03 MED ORDER — ALUM & MAG HYDROXIDE-SIMETH 200-200-20 MG/5ML PO SUSP: 30.0000 mL | Freq: Once | ORAL | Status: DC

## 2019-01-03 NOTE — Progress Notes (Signed)
Patient requesting different medication for reflux. MD/PA on call paged through [facility] system to make aware. Will monitor patient. Amron Guerrette, Bettina Gavia RN

## 2019-01-03 NOTE — Progress Notes (Signed)
Pt's HR is elevated.  Pt refused metoprolol.  She would like to speak with the doctor regarding her plan of care.

## 2019-01-03 NOTE — Consult Note (Addendum)
Cardiology Consultation:   Patient ID: CAMBRY DENG MRN: [id number]; DOB: 1935-11-30  Admit date: 12/27/2018 Date of Consult: 01/03/2019  Primary Care Provider: Clinton Quant, MD Primary Cardiologist: Angelina Sheriff   Patient Profile:   Megan Mercer is a 83 y.o. female with a hx of paroxysmal atrial fibrillation/flutter on Eliquis, Barrett's esophagus, peptic ulcer disease with GERD, hypertension, hyperlipidemia, who is being seen today for the evaluation of atrial fibrillation at the request Dr. Theresa Duty.   Patient has history of atrial fibrillation and tachybradycardia syndrome.  She was on propafenone and had a post cardioversion pause to 3.2-second in 2017. Previously discussed ablation but declined.  History of Present Illness:   Megan Mercer initially presented 11/4 for outpatient treatment of splenic artery pseudoaneurysm by IR.  However she was found to have ascending aortic dissection during procedure (attempted mesenteric arteriogram and embolization)  and procedure was stopped.  Evaluated by vascular surgery and recommended conservative therapy>> outpatient follow-up with repeat CT in 1 month.  In regards to her splenic artery pseudoaneurysm plan to continue Plavix may bring her for repeat mesenteric arteriogram likely via left CFA approach as outpatient in future.  Hospital course complicated by A. fib RVR on 11/6.  She was given IV metoprolol in addition to her home propafenone and diltiazem she was anticoagulated with Eliquis 2.5 mg twice daily.  Patient had transient episode of hypotension on 11/7.  Per note, Primary cardiologist recommended uptitrating metoprolol and avoiding diltiazem with continuation of propafenone.  She is currently on metoprolol 100 mg twice daily (gradually up titrated), propafenone 225 mg 3 times daily (increased from home BID dose on 11/8 ). Home cardizem stopped. HR has been sustained in 120-130s.  Held her Eliquis prior to presentation on  11/4.  Patient denies palpitation, chest pain, shortness of breath or orthopnea.  She reports intermittent abdominal pain.  She also reported lower extremity edema while here but normal on my exam.   Heart Pathway Score:     Past Medical History:  Diagnosis Date  . Anemia   . Anxiety   . Arthritis   . Atrial fibrillation ([place])   . Barrett's esophagus   . Blood transfusion without reported diagnosis   . Complication of anesthesia   . Dysrhythmia   . GERD (gastroesophageal reflux disease)   . Glaucoma   . Hiatal hernia   . HLD (hyperlipidemia)   . HTN (hypertension)   . Hypothyroidism   . Internal hemorrhoids   . Iron deficiency anemia   . Peptic ulcer disease with hemorrhage   . PONV (postoperative nausea and vomiting)    Confusion, Combativeness  . UTI (urinary tract infection)     Past Surgical History:  Procedure Laterality Date  . ABDOMINAL HYSTERECTOMY  1984  . APPENDECTOMY  2007  . CATARACT EXTRACTION Bilateral   . CHOLECYSTECTOMY  2007  . COLONOSCOPY    . ESOPHAGOGASTRODUODENOSCOPY (EGD) WITH PROPOFOL N/A 11/30/2018   Procedure: ESOPHAGOGASTRODUODENOSCOPY (EGD) WITH PROPOFOL;  Surgeon: Milus Banister, MD;  Location: [facility];  Service: Endoscopy;  Laterality: N/A;  . EUS N/A 11/30/2018   Procedure: UPPER ENDOSCOPIC ULTRASOUND (EUS) RADIAL;  Surgeon: Milus Banister, MD;  Location: [facility];  Service: Endoscopy;  Laterality: N/A;  . IR EMBO ARTERIAL NOT HEMORR HEMANG INC GUIDE ROADMAPPING  12/27/2018  . IR RADIOLOGIST EVAL & MGMT  12/14/2018  . IR US GUIDE VASC ACCESS RIGHT  12/27/2018  . UPPER GASTROINTESTINAL ENDOSCOPY       Inpatient Medications: Scheduled  Meds: . alum & mag hydroxide-simeth  30 mL Oral Once  . apixaban  2.5 mg Oral BID  . Chlorhexidine Gluconate Cloth  6 each Topical Daily  . clopidogrel  75 mg Oral Daily  . colestipol  2 g Oral BID  . ezetimibe  10 mg Oral Daily  . latanoprost  1 drop Both Eyes QHS  . levothyroxine  75 mcg Oral  QAC breakfast  . metoprolol tartrate  100 mg Oral BID  . nitrofurantoin (macrocrystal-monohydrate)  100 mg Oral Q12H  . pantoprazole  40 mg Oral Daily  . propafenone  225 mg Oral TID  . raloxifene  60 mg Oral Daily  . sodium chloride flush  3 mL Intravenous Q12H  . sodium chloride flush  3 mL Intravenous Q12H  . timolol  1 drop Both Eyes Daily   Continuous Infusions:  PRN Meds: acetaminophen **OR** acetaminophen, albuterol, hydrALAZINE, metoprolol tartrate, ondansetron, polyethylene glycol, promethazine  Allergies:    Allergies  Allergen Reactions  . Ace Inhibitors Cough  . Amoxicillin Other (See Comments)    Did it involve swelling of the face/tongue/throat, SOB, or low BP? No Did it involve sudden or severe rash/hives, skin peeling, or any reaction on the inside of your mouth or nose? No Did you need to seek medical attention at a hospital or doctor's office? No When did it last happen? If all above answers are "NO", may proceed with cephalosporin use. 3  . Aspirin   . Atorvastatin Other (See Comments)    Other reaction(s): Arthralgia (Joint Pain) Other reaction(s): Arthralgia (Joint Pain)  . Crestor  [Rosuvastatin Calcium]   . Iodine Nausea Only  . Lipitor  [Atorvastatin Calcium]     Other reaction(s): Arthralgia (joint pain)  . Oxycodone Nausea And Vomiting  . Oxycodone-Acetaminophen Nausea And Vomiting  . Paroxetine Hcl Nausea Only    Intolerance= Nausea, Vomitting  . Sulfa Antibiotics Nausea And Vomiting  . Sulfasalazine Nausea And Vomiting  . Codeine Nausea And Vomiting    Social History:   Social History   Socioeconomic History  . Marital status: Single    Spouse name: Not on file  . Number of children: 0  . Years of education: Not on file  . Highest education level: Not on file  Occupational History  . Occupation: RETIRED  Social Needs  . Financial resource strain: Not on file  . Food insecurity    Worry: Not on file    Inability: Not on file   . Transportation needs    Medical: Not on file    Non-medical: Not on file  Tobacco Use  . Smoking status: Never Smoker  . Smokeless tobacco: Never Used  Substance and Sexual Activity  . Alcohol use: Yes    Alcohol/week: 0.0 standard drinks    Comment: rare  . Drug use: No  . Sexual activity: Not on file  Lifestyle  . Physical activity    Days per week: Not on file    Minutes per session: Not on file  . Stress: Not on file  Relationships  . Social Herbalist on phone: Not on file    Gets together: Not on file    Attends religious service: Not on file    Active member of club or organization: Not on file    Attends meetings of clubs or organizations: Not on file    Relationship status: Not on file  . Intimate partner violence    Fear of current or ex  partner: Not on file    Emotionally abused: Not on file    Physically abused: Not on file    Forced sexual activity: Not on file  Other Topics Concern  . Not on file  Social History Narrative  . Not on file    Family History:   Family History  Problem Relation Age of Onset  . Cancer Father        blood cancer, ? type  . Rheum arthritis Father   . Glaucoma Father   . Rheum arthritis Mother   . Arthritis Sister   . Diabetes Sister        TYPE 2  . Heart failure Sister   . Hyperlipidemia Sister   . Colon cancer Neg Hx   . Esophageal cancer Neg Hx   . Pancreatic cancer Neg Hx   . Stomach cancer Neg Hx   . Liver disease Neg Hx      ROS:  Please see the history of present illness.  All other ROS reviewed and negative.     Physical Exam/Data:   Vitals:   01/02/19 2014 01/03/19 0525 01/03/19 0629 01/03/19 0758  BP: (!) 153/97 (!) 143/83 (!) 133/99 131/88  Pulse: (!) 120 (!) 125 (!) 116 (!) 114  Resp: 18     Temp: 98.9 F (37.2 C) 97.8 F (36.6 C)  98.2 F (36.8 C)  TempSrc: Oral Oral  Oral  SpO2: 100% 99% 100% 98%  Weight:      Height:        Intake/Output Summary (Last 24 hours) at 01/03/2019  1403 Last data filed at 01/03/2019 1320 Gross per 24 hour  Intake 331.53 ml  Output 402 ml  Net -70.47 ml   Last 3 Weights 01/01/2019 12/31/2018 12/27/2018  Weight (lbs) 120 lb 2.4 oz 117 lb 1 oz 123 lb 0.3 oz  Weight (kg) 54.5 kg 53.1 kg 55.8 kg     Body mass index is 20.62 kg/m.  General: Thin female in no acute distress HEENT: normal Lymph: no adenopathy Neck: no JVD Endocrine:  No thryomegaly Vascular: No carotid bruits; FA pulses 2+ bilaterally without bruits  Cardiac:  normal S1, S2; irregularly irregular tachycardic, no murmur  Lungs:  clear to auscultation bilaterally, no wheezing, rhonchi or rales  Abd: soft, nontender, no hepatomegaly  Ext: no edema Musculoskeletal:  No deformities, BUE and BLE strength normal and equal Skin: warm and dry  Neuro:  CNs 2-12 intact, no focal abnormalities noted Psych:  Normal affect   EKG:  The EKG was personally reviewed and demonstrates:  EKG 12/27/2018: sinus rhythm at rate of 62 bpm Telemetry:  Telemetry was personally reviewed and demonstrates: Atrial fibrillation/flutter at rate of 120 -130 beats minute  Relevant CV Studies: As above  Laboratory Data:  High Sensitivity Troponin:  No results for input(s): TROPONINIHS in the last 720 hours.   Chemistry Recent Labs  Lab 01/01/19 0230 01/02/19 1038 01/03/19 0214  NA 130* 129* 133*  K 4.0 4.4 4.5  CL 100 98 104  CO2 20* 21* 20*  GLUCOSE 102* 135* 95  BUN 20 16 14   CREATININE 0.85 0.75 0.70  CALCIUM 8.5* 8.4* 8.2*  GFRNONAA >60 >60 >60  GFRAA >60 >60 >60  ANIONGAP 10 10 9     Recent Labs  Lab 12/30/18 0054 12/31/18 0238 01/01/19 0230  PROT 6.2* 5.8* 6.0*  ALBUMIN 3.0* 2.6* 3.3*  AST 19 14* 17  ALT 13 10 13   ALKPHOS 45 52 50  BILITOT 1.8*  1.7* 1.3*   Hematology Recent Labs  Lab 01/01/19 0230 01/02/19 1038 01/03/19 0214  WBC 11.7* 12.1* 10.5  RBC 3.88 4.01 3.74*  HGB 11.8* 12.3 11.3*  HCT 35.6* 36.5 34.5*  MCV 91.8 91.0 92.2  MCH 30.4 30.7 30.2  MCHC  33.1 33.7 32.8  RDW 15.5 15.3 15.2  PLT 592* 741* 704*    Radiology/Studies:  No results found.  Assessment and Plan:   1. Persistent atrial fibrillation/flutter with rapid ventricular rate -Patient has prior history of post cardioversion pause (3.2sec)  in 2017.  Previously declined ablation.  Heart rate persisted in 120-130s range despite up titration of propafenone to 3 times daily and gradual up titration of metoprolol to 100 mg twice daily.  Stopped home cardizem. She is anticoagulated with Eliquis 2.5 mg twice daily however held prior to arrival on 11/4 for schedule procedure.  She was resistant for TEE cardioversion discussion.  TSH elevated to 7.9. Check Free T4.  ? Echo.   2. type B thoracic aortic dissection -The patient was evaluated by vascular surgery -Plan to follow-up as outpatient with repeat CT -Follow strict blood pressure control  3. Splenic artery pseudoaneurysm - Repeat mesenteric arteriogram likely via left CFA approach as outpatient in future.  4. HTN -Elevated intermittently - Target BP of 120/80 -  Consider adding another agent  5. Elevated TSH - per primary - Get free T4  Dr. Rayann Heman to see later today.   For questions or updates, please contact [place] Please consult [email or web address] for contact info under    SignedLeanor Kail, PA  01/03/2019 2:03 PM    I have seen, examined the patient, and reviewed the above assessment and plan.  Changes to above are made where necessary.  On exam, tachycardic irregular rhythm.  She is very frustrated.  She has had prolonged hospitalization with post operative afib.   The patient is hostile during my visit.  As her afib was well controlled with propafenone prior to surgery, I do not think that a medicine change is required.  I would suspect that with cardioversion she may do just fine.    As she did miss 2 days of anticoagulation, I would advise TEE prior to cardioversion.  Unfortunately, she is very  clear that she does not want any additional procedures.  She says that she wants to "go home". As she is minimally symptomatic and V rates are only mildly elevated, I have offered that she could go home to follow-up with her primary cardiologist if she does not wish to have cardioversion here.  She does not need to remain in the hospital if she is resistant to our advice. She is clinically stable with her afib.  For some reason, she suggested to the EP PA that she would like to transfer to [facility].  There is not indication for hospital transfer at this time.  If she wishes to have further care by [facility] for her afib, I would advise discharge with outpatient follow-up by [facility] EP.  If she decides to proceed with TEE guided cardioversion then we could arrange.  Otherwise, OK to discharge with outpatient follow-up.  Electrophysiology team to see as needed while here. Please call with questions.   Co Sign: Thompson Grayer, MD 01/03/2019

## 2019-01-03 NOTE — Progress Notes (Addendum)
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  This was initially thought to be a pancreatic mass was finally determined to be dissection of the ascending thoracic aorta. Vascular surgery evaluated and recommended conservative management with blood pressure control.  Patient is planned for discharge home today but this morning developed A. fib with RVR with heart rate in the 140s to 150s.  Boluses of metoprolol titrated given along with her usual medication of propafenone and diltiazem.  She is currently on anticoagulation with Eliquis 2.5 mg twice daily.  Heart rate is currently in the 97-120s.   Evaluated patient at bedside this morning.  Patient is anxious to go home however heart rate has been between 97-120. I consulted cardiologist on call Dr. Johnsie Cancel who advised to consult patient's primary cardiologist but however to uptitrate patient's propafenone to 3 times daily. We called patient's primary cardiologist and spoke with the cardiologist on-call.  He advised against discharging patient with a heart rate in the 120s and symptomatic dizziness and low blood pressure.  He stated that patient has never been discharged from their care with heart rate of greater than 100. He advised introduction of beta-blocker and optimize for rate control.  Advised to stay away of diltiazem. Started patient on metoprolol tartrate 50 mg twice daily and have now increased to 100 mg twice daily. Titrate as blood pressure permits. Patient reported episode of dizziness with blood pressure in systolic of 0000000.  Gave 2 doses of albumin.  Assessment & Plan:   Principal  Problem:   Dissection of ascending aorta (HCC) Active Problems:   Hypothyroidism   Aneurysm, 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HCC)   Paroxysmal atrial fibrillation (HCC)   Chronic anticoagulation   Thrombocytosis (HCC)   Dissecting aneurysm of thoracic aorta, Stanford type B ([place])  1.  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 Vascular surgery okay to discharge patient home to follow-up with them in clinic.  2.  Hypothyroidism Continue with levothyroxine TSH elevated and will check free T4  3.  Paroxysmal atrial fibrillation.  Patient developed episode of A. fib with RVR, requiring 2 doses of 5 mg metoprolol IV along with her usual medication of propafenone and diltiazem.   Primary cardiologist advised uptitrating metoprolol as blood pressure permits for rate control.  Avoid diltiazem and continue with propafenone 3 times daily.   Continue with Eliquis Consult cardiology  4.  Hyponatremia.  Chronic and stable and is likely due to poor p.o. intake in the setting of ongoing acid reflux. Encourage p.o. intake, managing GERD as best as possible although this is a severe and chronic issue Continue to monitor  5.  Thrombocytosis.  Likely secondary to iron deficiency anemia. Continue with iron supplementation and monitor platelet.  6.  Hypotension/fatigue with weakness  Administered Albumin Monitor vital signs  7.  GERD; chronic and severe Continue Protonix and as needed medications, elevate head of the bed  8.  Possible urinary tract infection Continue p.o. nitrofurantoin  DVT prophylaxis: Eliquis  Code Status: Full  Family Communication: Discussed with patient Disposition Plan: Pending clinical improvement  Consultants:  Vascular surgery  Procedures: None    Antimicrobials: None  Subjective: Patient states that overall she does not feel well today including continued episodes of  acid reflux and intermittent episodes of lightheadedness.  She requests a cardiology consult.  Objective: Vitals:   01/03/19 0525 01/03/19 0629 01/03/19 0758 01/03/19 1433  BP: (!) 143/83 (!) 133/99 131/88 (!) 145/101  Pulse: (!) 125 (!) 116 (!) 114   Resp:      Temp: 97.8 F (36.6 C)  98.2 F (36.8 C)   TempSrc: Oral  Oral   SpO2: 99% 100% 98%   Weight:      Height:        Intake/Output Summary (Last 24 hours) at 01/03/2019 1517 Last data filed at 01/03/2019 1320 Gross per 24 hour  Intake 331.53 ml  Output 402 ml  Net -70.47 ml   Filed Weights   12/27/18 1835 12/31/18 0353 01/01/19 0301  Weight: 55.8 kg 53.1 kg 54.5 kg    Examination:  General exam: Appears calm and but in mild distress Respiratory system: Clear to auscultation. Respiratory effort normal. Cardiovascular system: Irregularly irregular, tachycardic. No JVD, murmurs, rubs, gallops or clicks. No pedal edema. Gastrointestinal system: Abdomen is nondistended, soft and nontender. No organomegaly or masses felt. Normal bowel sounds heard. Central nervous system: Alert and oriented. No focal neurological deficits. Extremities: Symmetric 5 x 5 power. Skin: No rashes, lesions or ulcers Psychiatry: Judgement and insight appear normal. Mood & affect appropriate.     LOS: 6 days    Time spent: 35 minutes    Charolotte Capuchin, MD [facility]  If 7PM-7AM, please contact night-coverage [email or web address] Password [email or web address] 01/03/2019, 3:17 PM

## 2019-01-03 NOTE — Progress Notes (Addendum)
Patient adamant about speaking with a cardiologist. Patient attending Theresa Duty MD, paged through [facility] system to make aware. Will monitor patient .Dreyton Roessner, Bettina Gavia RN

## 2019-01-03 NOTE — Progress Notes (Signed)
Patient nauseated, zofran given as ordered as needed for nausea. Mckinley Olheiser, Bettina Gavia RN

## 2019-01-03 NOTE — Plan of Care (Signed)
  Problem: Education: Goal: Knowledge of General Education information will improve Description: Including pain rating scale, medication(s)/side effects and non-pharmacologic comfort measures Outcome: Not Progressing   Problem: Health Behavior/Discharge Planning: Goal: Ability to manage health-related needs will improve Outcome: Not Progressing   Problem: Clinical Measurements: Goal: Ability to maintain clinical measurements within normal limits will improve Outcome: Not Progressing Goal: Will remain free from infection Outcome: Not Progressing Goal: Diagnostic test results will improve Outcome: Not Progressing Goal: Respiratory complications will improve Outcome: Not Progressing Goal: Cardiovascular complication will be avoided Outcome: Not Progressing   Problem: Activity: Goal: Risk for activity intolerance will decrease Outcome: Not Progressing   Problem: Nutrition: Goal: Adequate nutrition will be maintained Outcome: Not Progressing   Problem: Coping: Goal: Level of anxiety will decrease Outcome: Not Progressing   Problem: Elimination: Goal: Will not experience complications related to bowel motility Outcome: Not Progressing Goal: Will not experience complications related to urinary retention Outcome: Not Progressing   Problem: Pain Managment: Goal: General experience of comfort will improve Outcome: Not Progressing   Problem: Safety: Goal: Ability to remain free from injury will improve Outcome: Not Progressing   Problem: Skin Integrity: Goal: Risk for impaired skin integrity will decrease Outcome: Not Progressing   Problem: Education: Goal: Knowledge of disease or condition will improve Outcome: Not Progressing Goal: Understanding of medication regimen will improve Outcome: Not Progressing Goal: Individualized Educational Video(s) Outcome: Not Progressing   Problem: Activity: Goal: Ability to tolerate increased activity will improve Outcome: Not  Progressing   Problem: Cardiac: Goal: Ability to achieve and maintain adequate cardiopulmonary perfusion will improve Outcome: Not Progressing   Problem: Health Behavior/Discharge Planning: Goal: Ability to safely manage health-related needs after discharge will improve Outcome: Not Progressing   

## 2019-01-03 NOTE — Progress Notes (Signed)
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 Pascal Lux  Subjective:  Megan Mercer was seen today by myself and Dr. Pascal Lux - she reports feeling depressed and frustrated because she has been in the hospital for so long. She states that she does not feel like anyone is doing anything for her heart besides "increasing and decreasing my medicine" and she is considering requesting a transfer to [facility]. She states that she has had worsening indigestion during this hospital stay as well as lower abdominal cramping. She states she has had GI symptoms similar to these for many years but feels they have worsened since she has been here. She also complains of chronic upper and lower back pain which has been present off and on for several years and has worsened due to being on bedrest currently.   It was thoroughly discussed with patient today what occurred during the procedure, why she is still in the hospital and what the future plan is from IR perspective.  Allergies: Ace inhibitors, Amoxicillin, Aspirin, Atorvastatin, Crestor  [rosuvastatin calcium], Iodine, Lipitor  [atorvastatin calcium], Oxycodone, Oxycodone-acetaminophen, Paroxetine hcl, Sulfa antibiotics, Sulfasalazine, and Codeine  Medications: Prior to Admission medications   Medication Sig Start Date End Date Taking? Authorizing Provider  apixaban (ELIQUIS) 2.5 MG TABS tablet Take 2.5 mg by mouth 2 (two) times daily.    Yes [provider]  Calcium Carb-Cholecalciferol (CALCIUM-VITAMIN D) 600-400 MG-UNIT TABS Take 1 tablet by mouth daily.   Yes [provider]  colestipol (COLESTID) 1 G tablet Take 2 g by mouth 2 (two) times daily.    Yes [provider]  diltiazem (CARDIZEM SR) 120 MG 12 hr capsule Take 120 mg by mouth daily.  08/26/18  Yes [provider]  ezetimibe  (ZETIA) 10 MG tablet Take 10 mg by mouth daily.   Yes [provider]  folic acid (FOLVITE) Q000111Q MCG tablet Take 800 mcg by mouth daily.    Yes [provider]  latanoprost (XALATAN) 0.005 % ophthalmic solution Place 1 drop into both eyes at bedtime. 08/11/18  Yes [provider]  levothyroxine (SYNTHROID, LEVOTHROID) 75 MCG tablet Take 75 mcg by mouth daily before breakfast.   Yes [provider]  omeprazole (PRILOSEC) 40 MG capsule TAKE 1 CAPSULE TWICE A DAY (NEED OFFICE VISIT FOR FURTHER REFILLS) Patient taking differently: Take 40 mg by mouth daily.  11/13/18  Yes Pyrtle, Lajuan Lines, MD  propafenone (RYTHMOL) 225 MG tablet Take 225 mg by mouth 2 (two) times daily.   Yes [provider]  raloxifene (EVISTA) 60 MG tablet Take 60 mg by mouth at bedtime.    Yes [provider]  timolol (BETIMOL) 0.5 % ophthalmic solution Place 1 drop into both eyes daily.   Yes [provider]     Vital Signs: BP 131/88   Pulse (!) 114   Temp 98.2 F (36.8 C) (Oral)   Resp 18   Ht 5\' 4"  (1.626 m)   Wt 120 lb 2.4 oz (54.5 kg)   SpO2 98%   BMI 20.62 kg/m   Physical Exam Vitals signs and nursing note reviewed.  Constitutional:      General: She is not in acute distress. HENT:     Head: Normocephalic.  Cardiovascular:     Rate and Rhythm: Tachycardia present.  Pulmonary:     Effort: Pulmonary effort is normal.  Skin:    General: Skin is warm and dry.  Neurological:     Mental Status: She is alert. Mental status is at baseline.  Psychiatric:        Mood and Affect: Mood normal.        Behavior: Behavior normal.        Thought Content: Thought content normal.        Judgment: Judgment normal.     Imaging: No results found.  Labs:  CBC: Recent Labs    12/31/18 0238 01/01/19 0230 01/02/19 1038 01/03/19 0214  WBC 14.4* 11.7* 12.1* 10.5  HGB 13.4 11.8* 12.3 11.3*  HCT 40.6 35.6* 36.5 34.5*  PLT 566* 592* 741* 704*    COAGS:  No results for input(s): INR, APTT in the last 8760 hours.  BMP: Recent Labs    12/31/18 0238 01/01/19 0230 01/02/19 1038 01/03/19 0214  NA 131* 130* 129* 133*  K 4.2 4.0 4.4 4.5  CL 102 100 98 104  CO2 19* 20* 21* 20*  GLUCOSE 105* 102* 135* 95  BUN 19 20 16 14   CALCIUM 8.5* 8.5* 8.4* 8.2*  CREATININE 0.84 0.85 0.75 0.70  GFRNONAA >60 >60 >60 >60  GFRAA >60 >60 >60 >60    LIVER FUNCTION TESTS: Recent Labs    10/21/18 1341 12/30/18 0054 12/31/18 0238 01/01/19 0230  BILITOT 0.9 1.8* 1.7* 1.3*  AST 22 19 14* 17  ALT 17 13 10 13   ALKPHOS 66 45 52 50  PROT 7.4 6.2* 5.8* 6.0*  ALBUMIN 4.1 3.0* 2.6* 3.3*    Assessment and Plan:  83 y/o F currently admitted for thoracic aortic dissection during attempted splenic artery pseudoaneurysm embolization on 12/27/18 with Dr. Pascal Lux. She remains admitted due to persistent tachycardia - during exam today HR sustained ~120s.  Thorough discussion today between [name] and Dr. Pascal Lux regarding what occurred during the procedure, why the procedure was aborted, why she remains hospitalized and what our future plans are from an IR prospective. Patient states understanding of all of the above and although she is frustrated with her prolonged hospital stay she understands that she is still here for her safety. She states she wishes to discuss her care plan with her cardiologist/hospitalist today and after that she will decide if she would like to be transferred to [facility].   From IR perspective current plan is: - Discharge to home when safe from TRH/cardiology perspective - Continue Eliquis 2.5 mg BID - Continue Plavix 75 mg QD x 1 month - Repeat CT dissection protocol in 1 month to assess for resolution of dissection - After above, discuss proceeding with splenic artery pseudoaneurysm embolization from LLE approach if patient is stable and wishes to pursue the procedure.   IR will continue to follow along - discharge plans per primary  service.  Please call with questions or concerns.   Electronically Signed: Joaquim Nam, PA-C 01/03/2019, 10:24 AM   I spent a total of 15 Minutes at the the patient's bedside AND on the patient's hospital floor or unit, greater than 50% of which was counseling/coordinating care for follow up thoracic aortic dissection following attempted splenic pseudoaneurysm embolization.

## 2019-01-04 ENCOUNTER — Other Ambulatory Visit: Payer: Self-pay

## 2019-01-04 LAB — CBC
HCT: 34.4 % — ABNORMAL LOW (ref 36.0–46.0)
Hemoglobin: 11.4 g/dL — ABNORMAL LOW (ref 12.0–15.0)
MCH: 30.2 pg (ref 26.0–34.0)
MCHC: 33.1 g/dL (ref 30.0–36.0)
MCV: 91.2 fL (ref 80.0–100.0)
Platelets: 829 10*3/uL — ABNORMAL HIGH (ref 150–400)
RBC: 3.77 MIL/uL — ABNORMAL LOW (ref 3.87–5.11)
RDW: 15.1 % (ref 11.5–15.5)
WBC: 9.5 10*3/uL (ref 4.0–10.5)
nRBC: 0 % (ref 0.0–0.2)

## 2019-01-04 LAB — BASIC METABOLIC PANEL
Anion gap: 9 (ref 5–15)
BUN: 12 mg/dL (ref 8–23)
CO2: 20 mmol/L — ABNORMAL LOW (ref 22–32)
Calcium: 8.5 mg/dL — ABNORMAL LOW (ref 8.9–10.3)
Chloride: 102 mmol/L (ref 98–111)
Creatinine, Ser: 0.66 mg/dL (ref 0.44–1.00)
GFR calc Af Amer: >60 mL/min (ref >60)
GFR calc non Af Amer: >60 mL/min (ref >60)
Glucose, Bld: 93 mg/dL (ref 70–99)
Potassium: 4.2 mmol/L (ref 3.5–5.1)
Sodium: 131 mmol/L — ABNORMAL LOW (ref 135–145)

## 2019-01-04 MED ORDER — METOPROLOL TARTRATE 100 MG PO TABS: 100.0000 mg | ORAL_TABLET | Freq: Two times a day (BID) | ORAL | 0 refills | Status: DC

## 2019-01-04 MED ORDER — PROPAFENONE HCL 225 MG PO TABS: 225.0000 mg | ORAL_TABLET | Freq: Three times a day (TID) | ORAL | 0 refills | Status: AC

## 2019-01-04 MED ORDER — CLOPIDOGREL BISULFATE 75 MG PO TABS: 75.0000 mg | ORAL_TABLET | Freq: Every day | ORAL | 0 refills | Status: DC

## 2019-01-04 NOTE — Progress Notes (Addendum)
Patient ambulating with contact guard assist in hallway.  Ambulated 100 ft on r/a.  HR fluctuated from 102-129 (Atrial flutter).  Patient denied symptoms save for "slight" dizziness, which she states was not unusual for her.

## 2019-01-04 NOTE — Progress Notes (Signed)
Patient discharged to home residence via private vehicle driven by friend.  Escorted to exit via wheelchair by nurse tech.  She stated that she wanted some metoprolol to take with her, and I explained to her that she could stop by the pharmacy to pick up her medication.  She stated, "That's all right, they'll mail it to me".  I did tell her that we may be able to get her a few doses so she would not miss any of them while she waited for her medications to be mailed from her home pharmacy.  She stated, "No, They know me and [place], so I know I'll get my medications by tomorrow."  I urged patient not to skip any doses of her medications in lieu of potential side effects.  She waved her hand at me and said, "I'll be ok".

## 2019-01-04 NOTE — Discharge Summary (Signed)
Physician Discharge Summary  TAYJAH LOBDELL OEV:[id number] DOB: 1935/10/25 DOA: 12/27/2018  PCP: Clinton Quant, MD  Admit date: 12/27/2018 Discharge date: 01/04/2019  Admitted From: Home Disposition: Home  Recommendations for Outpatient Follow-up:  1. Follow up with PCP in 1-2 weeks 2. 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  This was initially thought to be a pancreatic mass was finally determined to be dissection of the ascending thoracic aorta. Vascular surgery evaluated and recommended conservative management with blood pressure control.  On the morning that the patient was planned to be discharged home she developed A. fib with RVR with heart rate in the 140s to 150s.  Boluses of metoprolol titrated given along with her usual medication of propafenone and diltiazem.  She is currently on anticoagulation with Eliquis 2.5 mg twice daily.  After speaking with the patient's primary cardiologist over the phone he recommended discontinuing the diltiazem and starting metoprolol and octet titrating for effect.  He also instructed [place] to increase the propafenone from twice daily to 3 times daily dosing.  These measures only marginally improved the patient's heart rate down into the 120s at rest.  For this reason cardiology at [facility] was consulted and the patient continued to refuse the idea of cardioversion.  Given all this and the fact that the patient's symptoms had improved at least cardiology felt that the patient was stable for discharge home  with follow-up with her primary cardiologist or she has the option of returning to our cardiologist for possible cardioversion down the road.  She will also have follow-up with interventional radiology for her splenic artery pseudoaneurysm.  Discharge Diagnoses:  Principal Problem:   Dissection of ascending aorta (HCC) Active Problems:   Hypothyroidism   Aneurysm, 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HCC)   Paroxysmal atrial fibrillation (HCC)   Chronic anticoagulation   Thrombocytosis (HCC)   Dissecting aneurysm of thoracic aorta, [place] type B [facility])    Discharge Instructions  Discharge Instructions    Diet - low sodium heart healthy   Complete by: As directed    Increase activity slowly   Complete by: As directed    Schedule Transitional Care Management Visit   Complete by: As directed    With PCP within 1-2 weeks     Allergies as of 01/04/2019      Reactions   Ace Inhibitors Cough   Amoxicillin Other (See Comments)   Did it involve swelling of the face/tongue/throat, SOB, or low BP? No Did it involve sudden or severe rash/hives, skin peeling, or any reaction on the inside of your mouth or nose? No Did you need to seek medical attention at a hospital or doctor's office? No When did it last happen? If all above answers are "NO", may proceed with cephalosporin use. 3   Aspirin    Atorvastatin Other (See Comments)   Other reaction(s): Arthralgia (Joint Pain) Other reaction(s): Arthralgia (Joint Pain)   Crestor  [rosuvastatin Calcium]    Iodine Nausea Only   Lipitor  [atorvastatin Calcium]    Other reaction(s): Arthralgia (joint pain)   Oxycodone Nausea And Vomiting   Oxycodone-acetaminophen Nausea And Vomiting   Paroxetine Hcl Nausea Only  Intolerance= Nausea, Vomitting   Sulfa Antibiotics Nausea And Vomiting   Sulfasalazine Nausea And Vomiting   Codeine Nausea And Vomiting      Medication List    STOP taking these medications   diltiazem 120 MG 12 hr capsule Commonly  known as: CARDIZEM SR     TAKE these medications   apixaban 2.5 MG Tabs tablet Commonly known as: ELIQUIS Take 2.5 mg by mouth 2 (two) times daily.   Calcium-Vitamin D 600-400 MG-UNIT Tabs Take 1 tablet by mouth daily.   clopidogrel 75 MG tablet Commonly known as: PLAVIX Take 1 tablet (75 mg total) by mouth daily. Start taking on: January 05, 2019   colestipol 1 g tablet Commonly known as: COLESTID Take 2 g by mouth 2 (two) times daily.   ezetimibe 10 MG tablet Commonly known as: ZETIA Take 10 mg by mouth daily.   folic acid 314 MCG tablet Commonly known as: FOLVITE Take 800 mcg by mouth daily.   latanoprost 0.005 % ophthalmic solution Commonly known as: XALATAN Place 1 drop into both eyes at bedtime.   levothyroxine 75 MCG tablet Commonly known as: SYNTHROID Take 75 mcg by mouth daily before breakfast.   metoprolol tartrate 100 MG tablet Commonly known as: LOPRESSOR Take 1 tablet (100 mg total) by mouth 2 (two) times daily.   omeprazole 40 MG capsule Commonly known as: PRILOSEC TAKE 1 CAPSULE TWICE A DAY (NEED OFFICE VISIT FOR FURTHER REFILLS) What changed: See the new instructions.   propafenone 225 MG tablet Commonly known as: RYTHMOL Take 1 tablet (225 mg total) by mouth every 8 (eight) hours. What changed: when to take this   raloxifene 60 MG tablet Commonly known as: EVISTA Take 60 mg by mouth at bedtime.   timolol 0.5 % ophthalmic solution Commonly known as: BETIMOL Place 1 drop into both eyes daily.       Allergies  Allergen Reactions  . Ace Inhibitors Cough  . Amoxicillin Other (See Comments)    Did it involve swelling of the face/tongue/throat, SOB, or low BP? No Did it involve sudden or severe rash/hives, skin peeling, or any reaction on the inside of your mouth or nose? No Did you need to seek medical attention at a hospital or doctor's office? No When did it last happen? If all above answers are "NO", may proceed with  cephalosporin use. 3  . Aspirin   . Atorvastatin Other (See Comments)    Other reaction(s): Arthralgia (Joint Pain) Other reaction(s): Arthralgia (Joint Pain)  . Crestor  [Rosuvastatin Calcium]   . Iodine Nausea Only  . Lipitor  [Atorvastatin Calcium]     Other reaction(s): Arthralgia (joint pain)  . Oxycodone Nausea And Vomiting  . Oxycodone-Acetaminophen Nausea And Vomiting  . Paroxetine Hcl Nausea Only    Intolerance= Nausea, Vomitting  . Sulfa Antibiotics Nausea And Vomiting  . Sulfasalazine Nausea And Vomiting  . Codeine Nausea And Vomiting    Consultations:  Interventional radiology  Vascular surgery  Cardiology   Procedures/Studies: Ir US Guide Vasc Access Right  Result Date: 12/27/2018 INDICATION: Enlarging splenic artery pseudoaneurysm. Patient presents today for mesenteric arteriogram and attempted percutaneous embolization. Please refer to formal consultation in the epic EMR dated 12/14/2018 for additional details. EXAM: Uterine Fibroid Embolization MEDICATIONS: None ANESTHESIA/SEDATION: Moderate (conscious) sedation was employed during this procedure utilizing intravenous versed and Fentanyl. Moderate Sedation Time: 72 minutes. The patient's level of consciousness and vital signs were monitored continuously by radiology nursing throughout the procedure under my direct supervision.  CONTRAST:  62m OMNIPAQUE IOHEXOL 300 MG/ML  SOLN FLUOROSCOPY TIME:  6 minutes, 48 seconds (137.0mGy) COMPLICATIONS: SIR LEVEL C - Requires therapy, minor hospitalization (<48 hrs). Access site complication resulting in a type B thoracic aortic dissection/intramural hematoma. PROCEDURE: Informed consent was obtained from the patient following explanation of the procedure, risks, benefits and alternatives. The patient understands, agrees and consents for the procedure. All questions were addressed. A time out was performed prior to the initiation of the procedure. Maximal barrier sterile technique  utilized including caps, mask, sterile gowns, sterile gloves, large sterile drape, hand hygiene, and Betadine prep. The right femoral head was marked fluoroscopically. Under sterile conditions and local anesthesia, the right common femoral artery access was performed with a micropuncture needle. Under direct ultrasound guidance, the right common femoral was accessed with a micropuncture kit. An ultrasound image was saved for documentation purposes. Note was made of difficulty advancing the Bentson wire through the pelvic arterial system, presumably secondary to vessel tortuosity and as such, a stiff glidewire was advanced to the level of the abdominal aorta. Given marked tortuosity of the pelvic vasculature and abdominal aortic decision was made to proceed with placement of a 6 French, 35 cm radiopaque tip vascular sheath in hopes of maintaining a more durable accessed regional to the celiac artery origin. As such, the 6 French radiopaque sheath was advanced over the stiff glidewire under fluoroscopic guidance without difficulty. Blood flow was noted to easily aspirate from the side arm sheath. As such, again over a stiff Glidewire a Mickelson catheter was advanced to the level of the thoracic aorta where it was formed however note was made of difficulty aspirating from the catheter. Limited contrast injection demonstrated a subintimal positioning of the Mickelson catheter. At this point, limited contrast injection was performed via the side arm of the vascular sheath also demonstrating subintimal positioning. Given concern for creation of a dissection, the on-call vascular surgeon (Dr. EDonnetta Hutching was made aware. The 35 cm vascular sheath was retracted to the level of the right external iliac artery with contrast injection demonstrating appropriate intraluminal positioning. Attempts were made with a Kumpe catheter to cannulate the true lumen of the right common iliac artery however as a true lumen was not definitively  identified, the decision was made to abort the procedure. The 35 cm, 6 French vascular sheath was then exchanged for a standard 7 cm 6 [place] sheath in case additional emergent percutaneous intervention was required. The vascular sheath was secured at the entrance site within interrupted suture and connected to a pressure bag. A dressing was applied. Note, the patient complained of expected chest and back pain with associated hypertension and tachycardia though the symptoms largely defervesced with administration of conscious sedation and remained otherwise hemodynamically stable. The patient was then escorted to CT to undergo a multiphase CT the chest, abdomen and pelvis. FINDINGS: Given difficulty advancing the initial Bentson access wire to the through the level of the pelvic arterial system initially thought to be secondary to vessel tortuosity, a stiff glidewire was advanced through the pelvic arterial system to the level of the abdominal aorta. Unfortunately, the cephalad wire was in a subintimal location and at the placement of the longer vascular sheath (selected in hopes of obtaining a more durable access IMPRESSION: Unsuccessful mesenteric arteriogram secondary to arterial access complication and development of an aortic dissection/hematoma likely originating at the confluence of the right common and external iliac arteries due to unstable plaque. PLAN: - Subsequent multiphase CT scan  of the chest, abdomen and pelvis performed immediately following the above procedure confirmed concern for a type B thoracic aortic dissection/intramural hematoma which results in approximately 50% luminal narrowing of the right artery though the remaining branch vessels of the abdominal aorta remain patent without a hemodynamically significant narrowing. There is minimal opacification of the false lumen of the dissection at the level of the distal aspect of thoracic aorta, though otherwise there is no definitive  opacification of the false lumen and there is no evidence of contrast extravasation. - The patient will be admitted for blood pressure, pain control and definitive vascular surgery consultation - Once deemed appropriate by the admitting service, the patient will be transitioned back to her outpatient anticoagulation as well as will likely be initiated on platelet inhibition therapy, likely Plavix (patient has history of aspirin allergy). - Ultimately, I will likely repeat a multiphase CT of the chest, abdomen and pelvis in approximately 4-6 weeks to not only evaluate the dissection but also for procedural planning purposes for still untreated splenic artery pseudoaneurysm. Electronically Signed   By: Sandi Mariscal M.D.   On: 12/27/2018 17:37   Ct Angio Chest/abd/pel For Dissection W And/or W/wo  Result Date: 12/27/2018 CLINICAL DATA:  Concern for creation of a false lumen/aortic dissection during arterial access of planned splenic artery embolization. History of pulmonary nodules. EXAM: CT ANGIOGRAPHY CHEST, ABDOMEN AND PELVIS TECHNIQUE: Multidetector CT imaging through the chest, abdomen and pelvis was performed using the standard protocol during bolus administration of intravenous contrast. Multiplanar reconstructed images and MIPs were obtained and reviewed to evaluate the vascular anatomy. CONTRAST:  16m OMNIPAQUE IOHEXOL 350 MG/ML SOLN COMPARISON:  CT abdomen and pelvis-12/07/2018; 08/28/2025 FINDINGS: CTA CHEST FINDINGS Vascular Findings: Review of the precontrast images demonstrates an intramural hematoma beginning at the level of the posterior aspect of the aortic arch, peripheral to the takeoff of the left subclavian artery (sagittal image 83, series 17), extending throughout the descending thoracic aorta and abdominal aorta to the level of the right common and external iliac arteries. High-density contrast material is seen within the false lumen as a sequela of subintimal contrast administration during  preceding attempted mesenteric arteriogram. There is a minimal amount of opacification of the intramural hematoma/false lumen of the dissection at the level of the distal aspect of the descending thoracic aorta (image 81, series 8), however otherwise there is non opacification of the intramural hematoma. Minimal amount of expected periaortic stranding. No contrast extravasation. Normal caliber the thoracic aorta with measurements as follows. The branch vessels of the aortic arch appear patent throughout their imaged courses. Borderline cardiomegaly.  No pericardial effusion. Although this examination was not tailored for the evaluation the pulmonary arteries, there are no discrete filling defects within the central pulmonary arterial tree to suggest central pulmonary embolism. Normal caliber of the main pulmonary artery. ------------------------------------------------------------- Thoracic aortic measurements: Sinotubular junction 32 mm as measured in greatest oblique short axis coronal dimension. Proximal ascending aorta 32 mm as measured in greatest oblique short axis axial dimension at the level of the main pulmonary artery and 33 mm in greatest oblique short axis coronal diameter (coronal image 38, series 16). Aortic arch aorta 35 mm as measured in greatest oblique short axis sagittal dimension (sagittal image 85, series 17) and approximately 34 mm in greatest oblique short axis coronal diameter (coronal image 61, series 16). Proximal descending thoracic aorta 29 mm as measured in greatest oblique short axis axial dimension at the level of the main pulmonary artery. Distal descending  thoracic aorta 27 mm as measured in greatest oblique short axis axial dimension at the level of the diaphragmatic hiatus. Review of the MIP images confirms the above findings. ------------------------------------------------------------- Non-Vascular Findings: Mediastinum/Lymph Nodes: No bulky mediastinal, hilar or axillary  lymphadenopathy. Lungs/Pleura: Scattered pulmonary nodules are seen bilaterally some of which demonstrate a tree-in-bud configuration (representative images 27 and 28, series 5). Dominant right upper lobe pulmonary nodule measures 0.6 cm in diameter (image 28, series 5 while dominant left upper lobe pulmonary nodule measures 0.4 cm (image 26, series 5). Minimal dependent subsegmental atelectasis. No discrete focal airspace opacities. No pleural effusion or pneumothorax. The central pulmonary airways appear widely patent. Musculoskeletal: No acute or aggressive osseous abnormalities. Mild-to-moderate multilevel DDD within midthoracic spine with associated mildly accentuated kyphosis. Regional soft tissues appear normal. Normal appearance of the thyroid gland. _________________________________________________________ _________________________________________________________ CTA ABDOMEN AND PELVIS FINDINGS VASCULAR Aorta: As above, there is extension of the intramural hematoma throughout the abdominal aorta to the level of the right common and external iliac arteries. Review of the precontrast images demonstrates high-density contrast material scattered within the false lumen of the abdominal aortic dissection, the origin and proximal aspect of the right renal artery as well as the right external iliac artery as a sequela of subintimal contrast administration during preceding attempted mesenteric arteriogram. No contrast extravasation or significant periaortic stranding. Scattered atherosclerotic plaque throughout the normal caliber abdominal aorta, not resulting in a hemodynamically significant stenosis. Celiac: Widely patent without a hemodynamically significant narrowing. A replaced right hepatic artery is noted to arise from the proximal SMA. The splenic artery is again noted to be markedly tortuous and supplies a grossly unchanged approximately 4.7 x 3.9 x 4.7 cm pseudoaneurysm arising from a cranial division of  the splenic artery, grossly unchanged compared to the 12/07/2018 examination, previously, 4.4 x 3.5 x 4.9 cm. Redemonstrated moderate to large amount of mural thrombus within the dominant component of the aneurysm. No definitive perianeurysmal stranding. SMA: Widely patent without hemodynamically significant narrowing. A replaced right hepatic artery is noted to arise from the proximal SMA. The distal tributaries of the SMA appear widely patent without discrete intraluminal filling defect to suggest distal embolism. Renals: Solitary bilaterally; review of the precontrast images demonstrates high density contrast material involving the origin and proximal aspects of the right renal artery as a sequela of subintimal contrast administration during attempted mesenteric arteriogram. The intramural hematoma results in suspected approximately 50% luminal narrowing of the right renal artery though the vessel remains patent without evidence of distal embolism. The left renal artery appears widely patent without hemodynamically significant narrowing. IMA: Disease at its origin though remains patent. Additionally, there is proximal collateral supply from the SMA. Inflow: Review of the precontrast images demonstrates high contrast material involving the right common iliac artery as a sequela of subintimal contrast administration during preceding attempted mesenteric arteriogram. There is a non flow limiting short-segment dissection involving the right common (image 141, series 8) and external iliac arteries (image 146, series 8). There is a moderate amount of eccentric mixed calcified and noncalcified atherosclerotic plaque involving the bilateral common iliac arteries, not resulting in a hemodynamically significant stenosis. The bilateral internal iliac arteries are diseased though patent of normal caliber. The bilateral external iliac arteries are tortuous though widely patent without a hemodynamically significant narrowing.  The arterial sheath enters the proximal most aspect of the right superficial femoral artery (image 175, series 8). The bilateral common femoral and imaged portions of the bilateral superficial and deep femoral arteries appear  widely patent throughout their imaged courses. Veins: The IVC and pelvic venous system appear widely on this arterial phase examination. Review of the MIP images confirms the above findings. _________________________________________________________ NON-VASCULAR Hepatobiliary: Normal hepatic contour. No discrete hepatic lesions. Post cholecystectomy with persistent dilatation of the CBD and intrahepatic biliary system, presumably the sequela of biliary reservoir phenomena. No ascites. Pancreas: There is persistent dilatation of the central aspect the pancreatic duct, similar to the 08/2015 examination and without definitive pancreatic lesion on this non pancreatic protocol CT scan. No discrete peripancreatic stranding. 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 No perisplenic stranding. Adrenals/Urinary Tract: Precontrast images demonstrate subintimal contrast involving the origin and proximal aspects of the right renal artery with associated right renal nephrogram however fortunately arterial and delayed venous phase imaging demonstrates symmetric enhancement and excretion of the bilateral kidneys with patency of the right renal artery. Note is made of a punctate (approximately 0.2 cm) nonobstructing right-sided renal stone. No evidence of left-sided nephrolithiasis. Note is made of an approximately 4.0 cm hypoattenuating right-sided renal cyst. No evidence urinary obstruction or perinephric stranding. Normal appearance the bilateral adrenal glands. Excreted contrast is seen within otherwise normal-appearing urinary bladder. Stomach/Bowel: Moderate to large colonic stool burden without evidence  of enteric obstruction. No pneumoperitoneum, pneumatosis or portal venous gas. Normal appearance of the terminal ileum. The appendix is not definitively identified however there is no pericecal inflammatory change. Lymphatic: No bulky retroperitoneal, mesenteric, pelvic or inguinal lymphadenopathy. Reproductive: Post hysterectomy. No discrete adnexal lesion. Small amount of presumably reactive free fluid in the pelvic cul-de-sac. Other: Regional soft tissues appear normal. Musculoskeletal: No acute or aggressive osseous abnormalities. Moderate scoliotic curvature of the thoracolumbar spine with moderate to severe multilevel lumbar spine DDD, worse at L2-L3 and L3-L4 with disc space height loss, endplate irregularity and sclerosis. Review of the MIP images confirms the above findings. IMPRESSION: Vascular Impression: 1. Acute intramural hematoma/type B thoracic aortic dissection extending from the takeoff of the left subclavian artery to the level of the right external iliac artery. There is minimal opacification of the intramural hematoma/false lumen at the level of the distal descending thoracic aorta with otherwise non opacification of false lumen. Minimal amount of perivascular stranding at the level of the thoracic aorta but without evidence of contrast extravasation. 2. Review of the precontrast images demonstrate high-density contrast material within the false lumen of the abdominal aorta, the origin and proximal aspects of the right renal artery as well as the right external iliac artery, as a sequela of subintimal contrast administration during preceding attempted mesenteric arteriogram. 3. Intramural hematoma results in approximately 50% luminal narrowing of the origin the right renal artery. The remaining branch vessels of the abdominal aorta appear widely patent a without hemodynamically significant narrowing. 4. Non flow limiting dissection involving the right common and external iliac arteries. 5.  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 6.  Aortic Atherosclerosis (ICD10-I70.0). 7. Borderline cardiomegaly. Nonvascular Impression: 1. Indeterminate bilateral pulmonary nodules, largest of which within the right upper lobe measures 6 mm in diameter. By report, pulmonary nodules have been demonstrated on outside examinations and as such, correlation with prior outside examinations is advised. Otherwise, a non-contrast chest CT at 3-6 months is recommended. If the nodules are stable at time of repeat CT, then future CT at 18-24 months (from  today's scan) is considered optional for low-risk patients, but is recommended for high-risk patients. This recommendation follows the consensus statement: Guidelines for Management of Incidental Pulmonary Nodules Detected on CT Images: From the Fleischner Society 2017; Radiology 2017; 284:228-243. Electronically Signed   By: Sandi Mariscal M.D.   On: 12/27/2018 14:46   Ir Radiologist Eval & Mgmt  Result Date: 12/14/2018 Please refer to notes tab for details about interventional procedure. (Op Note)  Ct Angio Abd/pel W/ And/or W/o  Result Date: 12/07/2018 CLINICAL DATA:  Vascular lesion near the pancreatic tail seen on endoscopic ultrasound EXAM: CTA ABDOMEN AND PELVIS WITH CONTRAST TECHNIQUE: Multidetector CT imaging of the abdomen and pelvis was performed using the standard protocol during bolus administration of intravenous contrast. Multiplanar reconstructed images and MIPs were obtained and reviewed to evaluate the vascular anatomy. CONTRAST:  157m OMNIPAQUE IOHEXOL 350 MG/ML SOLN COMPARISON:  CT 08/29/2015 FINDINGS: VASCULAR Aorta: Scattered calcified atheromatous plaque. No dissection, aneurysm, or stenosis. Celiac: Patent without evidence of aneurysm, dissection, vasculitis or significant stenosis. Tortuous splenic artery. 4.9 cm  pseudoaneurysm related to the distal splenic artery, possibly from a short gastric branch, interposed between the pancreatic tail and splenic hilum. SMA: Widely patent. Replaced right hepatic arterial supply, an anatomic variant. Renals: Single left, widely patent. Single right, with partially calcified ostial plaque resulting in short segment stenosis of possible hemodynamic significance, patent distally. IMA: Patent without evidence of aneurysm, dissection, vasculitis or significant stenosis. Inflow: Tortuous iliac arterial system with scattered calcified atheromatous plaque. No aneurysm, dissection, or stenosis. Proximal Outflow: Bilateral common femoral and visualized portions of the superficial and profunda femoral arteries are patent without evidence of aneurysm, dissection, vasculitis or significant stenosis. Veins: Patent portal vein, splenic vein, SMV, bilateral renal veins. No obvious venous pathology within the limitations of this predominately arterial phase study. Review of the MIP images confirms the above findings. NON-VASCULAR Lower chest: Dependent atelectasis posteriorly in both lung bases. No pleural or pericardial effusion. Hepatobiliary: No focal liver abnormality is seen. Status post cholecystectomy. No biliary dilatation. Pancreas: Unremarkable. No pancreatic ductal dilatation or surrounding inflammatory changes. Spleen: Normal in size. No focal lesion. Adrenals/Urinary Tract: Normal adrenals. 4.5 cm upper pole right renal cyst. No hydronephrosis. Urinary bladder incompletely distended. Stomach/Bowel: Stomach and small bowel are nondistended. Previous appendectomy. 1 Moderate colonic fecal material without dilatation. Lymphatic: No abdominal or pelvic adenopathy. Reproductive: Status post hysterectomy. No adnexal masses. Other: Bilateral pelvic phleboliths. Trace free pelvic fluid. No free air. Musculoskeletal: Lumbar levoscoliosis apex L3 with multilevel spondylitic change. Sacral Tarlov  cysts. No fracture or worrisome bone lesion. IMPRESSION: 1. 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 Visceral aneurysms greater than 2 cm have increased risk of rupture. Consider IR consultation for possible embolization. 2. Aortoiliac atherosclerosis (ICD10-170.0) without aneurysm or stenosis. Electronically Signed   By: DLucrezia EuropeM.D.   On: 12/07/2018 11:47    (Echo, Carotid, EGD, Colonoscopy, ERCP)    Subjective: Patient denies any acute complaints despite a heart rate of the 120s.  She inquires about going home and has been cleared by cardiology for the same as she has refused cardioversion once again.  Discharge Exam: Vitals:   01/04/19 1300 01/04/19 1512  BP:  129/82  Pulse:  (!) 126  Resp:  16  Temp:  98.9 F (37.2 C)  SpO2: 98% 97%   Vitals:   01/03/19 2008 01/04/19 0332 01/04/19 1300 01/04/19 1512  BP: 132/88 (!) 126/92  129/82  Pulse: (!) 115 (!) 121  (!) 126  Resp: 17   16  Temp: 98.8 F (37.1 C) 98.1 F (36.7 C)  98.9 F (37.2 C)  TempSrc: Oral Oral  Oral  SpO2: 95% 98% 98% 97%  Weight:      Height:        General exam: Appears calm and in no acute distress Respiratory system: Clear to auscultation. Respiratory effort normal. Cardiovascular system: Irregularly irregular, tachycardic. No JVD, murmurs, rubs, gallops or clicks. No pedal edema. Gastrointestinal system: Abdomen is nondistended, soft and nontender. No organomegaly or masses felt. Normal bowel sounds heard. Central nervous system: Alert and oriented. No focal neurological deficits. Extremities: Symmetric 5 x 5 power. Skin: No rashes, lesions or ulcers Psychiatry: Judgement and insight appear normal. Mood & affect appropriate.     The results of significant diagnostics from this hospitalization (including imaging, microbiology, ancillary and laboratory) are listed below for reference.     Microbiology: Recent Results (from the past 240 hour(s))  Culture, blood (routine x 2)     Status: None  (Preliminary result)   Collection Time: 01/01/19  2:48 PM   Specimen: BLOOD  Result Value Ref Range Status   Specimen Description BLOOD RIGHT ANTECUBITAL  Final   Special Requests   Final    BOTTLES DRAWN AEROBIC AND ANAEROBIC Blood Culture adequate volume   Culture   Final    NO GROWTH 3 DAYS Performed at [facility], [address] [place], [place] [address]    Report Status PENDING  Incomplete  Culture, blood (routine x 2)     Status: None (Preliminary result)   Collection Time: 01/01/19  2:52 PM   Specimen: BLOOD LEFT HAND  Result Value Ref Range Status   Specimen Description BLOOD LEFT HAND  Final   Special Requests   Final    BOTTLES DRAWN AEROBIC AND ANAEROBIC Blood Culture adequate volume   Culture   Final    NO GROWTH 3 DAYS Performed at [facility], [place] [address], [place], [place] [address]    Report Status PENDING  Incomplete  Culture, Urine     Status: Abnormal   Collection Time: 01/01/19 11:42 PM   Specimen: Urine, Random  Result Value Ref Range Status   Specimen Description URINE, RANDOM  Final   Special Requests   Final    NONE Performed at [facility], [place] [address] [place],  [address]    Culture MULTIPLE SPECIES PRESENT, SUGGEST RECOLLECTION (A)  Final   Report Status 01/03/2019 FINAL  Final     Labs: BNP (last 3 results) No results for input(s): BNP in the last 8760 hours. Basic Metabolic Panel: Recent Labs  Lab 12/30/18 0054 12/31/18 0238 01/01/19 0230 01/02/19 1038 01/03/19 0214 01/04/19 0300  NA 131* 131* 130* 129* 133* 131*  K 4.0 4.2 4.0 4.4 4.5 4.2  CL 98 102 100 98 104 102  CO2 21* 19* 20* 21* 20* 20*  GLUCOSE 119* 105* 102* 135* 95 93  BUN '22 19 20 16 14 12  '$ CREATININE 1.05* 0.84 0.85 0.75 0.70 0.66  CALCIUM 8.5* 8.5* 8.5* 8.4* 8.2* 8.5*  MG 2.1 2.0 1.8  --   --   --   PHOS 3.5 2.8 1.7*  --   --   --    Liver Function Tests: Recent Labs  Lab 12/30/18 0054 12/31/18 0238 01/01/19 0230  AST 19 14*  17  ALT '13 10 13  '$ ALKPHOS 45 52 50  BILITOT 1.8* 1.7* 1.3*  PROT 6.2* 5.8* 6.0*  ALBUMIN 3.0* 2.6* 3.3*   No results for input(s): LIPASE, AMYLASE in the last 168 hours. No results for input(s): AMMONIA in the last 168 hours. CBC: Recent Labs  Lab 12/31/18 0238 01/01/19 0230 01/02/19 1038 01/03/19 0214 01/04/19 0300  WBC 14.4* 11.7* 12.1* 10.5 9.5  HGB 13.4 11.8* 12.3 11.3* 11.4*  HCT 40.6 35.6* 36.5 34.5* 34.4*  MCV 92.7 91.8 91.0 92.2 91.2  PLT 566* 592* 741* 704* 829*   Cardiac Enzymes: No results for input(s): CKTOTAL, CKMB, CKMBINDEX, TROPONINI in the last 168 hours. BNP: Invalid input(s): POCBNP CBG: Recent Labs  Lab 12/30/18 0035 12/31/18 1348  GLUCAP 122* 128*   D-Dimer No results for input(s): DDIMER in the last 72 hours. Hgb A1c No results for input(s): HGBA1C in the last 72 hours. Lipid Profile No results for input(s): CHOL, HDL, LDLCALC, TRIG, CHOLHDL, LDLDIRECT in the last 72 hours. Thyroid function studies No results for input(s): TSH, T4TOTAL, T3FREE, THYROIDAB in the last 72 hours.  Invalid input(s): FREET3 Anemia work up No results for input(s): VITAMINB12, FOLATE, FERRITIN, TIBC, IRON, RETICCTPCT in the last 72 hours. Urinalysis    Component Value Date/Time   COLORURINE AMBER (A) 12/30/2018 0538   APPEARANCEUR HAZY (A) 12/30/2018 0538   LABSPEC 1.023 12/30/2018 0538   PHURINE 5.0 12/30/2018 0538   GLUCOSEU NEGATIVE 12/30/2018 0538   HGBUR MODERATE (A) 12/30/2018 0538   BILIRUBINUR NEGATIVE 12/30/2018 0538   KETONESUR 20 (A) 12/30/2018 0538   PROTEINUR 100 (A) 12/30/2018 0538   UROBILINOGEN 0.2 10/19/2008 0930   NITRITE NEGATIVE 12/30/2018 0538   LEUKOCYTESUR MODERATE (A) 12/30/2018 0538   Sepsis Labs Invalid input(s): PROCALCITONIN,  WBC,  LACTICIDVEN Microbiology Recent Results (from the past 240 hour(s))  Culture, blood (routine x 2)     Status: None (Preliminary result)   Collection Time: 01/01/19  2:48 PM   Specimen: BLOOD   Result Value Ref Range Status   Specimen Description BLOOD RIGHT ANTECUBITAL  Final   Special Requests   Final    BOTTLES DRAWN AEROBIC AND ANAEROBIC Blood Culture adequate volume   Culture   Final    NO GROWTH 3 DAYS Performed at [facility], [address], [place], [place] [address]    Report Status PENDING  Incomplete  Culture, blood (routine x 2)     Status: None (Preliminary result)   Collection Time: 01/01/19  2:52 PM   Specimen: BLOOD LEFT HAND  Result Value Ref Range Status   Specimen Description BLOOD LEFT HAND  Final   Special Requests   Final    BOTTLES DRAWN AEROBIC AND ANAEROBIC Blood Culture adequate volume   Culture   Final    NO GROWTH 3 DAYS Performed at [facility], [place] [address], [place], [place] [address]    Report Status PENDING  Incomplete  Culture, Urine     Status: Abnormal   Collection Time: 01/01/19 11:42 PM   Specimen: Urine, Random  Result Value Ref Range Status   Specimen Description URINE, RANDOM  Final   Special Requests   Final    NONE Performed at [facility], [place] [address], [place],  [address]    Culture MULTIPLE SPECIES PRESENT, SUGGEST RECOLLECTION (A)  Final   Report Status 01/03/2019 FINAL  Final     Time coordinating discharge: Over 30 minutes  SIGNED:   Charolotte Capuchin, MD  [facility] 01/04/2019, 4:33 PM   If 7PM-7AM, please contact night-coverage  [email or web address] Password [email or web address]

## 2019-01-06 LAB — CULTURE, BLOOD (ROUTINE X 2)
Culture: NO GROWTH
Culture: NO GROWTH
Special Requests: ADEQUATE
Special Requests: ADEQUATE

## 2019-01-11 ENCOUNTER — Telehealth: Payer: Self-pay | Admitting: Internal Medicine

## 2019-01-11 NOTE — Telephone Encounter (Signed)
Pt currently in the hospital in [place], states the doctors there want her to have an EGD. She does not want to have it done there. Pt wants virtual visit to discuss with Dr. Hilarie Fredrickson if he feels she really needs an EGD. Pt scheduled for virtual visit 01/25/19@3pm .

## 2019-01-11 NOTE — Telephone Encounter (Signed)
Pt scheduled the appt for when she hopes to be discharged from the hospital.

## 2019-01-11 NOTE — Telephone Encounter (Signed)
If she is currently hospitalized I would have to defer to the doctors she is currently seeing recommendations She did have an EUS recently and while not a complete EGD, views are obtained from the esophagus stomach and proximal duodenum which were normal this was in October [id number] Certainly reasonable to have her follow-up virtually once she is discharged

## 2019-01-17 ENCOUNTER — Encounter: Payer: Self-pay | Admitting: *Deleted

## 2019-01-25 ENCOUNTER — Ambulatory Visit: Payer: Medicare Other | Admitting: Internal Medicine

## 2019-02-05 ENCOUNTER — Telehealth: Payer: Self-pay | Admitting: Internal Medicine

## 2019-02-05 NOTE — Telephone Encounter (Signed)
Left message on machine to call back will need to have records faxed to our office to review

## 2019-02-08 ENCOUNTER — Other Ambulatory Visit: Payer: Self-pay

## 2019-02-13 ENCOUNTER — Other Ambulatory Visit: Payer: Self-pay | Admitting: Internal Medicine

## 2019-02-13 NOTE — Telephone Encounter (Signed)
Dr Hilarie Fredrickson, patient was recently started on plavix and we now get interaction alert for plavix/omeprazole together. Please advise.Marland KitchenMarland KitchenMarland Kitchen

## 2019-02-13 NOTE — Telephone Encounter (Signed)
Patient indicates that she is not sure if she is on plavix any longer or not. She wants to look in her medications and call us back.

## 2019-02-13 NOTE — Telephone Encounter (Signed)
Yes, please change to pantoprazole at same dose

## 2019-02-14 NOTE — Telephone Encounter (Signed)
Pt did not return the call. 

## 2019-02-20 ENCOUNTER — Other Ambulatory Visit: Payer: Self-pay | Admitting: Interventional Radiology

## 2019-02-20 DIAGNOSIS — I729 Aneurysm of unspecified site: Secondary | ICD-10-CM

## 2019-02-21 ENCOUNTER — Telehealth: Payer: Self-pay | Admitting: Internal Medicine

## 2019-02-21 NOTE — Telephone Encounter (Signed)
Patient is again quite confused over whether she is taking plavix, eliquis or both medications (as it shows in our system). She is not sure what she is taking but indicates she is going to talk to her cardiologist first and will then call us back.

## 2019-02-21 NOTE — Telephone Encounter (Signed)
Pt called returning call

## 2019-02-21 NOTE — Telephone Encounter (Signed)
Left message for patient to call back  

## 2019-04-04 ENCOUNTER — Other Ambulatory Visit: Payer: Self-pay | Admitting: Interventional Radiology

## 2019-04-04 DIAGNOSIS — I729 Aneurysm of unspecified site: Secondary | ICD-10-CM

## 2019-04-05 ENCOUNTER — Ambulatory Visit
Admission: RE | Admit: 2019-04-05 | Discharge: 2019-04-05 | Disposition: A | Discharge status: Home / Self Care | Payer: Medicare Other | Source: Ambulatory Visit | Attending: Interventional Radiology | Admitting: Interventional Radiology

## 2019-04-05 ENCOUNTER — Other Ambulatory Visit: Payer: Self-pay

## 2019-04-05 ENCOUNTER — Other Ambulatory Visit: Payer: Self-pay | Admitting: Interventional Radiology

## 2019-04-05 ENCOUNTER — Encounter: Payer: Self-pay | Admitting: *Deleted

## 2019-04-05 DIAGNOSIS — I729 Aneurysm of unspecified site: Secondary | ICD-10-CM

## 2019-04-05 HISTORY — PX: IR RADIOLOGIST EVAL & MGMT: IMG5224

## 2019-04-05 NOTE — Progress Notes (Signed)
Patient ID: Megan Mercer, female   DOB: 1935/10/14, 84 y.o.   MRN: [id number]         Chief Complaint: Splenic artery pseudoaneurysm  Referring Physician(s): Pyrtle, Ulice Dash  History of Present Illness:  Megan Mercer is a 84 y.o. female with past medical history significant for an atrial fibrillation (on Eliquis), hypertension, hyperlipidemia, GERD and peptic ulcer disease who underwent attempted percutaneous management of splenic artery pseudoaneurysm on 12/27/2018 however the procedure was complicated by creation of a false lumen with associated intramural hematoma/type B thoracic aortic dissection during initial attempted catheter placement.  As such, intervention was not able to be performed at that time.  Unfortunately the patient had a prolonged hospitalization following the attempted procedure due to refractory A. fib with RVR with heart rate within the 140s to 150s, however was ultimately discharged on 01/04/2019.  Note, the patient remains on Eliquis.  Patient is seen today via telemedicine for consultation regarding ultimate management of the splenic artery pseudoaneurysm.  In review, the patient was found to have an indeterminate mass at the level of the tail of the pancreas on outside chest CT performed in [place] earlier this year performed for the work-up of worsening fatigue, dehydration epigastric abdominal pain and nausea.  The patient was subsequently referred to gastroenterology here in [place] with subsequent endoscopy (performed on 10/8) demonstrating a hypervascular lesion adjacent to the tail of the pancreas.  Subsequent dedicated CTA of the abdomen and pelvis performed 12/07/2018 demonstrated a large (at least 4.9 cm) pseudoaneurysm involving the distal end of the aspect of the splenic artery, potentially arising from a short gastric branch, new compared to CT scan of the abdomen pelvis performed 08/2015.    Patient continues to complain of chronic  abdominal pain as well as intermittent episodes of dizziness however these are chronic symptoms for the patient had not acutely worsened or different. Patient denies chest pain or shortness of breath.  Patient remains very active and functional.  She is independent with all activities of daily living.  She lives alone and continues to drive.     Past Medical History:  Diagnosis Date  . Anemia   . Anxiety   . Arthritis   . Atrial fibrillation ([place])   . Barrett's esophagus   . Blood transfusion without reported diagnosis   . Complication of anesthesia   . Dysrhythmia   . GERD (gastroesophageal reflux disease)   . Glaucoma   . Hiatal hernia   . HLD (hyperlipidemia)   . HTN (hypertension)   . Hypothyroidism   . Internal hemorrhoids   . Iron deficiency anemia   . Pancreatic cyst   . Peptic ulcer disease with hemorrhage   . PONV (postoperative nausea and vomiting)    Confusion, Combativeness  . UTI (urinary tract infection)     Past Surgical History:  Procedure Laterality Date  . ABDOMINAL HYSTERECTOMY  1984  . APPENDECTOMY  2007  . CATARACT EXTRACTION Bilateral   . CHOLECYSTECTOMY  2007  . COLONOSCOPY    . ESOPHAGOGASTRODUODENOSCOPY (EGD) WITH PROPOFOL N/A 11/30/2018   Procedure: ESOPHAGOGASTRODUODENOSCOPY (EGD) WITH PROPOFOL;  Surgeon: Milus Banister, MD;  Location: [facility] END[facility]COPY;  Service: Endoscopy;  Laterality: N/A;  . EUS N/A 11/30/2018   Procedure: UPPER ENDOSCOPIC ULTRASOUND (EUS) RADIAL;  Surgeon: Milus Banister, MD;  Location: [facility];  Service: Endoscopy;  Laterality: N/A;  . IR EMBO ARTERIAL NOT HEMORR HEMANG INC GUIDE ROADMAPPING  12/27/2018  . IR RADIOLOGIST EVAL & MGMT  12/14/2018  . IR US GUIDE VASC ACCESS RIGHT  12/27/2018  . UPPER GASTROINTESTINAL ENDOSCOPY      Allergies: Ace inhibitors, Amoxicillin, Aspirin, Atorvastatin, Crestor  [rosuvastatin calcium], Iodine, Lipitor  [atorvastatin calcium], Oxycodone, Oxycodone-acetaminophen, Paroxetine hcl,  Sulfa antibiotics, Sulfasalazine, and Codeine  Medications: Prior to Admission medications   Medication Sig Start Date End Date Taking? Authorizing Provider  apixaban (ELIQUIS) 2.5 MG TABS tablet Take 2.5 mg by mouth 2 (two) times daily.     [provider]  Calcium Carb-Cholecalciferol (CALCIUM-VITAMIN D) 600-400 MG-UNIT TABS Take 1 tablet by mouth daily.    [provider]  clopidogrel (PLAVIX) 75 MG tablet Take 1 tablet (75 mg total) by mouth daily. 01/05/19   Charolotte Capuchin, MD  colestipol (COLESTID) 1 G tablet Take 2 g by mouth 2 (two) times daily.     [provider]  ezetimibe (ZETIA) 10 MG tablet Take 10 mg by mouth daily.    [provider]  folic acid (FOLVITE) [id number] MCG tablet Take 800 mcg by mouth daily.     [provider]  latanoprost (XALATAN) 0.005 % ophthalmic solution Place 1 drop into both eyes at bedtime. 08/11/18   [provider]  levothyroxine (SYNTHROID, LEVOTHROID) 75 MCG tablet Take 75 mcg by mouth daily before breakfast.    [provider]  metoprolol tartrate (LOPRESSOR) 100 MG tablet Take 1 tablet (100 mg total) by mouth 2 (two) times daily. 01/04/19   Charolotte Capuchin, MD  omeprazole (PRILOSEC) 40 MG capsule TAKE 1 CAPSULE TWICE A DAY (NEED OFFICE VISIT FOR FURTHER REFILLS) Patient taking differently: Take 40 mg by mouth daily.  11/13/18   Pyrtle, Lajuan Lines, MD  propafenone (RYTHMOL) 225 MG tablet Take 1 tablet (225 mg total) by mouth every 8 (eight) hours. 01/04/19   Charolotte Capuchin, MD  raloxifene (EVISTA) 60 MG tablet Take 60 mg by mouth at bedtime.     [provider]  timolol (BETIMOL) 0.5 % ophthalmic solution Place 1 drop into both eyes daily.    [provider]     Family History  Problem Relation Age of Onset  . Cancer Father        blood cancer, ? type  . Rheum arthritis Father   . Glaucoma Father   . Rheum arthritis Mother   . Arthritis Sister   . Diabetes Sister         TYPE 2  . Heart failure Sister   . Hyperlipidemia Sister   . Colon cancer Neg Hx   . Esophageal cancer Neg Hx   . Pancreatic cancer Neg Hx   . Stomach cancer Neg Hx   . Liver disease Neg Hx     Social History   Socioeconomic History  . Marital status: Single    Spouse name: Not on file  . Number of children: 0  . Years of education: Not on file  . Highest education level: Not on file  Occupational History  . Occupation: RETIRED  Tobacco Use  . Smoking status: Never Smoker  . Smokeless tobacco: Never Used  Substance and Sexual Activity  . Alcohol use: Yes    Alcohol/week: 0.0 standard drinks    Comment: rare  . Drug use: No  . Sexual activity: Not on file  Other Topics Concern  . Not on file  Social History Narrative  . Not on file   Social Determinants of Health   Financial Resource Strain:   . Difficulty of Paying Living Expenses: Not  on file  Food Insecurity:   . Worried About Charity fundraiser in the Last Year: Not on file  . Ran Out of Food in the Last Year: Not on file  Transportation Needs:   . Lack of Transportation (Medical): Not on file  . Lack of Transportation (Non-Medical): Not on file  Physical Activity:   . Days of Exercise per Week: Not on file  . Minutes of Exercise per Session: Not on file  Stress:   . Feeling of Stress : Not on file  Social Connections:   . Frequency of Communication with Friends and Family: Not on file  . Frequency of Social Gatherings with Friends and Family: Not on file  . Attends Religious Services: Not on file  . Active Member of Clubs or Organizations: Not on file  . Attends Archivist Meetings: Not on file  . Marital Status: Not on file    ECOG Status: 1 - Symptomatic but completely ambulatory  Review of Systems  Review of Systems: A 12 point ROS discussed and pertinent positives are indicated in the HPI above.  All other systems are negative.  Physical Exam No direct physical exam was performed  (except for noted visual exam findings with Video Visits).   Vital Signs: There were no vitals taken for this visit.  Imaging:  CT chest, abdomen and pelvis - 12/27/2018 CT abdomen and pelvis - 12/07/2018 Attempted mesenteric arteriogram - 12/27/2018   Labs:  CBC: Recent Labs    01/01/19 0230 01/02/19 1038 01/03/19 0214 01/04/19 0300  WBC 11.7* 12.1* 10.5 9.5  HGB 11.8* 12.3 11.3* 11.4*  HCT 35.6* 36.5 34.5* 34.4*  PLT 592* 741* 704* 829*    COAGS: No results for input(s): INR, APTT in the last 8760 hours.  BMP: Recent Labs    01/01/19 0230 01/02/19 1038 01/03/19 0214 01/04/19 0300  NA 130* 129* 133* 131*  K 4.0 4.4 4.5 4.2  CL 100 98 104 102  CO2 20* 21* 20* 20*  GLUCOSE 102* 135* 95 93  BUN 20 16 14 12   CALCIUM 8.5* 8.4* 8.2* 8.5*  CREATININE 0.85 0.75 0.70 0.66  GFRNONAA >60 >60 >60 >60  GFRAA >60 >60 >60 >60    LIVER FUNCTION TESTS: Recent Labs    10/21/18 1341 12/30/18 0054 12/31/18 0238 01/01/19 0230  BILITOT 0.9 1.8* 1.7* 1.3*  AST 22 19 14* 17  ALT 17 13 10 13   ALKPHOS 66 45 52 50  PROT 7.4 6.2* 5.8* 6.0*  ALBUMIN 4.1 3.0* 2.6* 3.3*    TUMOR MARKERS: No results for input(s): AFPTM, CEA, CA199, CHROMGRNA in the last 8760 hours.  Assessment and Plan:  FERNANDO VANHORN is a 84 y.o. female with past medical history significant for an atrial fibrillation (on Eliquis), hypertension, hyperlipidemia, GERD and peptic ulcer disease who underwent attempted percutaneous management of splenic artery pseudoaneurysm on 12/27/2018 however the procedure was complicated by creation of a false lumen with associated intramural hematoma/type B thoracic aortic dissection during initial attempted catheter placement.  As such, intervention was not able to be performed at that time.  Fortunately, the patient has recovered from the procedural related complication without requiring additional intervention.  The patient remains interested in undergoing intervention  for this incidentally discovered splenic artery pseudoaneurysm and as such, I will proceed with obtaining a dissection protocol CT of the chest, abdomen and pelvis.  Following acquisition of the postsurgical dissection protocol CT scan, I will have a telemedicine consultation with the patient  to discuss the results as well as being her interest level and an additional attempts at percutaneous intervention.  Again, the patient was educated as to the signs and symptoms if she were to experience rupture of the splenic aneurysm for which she demonstrated excellent understanding.  Patient knows to call the interventional radiology clinic with any interval questions or concerns.  Plan:  -  Follow-up telemedicine consultation in 3-4 weeks following acquisition of dissection protocol CT scan of the chest, abdomen and pelvis (CT to be [facility] at [facility] per patient request).   Thank you for this interesting consult.  I greatly enjoyed meeting Seidy J Pablo and look forward to participating in their care.  A copy of this report was sent to the requesting provider on this date.  Electronically Signed: Sandi Mariscal 04/05/2019, 11:21 AM   I spent a total of 15 Minutes in remote  clinical consultation, greater than 50% of which was counseling/coordinating care for splenic artery pseudoaneurysm.    Visit type: Audio only (telephone). Audio (no video) only due to patient's lack of internet/smartphone capability. Alternative for in-person consultation at [facility], [place] [address], [place], [place]. This visit type was conducted due to national recommendations for restrictions regarding the COVID-19 Pandemic (e.g. social distancing).  This format is felt to be most appropriate for this patient at this time.  All issues noted in this document were discussed and addressed.

## 2019-04-24 ENCOUNTER — Other Ambulatory Visit: Payer: Self-pay

## 2019-04-24 ENCOUNTER — Ambulatory Visit (HOSPITAL_COMMUNITY)
Admission: RE | Admit: 2019-04-24 | Discharge: 2019-04-24 | Disposition: A | Discharge status: Home / Self Care | Payer: Medicare Other | Source: Ambulatory Visit | Attending: Interventional Radiology | Admitting: Interventional Radiology

## 2019-04-24 DIAGNOSIS — I729 Aneurysm of unspecified site: Secondary | ICD-10-CM | POA: Diagnosis present

## 2019-04-24 LAB — POCT I-STAT CREATININE: Creatinine, Ser: 0.8 mg/dL (ref 0.44–1.00)

## 2019-04-24 MED ORDER — IOHEXOL 350 MG/ML SOLN
100.0000 mL | Freq: Once | INTRAVENOUS | Status: AC | PRN
  Administered 2019-04-24: 11:00:00 100 mL via INTRAVENOUS

## 2019-04-26 ENCOUNTER — Other Ambulatory Visit: Payer: Self-pay

## 2019-04-26 ENCOUNTER — Encounter: Payer: Self-pay | Admitting: *Deleted

## 2019-04-26 ENCOUNTER — Other Ambulatory Visit: Payer: Self-pay | Admitting: Interventional Radiology

## 2019-04-26 ENCOUNTER — Ambulatory Visit
Admission: RE | Admit: 2019-04-26 | Discharge: 2019-04-26 | Disposition: A | Discharge status: Home / Self Care | Payer: Medicare Other | Source: Ambulatory Visit | Attending: Interventional Radiology | Admitting: Interventional Radiology

## 2019-04-26 DIAGNOSIS — I729 Aneurysm of unspecified site: Secondary | ICD-10-CM

## 2019-04-26 HISTORY — PX: IR RADIOLOGIST EVAL & MGMT: IMG5224

## 2019-04-26 NOTE — Progress Notes (Signed)
Patient ID: Megan Mercer, female   DOB: 05-08-1935, 84 y.o.   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  As such, intervention was not able to be performed at that time.  Patient is seen again today in consultation following acquisition of postprocedural dissection protocol CT scan of the chest, abdomen and pelvis performed 04/24/2019.  Patient states that while her appetite is much improved, her energy level remains low and she reports diffuse weakness.  Additionally, patient continues to complain of chronic abdominal pain as well as intermittent episodes of dizziness however these are chronic symptoms for the patient had not acutely worsened or different. Patient denies chest pain or shortness of breath.  Patient remains very active and functional. She is independent with all activities of daily living. She lives alone and continues to drive.    Understandably, the patient is apprehensive to undergo future intervention.    Additionally, patient is to undergo outside consultation on March 18 with vascular surgeon at [facility].    Past Medical History:  Diagnosis Date  . Anemia   . Anxiety   . Arthritis   . Atrial fibrillation ([place])   . Barrett's esophagus   . Blood transfusion without reported diagnosis   . Complication of anesthesia   . Dysrhythmia   . GERD  (gastroesophageal reflux disease)   . Glaucoma   . Hiatal hernia   . HLD (hyperlipidemia)   . HTN (hypertension)   . Hypothyroidism   . Internal hemorrhoids   . Iron deficiency anemia   . Pancreatic cyst   . Peptic ulcer disease with hemorrhage   . PONV (postoperative nausea and vomiting)    Confusion, Combativeness  . UTI (urinary tract infection)     Past Surgical History:  Procedure Laterality Date  . ABDOMINAL HYSTERECTOMY  1984  . APPENDECTOMY  2007  . CATARACT EXTRACTION Bilateral   . CHOLECYSTECTOMY  2007  . COLONOSCOPY    . ESOPHAGOGASTRODUODENOSCOPY (EGD) WITH PROPOFOL N/A 11/30/2018   Procedure: ESOPHAGOGASTRODUODENOSCOPY (EGD) WITH PROPOFOL;  Surgeon: Milus Banister, MD;  Location: [facility];  Service: Endoscopy;  Laterality: N/A;  . EUS N/A 11/30/2018   Procedure: UPPER ENDOSCOPIC ULTRASOUND (EUS) RADIAL;  Surgeon: Milus Banister, MD;  Location: [facility];  Service: Endoscopy;  Laterality: N/A;  . IR EMBO ARTERIAL NOT HEMORR HEMANG INC GUIDE ROADMAPPING  12/27/2018  . IR RADIOLOGIST EVAL & MGMT  12/14/2018  . IR RADIOLOGIST EVAL & MGMT  04/05/2019  . IR US GUIDE VASC ACCESS RIGHT  12/27/2018  . UPPER GASTROINTESTINAL ENDOSCOPY      Allergies: Ace inhibitors, Amoxicillin, Aspirin, Atorvastatin, Crestor  [rosuvastatin calcium], Iodine, Lipitor  [atorvastatin calcium], Oxycodone, Oxycodone-acetaminophen, Paroxetine hcl, Sulfa antibiotics, Sulfasalazine, and Codeine  Medications: Prior to Admission medications   Medication Sig Start Date End Date Taking? Authorizing Provider  apixaban (ELIQUIS) 2.5 MG TABS tablet Take 2.5 mg by mouth 2 (two) times daily.     [provider]  Calcium Carb-Cholecalciferol (CALCIUM-VITAMIN D) 600-400 MG-UNIT TABS Take  1 tablet by mouth daily.    [provider]  clopidogrel (PLAVIX) 75 MG tablet Take 1 tablet (75 mg total) by mouth daily. 01/05/19   Charolotte Capuchin, MD  colestipol (COLESTID) 1 G tablet Take 2 g  by mouth 2 (two) times daily.     [provider]  ezetimibe (ZETIA) 10 MG tablet Take 10 mg by mouth daily.    [provider]  folic acid (FOLVITE) Q000111Q MCG tablet Take 800 mcg by mouth daily.     [provider]  latanoprost (XALATAN) 0.005 % ophthalmic solution Place 1 drop into both eyes at bedtime. 08/11/18   [provider]  levothyroxine (SYNTHROID, LEVOTHROID) 75 MCG tablet Take 75 mcg by mouth daily before breakfast.    [provider]  metoprolol tartrate (LOPRESSOR) 100 MG tablet Take 1 tablet (100 mg total) by mouth 2 (two) times daily. 01/04/19   Charolotte Capuchin, MD  omeprazole (PRILOSEC) 40 MG capsule TAKE 1 CAPSULE TWICE A DAY (NEED OFFICE VISIT FOR FURTHER REFILLS) Patient taking differently: Take 40 mg by mouth daily.  11/13/18   Pyrtle, Lajuan Lines, MD  propafenone (RYTHMOL) 225 MG tablet Take 1 tablet (225 mg total) by mouth every 8 (eight) hours. 01/04/19   Charolotte Capuchin, MD  raloxifene (EVISTA) 60 MG tablet Take 60 mg by mouth at bedtime.     [provider]  timolol (BETIMOL) 0.5 % ophthalmic solution Place 1 drop into both eyes daily.    [provider]     Family History  Problem Relation Age of Onset  . Cancer Father        blood cancer, ? type  . Rheum arthritis Father   . Glaucoma Father   . Rheum arthritis Mother   . Arthritis Sister   . Diabetes Sister        TYPE 2  . Heart failure Sister   . Hyperlipidemia Sister   . Colon cancer Neg Hx   . Esophageal cancer Neg Hx   . Pancreatic cancer Neg Hx   . Stomach cancer Neg Hx   . Liver disease Neg Hx     Social History   Socioeconomic History  . Marital status: Single    Spouse name: Not on file  . Number of children: 0  . Years of education: Not on file  . Highest education level: Not on file  Occupational History  . Occupation: RETIRED  Tobacco Use  . Smoking status: Never Smoker  . Smokeless tobacco: Never Used  Substance and Sexual  Activity  . Alcohol use: Yes    Alcohol/week: 0.0 standard drinks    Comment: rare  . Drug use: No  . Sexual activity: Not on file  Other Topics Concern  . Not on file  Social History Narrative  . Not on file   Social Determinants of Health   Financial Resource Strain:   . Difficulty of Paying Living Expenses: Not on file  Food Insecurity:   . Worried About Charity fundraiser in the Last Year: Not on file  . Ran Out of Food in the Last Year: Not on file  Transportation Needs:   . Lack of Transportation (Medical): Not on file  . Lack of Transportation (Non-Medical): Not on file  Physical Activity:   . Days of Exercise per Week: Not on file  . Minutes of Exercise per Session: Not on file  Stress:   . Feeling of Stress : Not on file  Social  Connections:   . Frequency of Communication with Friends and Family: Not on file  . Frequency of Social Gatherings with Friends and Family: Not on file  . Attends Religious Services: Not on file  . Active Member of Clubs or Organizations: Not on file  . Attends Archivist Meetings: Not on file  . Marital Status: Not on file    ECOG Status: 1 - Symptomatic but completely ambulatory  Review of Systems  Review of Systems: A 12 point ROS discussed and pertinent positives are indicated in the HPI above.  All other systems are negative.  Physical Exam No direct physical exam was performed (except for noted visual exam findings with Video Visits).   Vital Signs: There were no vitals taken for this visit.  Imaging: CT Angio Chest/Abd/Pel for Dissection W and/or W/WO  Addendum Date: 04/25/2019   ADDENDUM REPORT: 04/25/2019 08:21 ADDENDUM: Enlargement of the descending thoracic aorta at the areas of focal dissection or pseudoaneurysm. Descending thoracic aorta now measures up to 3.8 cm. Electronically Signed   By: Markus Daft M.D.   On: 04/25/2019 08:21   Result Date: 04/25/2019 CLINICAL DATA:  84 year old with splenic artery  pseudoaneurysm. History of type B aortic dissection related to arterial catheterization. EXAM: CT ANGIOGRAPHY CHEST, ABDOMEN AND PELVIS TECHNIQUE: Multidetector CT imaging through the chest, abdomen and pelvis was performed using the standard protocol during bolus administration of intravenous contrast. Multiplanar reconstructed images and MIPs were obtained and reviewed to evaluate the vascular anatomy. CONTRAST:  152mL OMNIPAQUE IOHEXOL 350 MG/ML SOLN COMPARISON:  12/27/2018 FINDINGS: CTA CHEST FINDINGS Cardiovascular: Ascending thoracic aorta measures 3.2 cm and stable. No evidence for dissection involving the ascending thoracic aorta. Common trunk for the brachiocephalic artery and left common carotid artery. Left subclavian artery is patent. No dissection involving the great vessels. Proximal vertebral arteries are patent bilaterally. Proximal descending thoracic aorta has decreased in size measuring 3.1 cm and previously measured up to 3.6 cm. No definite false lumen or dissection involving the proximal descending thoracic aorta. Mid descending thoracic aorta measures 2.8 cm and previously measured 2.9 cm. Aortic dissection involving the mid/distal descending thoracic aorta. Mid/distal descending thoracic aorta measures up to 3.3 cm and previously measured 2.8 cm. Enlargement of the distal descending thoracic aorta with a focal dissection or pseudoaneurysm. The distal descending thoracic aorta measures up to 3.8 cm on sequence 10, image 61 and previously measured 3.0 cm. Heart is prominent for size with no significant pericardial fluid. Mediastinum/Nodes: No mediastinal or hilar lymphadenopathy. No axillary lymph node enlargement. Lungs/Pleura: Dependent densities in both lungs and there is pleural thickening or trace pleural fluid. Trachea and mainstem bronchi are patent. Stable 6 mm nodule in the right upper lobe near the right minor fissure on sequence 8 image 75. Additional small nodular densities in the  periphery of the right upper lobe on series 8, image 76 and 72. Stable nodular densities along the right minor fissure on sequence 8, image 81. A new pleural-based nodular density in the medial anterior right upper lobe on image 77 that measures up to 7 mm. Additional small pleural-based densities in the medial right upper lung could represent postinflammatory changes. Again noted are small irregular nodular densities in the medial left upper lobe that are similar to the previous examination. Index nodule on sequence 8, image 656 measures 5 mm and stable. Musculoskeletal: No acute bone abnormality. Review of the MIP images confirms the above findings. CTA ABDOMEN AND PELVIS FINDINGS VASCULAR Aorta: Aortic dissection appears  to resolve at the aortic hiatus. There is no significant dissection identified in the abdominal aorta. Abdominal aorta is patent with atherosclerotic calcifications but no significant stenosis. Celiac: Significant narrowing of the celiac trunk compared to the prior CTA images. Celiac trunk is patent. There is flow in the left gastric artery and common hepatic artery. The splenic artery remains patent with a very tortuous course. Again noted is a large aneurysm or pseudoaneurysm coming off a distal splenic artery branch near the splenic hilum. Majority of this aneurysm contains thrombus but there is arterial flow within the aneurysm. The aneurysm measures 4.7 x 4.5 x 4.8 cm and measured 4.6 x 4.4 x 4.2 cm on 12/27/2018. The splenic artery proximal to the aneurysm is very small but patent. SMA: SMA is patent. Question a small dissection involving the proximal aspect of the SMA. Main SMA branches are patent. Renals: Bilateral renal arteries are patent without aneurysm or dissection. IMA: IMA is patent.  No evidence for dissection involving the IMA. Inflow: There is no clear evidence for a dissection involving the right common iliac artery. 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 The mid and distal right external iliac artery are patent without definite dissection. Left common iliac artery measures 1.5 cm and minimally changed. No dissection or significant stenosis involving the left iliac arteries. The common, internal and external iliac arteries are patent bilaterally. Proximal femoral arteries are patent bilaterally. Veins: No obvious venous abnormality within the limitations of this arterial phase study. 1 Review of the MIP images confirms the above findings. NON-VASCULAR Hepatobiliary: Cholecystectomy.  No acute abnormality to the liver. Pancreas: Stable appearance of the pancreas without inflammatory changes. Again noted is mild dilatation of the main pancreatic duct. Distal common bile duct is prominent measuring 0.6 cm and stable. Spleen: Normal in size without focal abnormality. Adrenals/Urinary Tract: Left adrenal gland appears normal. Right adrenal gland is not well visualized. 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 Again noted is a prominent right renal cyst that measures roughly 4.5 cm. No suspicious renal lesion. Probable 0.3 cm right kidney stone. Negative for hydronephrosis. Stomach/Bowel: Probable small hiatal hernia. Large amount of stool throughout the colon. No evidence for bowel obstruction or focal bowel inflammation. Lymphatic: No significant lymph node enlargement in the abdomen or pelvis. Reproductive: Status post hysterectomy. No adnexal masses. Other: Small amount of pelvic ascites. Ascites appears to be low density. There is no significant abdominal ascites. Negative for free air. There is a stable subcutaneous low-density structure in the left lower anterior abdominal soft tissues. This subcutaneous nodular structure measures up to 1.4 cm and minimally changed. Musculoskeletal: Levoscoliosis in the lumbar spine. 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 Review of the MIP images confirms the above  findings. IMPRESSION: Vascular: 1. Interval change in the aortic dissection. Dissection has essentially resolved in the proximal descending thoracic aorta and abdominal aorta. Residual aortic dissection involving the mid and distal descending thoracic aorta. Residual aortic dissection appears to be complex with two focal dissection segments with contrast in the false lumens. These areas of aortic dissection could be evolving into focal pseudoaneurysm formations. 2. Persistent dissection involving the proximal right external iliac artery. No significant dissection involving the right common iliac artery. 3. Slight enlargement of the large pseudoaneurysm involving a distal splenic artery branch. Pseudoaneurysm measures 4.7 x 4.5 x 4.8 cm and measured 4.6 x 4.4 x 4.2 cm on 12/27/2018. Splenic artery anatomy is complex due  to severe tortuosity. 4. Marked narrowing of the celiac artery trunk likely related to prior dissection. 5. Cannot exclude a small dissection involving the proximal SMA. SMA and main branches are patent. Non vascular: 1. Bilateral pulmonary nodules. Majority of the pulmonary nodules are stable but there is a new 6 mm nodule along the anterior right upper lobe that could be postinflammatory but indeterminate. Non-contrast chest CT at 3-6 months is recommended. If the nodules are stable at time of repeat CT, then future CT at 18-24 months (from today's scan) is considered optional for low-risk patients, but is recommended for high-risk patients. This recommendation follows the consensus statement: Guidelines for Management of Incidental Pulmonary Nodules Detected on CT Images: From the Fleischner Society 2017; Radiology 2017; 284:228-243. 2. Pleural thickening and/or trace pleural fluid bilaterally. 3. New trace ascites in the pelvis.  Etiology is unknown. Electronically Signed: By: Markus Daft M.D. On: 04/24/2019 17:32   IR Radiologist Eval & Mgmt  Result Date: 04/05/2019 Please refer to notes tab  for details about interventional procedure. (Op Note)   Labs:  CBC: Recent Labs    01/01/19 0230 01/02/19 1038 01/03/19 0214 01/04/19 0300  WBC 11.7* 12.1* 10.5 9.5  HGB 11.8* 12.3 11.3* 11.4*  HCT 35.6* 36.5 34.5* 34.4*  PLT 592* 741* 704* 829*    COAGS: No results for input(s): INR, APTT in the last 8760 hours.  BMP: Recent Labs    01/01/19 0230 01/01/19 0230 01/02/19 1038 01/03/19 0214 01/04/19 0300 04/24/19 1117  NA 130*  --  129* 133* 131*  --   K 4.0  --  4.4 4.5 4.2  --   CL 100  --  98 104 102  --   CO2 20*  --  21* 20* 20*  --   GLUCOSE 102*  --  135* 95 93  --   BUN 20  --  16 14 12   --   CALCIUM 8.5*  --  8.4* 8.2* 8.5*  --   CREATININE 0.85   < > 0.75 0.70 0.66 0.80  GFRNONAA >60  --  >60 >60 >60  --   GFRAA >60  --  >60 >60 >60  --    < > = values in this interval not displayed.    LIVER FUNCTION TESTS: Recent Labs    10/21/18 1341 12/30/18 0054 12/31/18 0238 01/01/19 0230  BILITOT 0.9 1.8* 1.7* 1.3*  AST 22 19 14* 17  ALT 17 13 10 13   ALKPHOS 66 45 52 50  PROT 7.4 6.2* 5.8* 6.0*  ALBUMIN 4.1 3.0* 2.6* 3.3*    TUMOR MARKERS: No results for input(s): AFPTM, CEA, CA199, CHROMGRNA in the last 8760 hours.  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  As such, intervention was not able to be performed at that time.  Unfortunately, personal review of postprocedural dissection protocol CT scan of the chest, abdomen pelvis demonstrates development of 2 contained pseudoaneurysms involving the mid and distal aspect of the  descending thoracic aorta with regional ectasia, the sequela of iatrogenic injury occurred at the time of attempted percutaneous coil embolization of splenic artery pseudoaneurysm.  Additionally, there has been continued slight progression in size  of the splenic artery pseudoaneurysm, currently measuring 5.4 x 4.6 x 4.4 cm, previously, 5.0 x 4.4 x 3.9 cm (when compared to the 12/07/2018 examination), though the amount of mural thrombus may has increased in the interval.  Above findings were discussed in detail with the patient who demonstrated fair understanding.    I explained that I would discuss the appropriateness of intervention versus surveillance of the descending thoracic aortic pseudoaneurysms with consulting vascular surgeon Dr. Donnetta Hutching, who assisted with the patient's management during her hospitalization.    I explained that the splenic artery pseudoaneurysm has continued to increase in size and as such still needs to be addressed.  I explained that again, percutaneous intervention may prove challenging given the narrowing of the celiac artery (which is unchanged since the 08/2015 examination) as well as marked tortuosity of the feeding splenic artery and patulous nature of the aneurysm.  Additionally transcatheter intervention is challenging given the apparent fragility of her vasculature.  I explained that splenectomy may ultimately be the required though I am doubtful operative intervention will be pursued prior to a repeat attempt at percutaneous management.    As above, the patient is understandably apprehensive about undergoing an additional intervention and is pursuing a consultation at [facility] vascular surgeon, Dr. Bonnee Quin on March 18 which I think this is reasonable given the complexity and medical fragility of the patient.  Plan: - Will discuss appropriateness of attempted intervention versus continued surveillance of the descending thoracic aortic pseudoaneurysms with [facility] Vascular Surgeon, Dr. Donnetta Hutching.  Note, if intervention is warranted (and patient wishes to undergo this procedure within the [facility] health system), would likely consider performing both the thoracic and splenic interventions during the same admission. - Will plan on repeat telemedicine consultation on 3/23 (after patient's outside consultation) to formulate a definitive plan of care.  The patient knows to call the interventional radiology clinic with any interval questions or concerns and is in agreement with the above plan of care.  A copy of this report was sent to the requesting provider on this date.  Electronically Signed: Sandi Mariscal 04/26/2019, 9:48 AM   I spent a total of 25 Minutes in remote  clinical consultation, greater than 50% of which was counseling/coordinating care for splenic artery pseudoaneurysm and iatrogenic type B thoracic aortic dissection management.    Visit type: Audio only (telephone). Audio (no video) only due to patient's lack of internet/smartphone capability. Alternative for in-person consultation at [facility], [address], [place], [place]. This visit type was conducted due to national recommendations for restrictions regarding the COVID-19 Pandemic (e.g. social distancing).  This format is felt to be most appropriate for this patient at this time.  All issues noted in this document were discussed and addressed.

## 2019-05-15 ENCOUNTER — Ambulatory Visit
Admission: RE | Admit: 2019-05-15 | Discharge: 2019-05-15 | Disposition: A | Discharge status: Home / Self Care | Payer: Medicare Other | Source: Ambulatory Visit | Attending: Interventional Radiology | Admitting: Interventional Radiology

## 2019-05-15 ENCOUNTER — Encounter: Payer: Self-pay | Admitting: *Deleted

## 2019-05-15 ENCOUNTER — Other Ambulatory Visit: Payer: Self-pay

## 2019-05-15 DIAGNOSIS — I729 Aneurysm of unspecified site: Secondary | ICD-10-CM

## 2019-05-15 HISTORY — PX: IR RADIOLOGIST EVAL & MGMT: IMG5224

## 2019-05-15 NOTE — Progress Notes (Signed)
Patient ID: Megan Mercer, female   DOB: [birth year], 84 y.o.   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  As such, intervention was not performed at that time.  Unfortunately, the patient had a prolonged recovery including inability to maintain an adequate heart rate and transient episodes of hypertension requiring prolonged hospitalization at [facility] as well as subsequent discharge with subsequent readmission at her local hospital in [place].  Patient was seen in follow-up consultation on 04/26/2019 following acquisition of postprocedural dissection protocol CT scan of the chest, abdomen and pelvis which unfortunately demonstrated 2 contained pseudoaneurysms involving the mid and distal aspects of the descending thoracic aorta as well as a slight increase in size of the known splenic artery pseudoaneurysm.  At the time of the last consultation, the patient has been scheduled to obtain a second opinion from a [place] vascular surgeon, Dr. Gari Crown which occurred on 05/10/2019.  Patient is seen in repeat consultation today to formulate a definitive plan of care.   Past Medical History:  Diagnosis Date  . Anemia   . Anxiety   . Arthritis   . Atrial fibrillation ([place])   . Barrett's esophagus   . Blood transfusion without reported diagnosis   . Complication of anesthesia   .  Dysrhythmia   . GERD (gastroesophageal reflux disease)   . Glaucoma   . Hiatal hernia   . HLD (hyperlipidemia)   . HTN (hypertension)   . Hypothyroidism   . Internal hemorrhoids   . Iron deficiency anemia   . Pancreatic cyst   . Peptic ulcer disease with hemorrhage   . PONV (postoperative nausea and vomiting)    Confusion, Combativeness  . UTI (urinary tract infection)     Past Surgical History:  Procedure Laterality Date  . ABDOMINAL HYSTERECTOMY  1984  . APPENDECTOMY  2007  . CATARACT EXTRACTION Bilateral   . CHOLECYSTECTOMY  2007  . COLONOSCOPY    . ESOPHAGOGASTRODUODENOSCOPY (EGD) WITH PROPOFOL N/A 11/30/2018   Procedure: ESOPHAGOGASTRODUODENOSCOPY (EGD) WITH PROPOFOL;  Surgeon: Milus Banister, MD;  Location: [facility] END[facility]COPY;  Service: Endoscopy;  Laterality: N/A;  . EUS N/A 11/30/2018   Procedure: UPPER ENDOSCOPIC ULTRASOUND (EUS) RADIAL;  Surgeon: Milus Banister, MD;  Location: [facility];  Service: Endoscopy;  Laterality: N/A;  . IR EMBO ARTERIAL NOT HEMORR HEMANG INC GUIDE ROADMAPPING  12/27/2018  . IR RADIOLOGIST EVAL & MGMT  12/14/2018  . IR RADIOLOGIST EVAL & MGMT  04/05/2019  . IR RADIOLOGIST EVAL & MGMT  04/26/2019  . IR US GUIDE VASC ACCESS RIGHT  12/27/2018  . UPPER GASTROINTESTINAL ENDOSCOPY      Allergies: Ace inhibitors, Amoxicillin, Aspirin, Atorvastatin, Crestor  [rosuvastatin calcium], Iodine, Lipitor  [atorvastatin calcium], Oxycodone, Oxycodone-acetaminophen, Paroxetine hcl, Sulfa antibiotics, Sulfasalazine, and Codeine  Medications: Prior to Admission medications   Medication Sig Start Date End Date Taking? Authorizing Provider  apixaban (ELIQUIS) 2.5 MG TABS tablet Take 2.5 mg by mouth 2 (two) times daily.     [provider]  Calcium  Carb-Cholecalciferol (CALCIUM-VITAMIN D) 600-400 MG-UNIT TABS Take 1 tablet by mouth daily.    [provider]  clopidogrel (PLAVIX) 75 MG tablet Take 1 tablet (75 mg total) by mouth daily. 01/05/19    Charolotte Capuchin, MD  colestipol (COLESTID) 1 G tablet Take 2 g by mouth 2 (two) times daily.     [provider]  ezetimibe (ZETIA) 10 MG tablet Take 10 mg by mouth daily.    [provider]  folic acid (FOLVITE) Q000111Q MCG tablet Take 800 mcg by mouth daily.     [provider]  latanoprost (XALATAN) 0.005 % ophthalmic solution Place 1 drop into both eyes at bedtime. 08/11/18   [provider]  levothyroxine (SYNTHROID, LEVOTHROID) 75 MCG tablet Take 75 mcg by mouth daily before breakfast.    [provider]  metoprolol tartrate (LOPRESSOR) 100 MG tablet Take 1 tablet (100 mg total) by mouth 2 (two) times daily. 01/04/19   Charolotte Capuchin, MD  omeprazole (PRILOSEC) 40 MG capsule TAKE 1 CAPSULE TWICE A DAY (NEED OFFICE VISIT FOR FURTHER REFILLS) Patient taking differently: Take 40 mg by mouth daily.  11/13/18   Pyrtle, Lajuan Lines, MD  propafenone (RYTHMOL) 225 MG tablet Take 1 tablet (225 mg total) by mouth every 8 (eight) hours. 01/04/19   Charolotte Capuchin, MD  raloxifene (EVISTA) 60 MG tablet Take 60 mg by mouth at bedtime.     [provider]  timolol (BETIMOL) 0.5 % ophthalmic solution Place 1 drop into both eyes daily.    [provider]     Family History  Problem Relation Age of Onset  . Cancer Father        blood cancer, ? type  . Rheum arthritis Father   . Glaucoma Father   . Rheum arthritis Mother   . Arthritis Sister   . Diabetes Sister        TYPE 2  . Heart failure Sister   . Hyperlipidemia Sister   . Colon cancer Neg Hx   . Esophageal cancer Neg Hx   . Pancreatic cancer Neg Hx   . Stomach cancer Neg Hx   . Liver disease Neg Hx     Social History   Socioeconomic History  . Marital status: Single    Spouse name: Not on file  . Number of children: 0  . Years of education: Not on file  . Highest education level: Not on file  Occupational History  . Occupation: RETIRED  Tobacco Use  . Smoking status: Never  Smoker  . Smokeless tobacco: Never Used  Substance and Sexual Activity  . Alcohol use: Yes    Alcohol/week: 0.0 standard drinks    Comment: rare  . Drug use: No  . Sexual activity: Not on file  Other Topics Concern  . Not on file  Social History Narrative  . Not on file   Social Determinants of Health   Financial Resource Strain:   . Difficulty of Paying Living Expenses:   Food Insecurity:   . Worried About Charity fundraiser in the Last Year:   . Arboriculturist in the Last Year:   Transportation Needs:   . Film/video editor (Medical):   Marland Kitchen Lack of Transportation (Non-Medical):   Physical Activity:   . Days of Exercise per Week:   . Minutes of Exercise per Session:   Stress:   . Feeling of Stress :   Social Connections:   . Frequency of Communication with Friends  and Family:   . Frequency of Social Gatherings with Friends and Family:   . Attends Religious Services:   . Active Member of Clubs or Organizations:   . Attends Archivist Meetings:   Marland Kitchen Marital Status:     ECOG Status: 1 - Symptomatic but completely ambulatory  Review of Systems  Review of Systems: A 12 point ROS discussed and pertinent positives are indicated in the HPI above.  All other systems are negative.  Physical Exam No direct physical exam was performed (except for noted visual exam findings with Video Visits).   Vital Signs: There were no vitals taken for this visit.  Imaging: CT Angio Chest/Abd/Pel for Dissection W and/or W/WO  Addendum Date: 04/25/2019   ADDENDUM REPORT: 04/25/2019 08:21 ADDENDUM: Enlargement of the descending thoracic aorta at the areas of focal dissection or pseudoaneurysm. Descending thoracic aorta now measures up to 3.8 cm. Electronically Signed   By: Markus Daft M.D.   On: 04/25/2019 08:21   Result Date: 04/25/2019 CLINICAL DATA:  84 year old with splenic artery pseudoaneurysm. History of type B aortic dissection related to arterial catheterization. EXAM:  CT ANGIOGRAPHY CHEST, ABDOMEN AND PELVIS TECHNIQUE: Multidetector CT imaging through the chest, abdomen and pelvis was performed using the standard protocol during bolus administration of intravenous contrast. Multiplanar reconstructed images and MIPs were obtained and reviewed to evaluate the vascular anatomy. CONTRAST:  158mL OMNIPAQUE IOHEXOL 350 MG/ML SOLN COMPARISON:  12/27/2018 FINDINGS: CTA CHEST FINDINGS Cardiovascular: Ascending thoracic aorta measures 3.2 cm and stable. No evidence for dissection involving the ascending thoracic aorta. Common trunk for the brachiocephalic artery and left common carotid artery. Left subclavian artery is patent. No dissection involving the great vessels. Proximal vertebral arteries are patent bilaterally. Proximal descending thoracic aorta has decreased in size measuring 3.1 cm and previously measured up to 3.6 cm. No definite false lumen or dissection involving the proximal descending thoracic aorta. Mid descending thoracic aorta measures 2.8 cm and previously measured 2.9 cm. Aortic dissection involving the mid/distal descending thoracic aorta. Mid/distal descending thoracic aorta measures up to 3.3 cm and previously measured 2.8 cm. Enlargement of the distal descending thoracic aorta with a focal dissection or pseudoaneurysm. The distal descending thoracic aorta measures up to 3.8 cm on sequence 10, image 61 and previously measured 3.0 cm. Heart is prominent for size with no significant pericardial fluid. Mediastinum/Nodes: No mediastinal or hilar lymphadenopathy. No axillary lymph node enlargement. Lungs/Pleura: Dependent densities in both lungs and there is pleural thickening or trace pleural fluid. Trachea and mainstem bronchi are patent. Stable 6 mm nodule in the right upper lobe near the right minor fissure on sequence 8 image 75. Additional small nodular densities in the periphery of the right upper lobe on series 8, image 76 and 72. Stable nodular densities along  the right minor fissure on sequence 8, image 81. A new pleural-based nodular density in the medial anterior right upper lobe on image 77 that measures up to 7 mm. Additional small pleural-based densities in the medial right upper lung could represent postinflammatory changes. Again noted are small irregular nodular densities in the medial left upper lobe that are similar to the previous examination. Index nodule on sequence 8, image 656 measures 5 mm and stable. Musculoskeletal: No acute bone abnormality. Review of the MIP images confirms the above findings. CTA ABDOMEN AND PELVIS FINDINGS VASCULAR Aorta: Aortic dissection appears to resolve at the aortic hiatus. There is no significant dissection identified in the abdominal aorta. Abdominal aorta is patent with  atherosclerotic calcifications but no significant stenosis. Celiac: Significant narrowing of the celiac trunk compared to the prior CTA images. Celiac trunk is patent. There is flow in the left gastric artery and common hepatic artery. The splenic artery remains patent with a very tortuous course. Again noted is a large aneurysm or pseudoaneurysm coming off a distal splenic artery branch near the splenic hilum. Majority of this aneurysm contains thrombus but there is arterial flow within the aneurysm. The aneurysm measures 4.7 x 4.5 x 4.8 cm and measured 4.6 x 4.4 x 4.2 cm on 12/27/2018. The splenic artery proximal to the aneurysm is very small but patent. SMA: SMA is patent. Question a small dissection involving the proximal aspect of the SMA. Main SMA branches are patent. Renals: Bilateral renal arteries are patent without aneurysm or dissection. IMA: IMA is patent.  No evidence for dissection involving the IMA. Inflow: There is no clear evidence for a dissection involving the right common iliac artery. 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 The mid and distal right external iliac artery are  patent without definite dissection. Left common iliac artery measures 1.5 cm and minimally changed. No dissection or significant stenosis involving the left iliac arteries. The common, internal and external iliac arteries are patent bilaterally. Proximal femoral arteries are patent bilaterally. Veins: No obvious venous abnormality within the limitations of this arterial phase study. 1 Review of the MIP images confirms the above findings. NON-VASCULAR Hepatobiliary: Cholecystectomy.  No acute abnormality to the liver. Pancreas: Stable appearance of the pancreas without inflammatory changes. Again noted is mild dilatation of the main pancreatic duct. Distal common bile duct is prominent measuring 0.6 cm and stable. Spleen: Normal in size without focal abnormality. Adrenals/Urinary Tract: Left adrenal gland appears normal. Right adrenal gland is not well visualized. 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 Again noted is a prominent right renal cyst that measures roughly 4.5 cm. No suspicious renal lesion. Probable 0.3 cm right kidney stone. Negative for hydronephrosis. Stomach/Bowel: Probable small hiatal hernia. Large amount of stool throughout the colon. No evidence for bowel obstruction or focal bowel inflammation. Lymphatic: No significant lymph node enlargement in the abdomen or pelvis. Reproductive: Status post hysterectomy. No adnexal masses. Other: Small amount of pelvic ascites. Ascites appears to be low density. There is no significant abdominal ascites. Negative for free air. There is a stable subcutaneous low-density structure in the left lower anterior abdominal soft tissues. This subcutaneous nodular structure measures up to 1.4 cm and minimally changed. Musculoskeletal: Levoscoliosis in the lumbar spine. 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 Review of the MIP images confirms the above findings. IMPRESSION: Vascular: 1. Interval change in the aortic dissection. Dissection has  essentially resolved in the proximal descending thoracic aorta and abdominal aorta. Residual aortic dissection involving the mid and distal descending thoracic aorta. Residual aortic dissection appears to be complex with two focal dissection segments with contrast in the false lumens. These areas of aortic dissection could be evolving into focal pseudoaneurysm formations. 2. Persistent dissection involving the proximal right external iliac artery. No significant dissection involving the right common iliac artery. 3. Slight enlargement of the large pseudoaneurysm involving a distal splenic artery branch. Pseudoaneurysm measures 4.7 x 4.5 x 4.8 cm and measured 4.6 x 4.4 x 4.2 cm on 12/27/2018. Splenic artery anatomy is complex due to severe tortuosity. 4. Marked narrowing of the celiac artery trunk likely related to prior dissection. 5. Cannot exclude a small  dissection involving the proximal SMA. SMA and main branches are patent. Non vascular: 1. Bilateral pulmonary nodules. Majority of the pulmonary nodules are stable but there is a new 6 mm nodule along the anterior right upper lobe that could be postinflammatory but indeterminate. Non-contrast chest CT at 3-6 months is recommended. If the nodules are stable at time of repeat CT, then future CT at 18-24 months (from today's scan) is considered optional for low-risk patients, but is recommended for high-risk patients. This recommendation follows the consensus statement: Guidelines for Management of Incidental Pulmonary Nodules Detected on CT Images: From the Fleischner Society 2017; Radiology 2017; 284:228-243. 2. Pleural thickening and/or trace pleural fluid bilaterally. 3. New trace ascites in the pelvis.  Etiology is unknown. Electronically Signed: By: Markus Daft M.D. On: 04/24/2019 17:32   IR Radiologist Eval & Mgmt  Result Date: 04/26/2019 Please refer to notes tab for details about interventional procedure. (Op Note)   Labs:  CBC: Recent Labs     01/01/19 0230 01/02/19 1038 01/03/19 0214 01/04/19 0300  WBC 11.7* 12.1* 10.5 9.5  HGB 11.8* 12.3 11.3* 11.4*  HCT 35.6* 36.5 34.5* 34.4*  PLT 592* 741* 704* 829*    COAGS: No results for input(s): INR, APTT in the last 8760 hours.  BMP: Recent Labs    01/01/19 0230 01/01/19 0230 01/02/19 1038 01/03/19 0214 01/04/19 0300 04/24/19 1117  NA 130*  --  129* 133* 131*  --   K 4.0  --  4.4 4.5 4.2  --   CL 100  --  98 104 102  --   CO2 20*  --  21* 20* 20*  --   GLUCOSE 102*  --  135* 95 93  --   BUN 20  --  16 14 12   --   CALCIUM 8.5*  --  8.4* 8.2* 8.5*  --   CREATININE 0.85   < > 0.75 0.70 0.66 0.80  GFRNONAA >60  --  >60 >60 >60  --   GFRAA >60  --  >60 >60 >60  --    < > = values in this interval not displayed.    LIVER FUNCTION TESTS: Recent Labs    10/21/18 1341 12/30/18 0054 12/31/18 0238 01/01/19 0230  BILITOT 0.9 1.8* 1.7* 1.3*  AST 22 19 14* 17  ALT 17 13 10 13   ALKPHOS 66 45 52 50  PROT 7.4 6.2* 5.8* 6.0*  ALBUMIN 4.1 3.0* 2.6* 3.3*    TUMOR MARKERS: No results for input(s): AFPTM, CEA, CA199, CHROMGRNA in the last 8760 hours.  Assessment and Plan:  Megan Mercer is a 84 y.o. 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  Unfortunately, postprocedural dissection protocol CT scan of the chest, abdomen and pelvis performed 04/25/2019 demonstrates 2 contained pseudoaneurysms involving the mid and distal aspects of the descending thoracic aorta as well as a slight increase in size of the known splenic artery  pseudoaneurysm.  Patient is seen today via telemedicine following her outside consultation with [place] vascular surgeon, Dr. Gari Crown which occurred on 05/10/2019.  Patient tells me that Dr. Gari Crown is planning on repeating a CT scan within the [facility]  system on March 31 after which she will be seen again in consultation regarding planned intervention of the descending thoracic aortic dissection followed by splenic pseudoaneurysm embolization as she states that Dr. Gari Crown hopes to avoid performing a splenectomy given the morbidity associated with this operation.  I feel this plan of care is very reasonable though I did stress to the patient the importance of ensuring Dr. Gari Crown is aware the splenic artery pseudoaneurysm was NOT present on abdominal CT performed in 2017 and thus more timely intervention may be warranted, including potentially being treated at the time of the descending thoracic aortic stent graft repair.  Ultimately I feel the patient being treated at [facility] is very reasonable given her medical fragility and comorbidities and if percutaneous management of the splenic artery pseudoaneurysm is unsuccessful, higher potential for definitive operative management.  Again, condolences were provided to the patient regarding the complication suffered at the time of attempted intervention 12/27/2018 and I wished her the best of luck with her upcoming interventions.  The patient knows to call the interventional radiology clinic with any interval questions or concerns though may otherwise follow-up in a as needed basis.  A copy of this report was sent to the requesting provider on this date.  Electronically Signed: Sandi Mariscal 05/15/2019, 10:32 AM   I spent a total of 15 Minutes in remote  clinical consultation, greater than 50% of which was counseling/coordinating care for iatrogenic type B thoracic aortic dissection and splenic artery pseudoaneurysm.     Visit type: Audio only (telephone). Audio (no video) only due to patient's lack of internet/smartphone capability. Alternative for in-person consultation at [facility], [address], [place], [place]. This visit type was conducted due to national recommendations for restrictions regarding the COVID-19 Pandemic (e.g. social distancing).  This format is felt to be most appropriate for this patient at this time.  All issues noted in this document were discussed and addressed.

## 2019-12-26 ENCOUNTER — Emergency Department (HOSPITAL_COMMUNITY): Payer: Medicare Other

## 2019-12-26 ENCOUNTER — Emergency Department (HOSPITAL_COMMUNITY)
Admission: EM | Admit: 2019-12-26 | Discharge: 2019-12-26 | Disposition: A | Discharge status: Home / Self Care | Payer: Medicare Other | Attending: Emergency Medicine | Admitting: Emergency Medicine

## 2019-12-26 ENCOUNTER — Encounter (HOSPITAL_COMMUNITY): Payer: Self-pay | Admitting: Emergency Medicine

## 2019-12-26 ENCOUNTER — Other Ambulatory Visit: Payer: Self-pay

## 2019-12-26 DIAGNOSIS — E039 Hypothyroidism, unspecified: Secondary | ICD-10-CM | POA: Insufficient documentation

## 2019-12-26 DIAGNOSIS — Z7901 Long term (current) use of anticoagulants: Secondary | ICD-10-CM | POA: Diagnosis not present

## 2019-12-26 DIAGNOSIS — W010XXA Fall on same level from slipping, tripping and stumbling without subsequent striking against object, initial encounter: Secondary | ICD-10-CM | POA: Diagnosis not present

## 2019-12-26 DIAGNOSIS — I10 Essential (primary) hypertension: Secondary | ICD-10-CM | POA: Diagnosis not present

## 2019-12-26 DIAGNOSIS — S0992XA Unspecified injury of nose, initial encounter: Secondary | ICD-10-CM | POA: Insufficient documentation

## 2019-12-26 DIAGNOSIS — Z79899 Other long term (current) drug therapy: Secondary | ICD-10-CM | POA: Insufficient documentation

## 2019-12-26 DIAGNOSIS — R930 Abnormal findings on diagnostic imaging of skull and head, not elsewhere classified: Secondary | ICD-10-CM | POA: Diagnosis not present

## 2019-12-26 DIAGNOSIS — W19XXXA Unspecified fall, initial encounter: Secondary | ICD-10-CM

## 2019-12-26 MED ORDER — ACETAMINOPHEN 325 MG PO TABS
650.0000 mg | ORAL_TABLET | Freq: Once | ORAL | Status: AC
  Administered 2019-12-26: 650 mg via ORAL
  Filled 2019-12-26: qty 2

## 2019-12-26 NOTE — ED Provider Notes (Signed)
[facility] EMERGENCY DEPARTMENT Provider Note   CSN: [id number] Arrival date & time: 12/26/19  1308     History Chief Complaint  Patient presents with  . Fall    Megan Mercer is a 84 y.o. female who presents after fall.  She states that she was walking out from a store, tripped on the concrete and fell flat on her face, also hitting her knees.  States her nose began to bleed significantly, resolved at this time.  She reports scrapes to her nose, and her knees bilaterally, some soreness in her left knee.  Denies LOC, denies nausea, vomiting, dizziness, confusion, photosensitivity, blurry vision, double vision.  Patient is on chronic blood thinners with Eliquis for atrial fibrillation.   She states that she presented to urgent care immediately following the accident, they performed a urinalysis which was significant for infection, they prescribed her antibiotic therapy for this.  She does endorse some urinary frequency, otherwise does not have any urinary symptoms.  She denies any confusion, dizziness, lightheadedness prior to her fall today.  States that she caught her shoe on a crack in concrete before she fell.  They also performed a x-ray of each knee at the urgent care, which was negative per the patient.  She presents with a disc with the films on it, which we are unable to access here.  They encouraged her to present to the emergency department for CT of her head, as she fell and hit her face on the concrete and is chronically on anticoagulation with Eliquis for atrial fibrillation.  I personally reviewed her medical records.  She has history of atrial fibrillation chronically on Eliquis, GERD, Barrett's esophagus, hyperlipidemia, hypertension, hypothyroidism, peptic ulcer disease, deficiency anemia, hiatal hernia, glaucoma, internal hemorrhoids, pancreatic cyst.  She is history of appendectomy, cholecystectomy, abdominal hysterectomy.  HPI     Past Medical History:  Diagnosis  Date  . Anemia   . Anxiety   . Arthritis   . Atrial fibrillation ([place])   . Barrett's esophagus   . Blood transfusion without reported diagnosis   . Complication of anesthesia   . Dysrhythmia   . GERD (gastroesophageal reflux disease)   . Glaucoma   . Hiatal hernia   . HLD (hyperlipidemia)   . HTN (hypertension)   . Hypothyroidism   . Internal hemorrhoids   . Iron deficiency anemia   . Pancreatic cyst   . Peptic ulcer disease with hemorrhage   . PONV (postoperative nausea and vomiting)    Confusion, Combativeness  . UTI (urinary tract infection)     Patient Active Problem List   Diagnosis Date Noted  . 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 Dissection of ascending aorta ([place]) 12/27/2018  . Aortic dissection ([place]) 12/27/2018  . Abdominal aortic aneurysm ([place]) 12/27/2018  . Hypothyroidism 12/27/2018  . 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 Paroxysmal atrial fibrillation ([place]) 12/27/2018  . Chronic anticoagulation 12/27/2018  . Thrombocytosis 12/27/2018  . Lesion of pancreas   . Rectal bleeding 09/23/2016  . Internal hemorrhoid 09/23/2016  . Anemia, iron deficiency 05/14/2014    Past Surgical History:  Procedure Laterality Date  . ABDOMINAL HYSTERECTOMY  1984  . APPENDECTOMY  2007  . CATARACT EXTRACTION Bilateral   . CHOLECYSTECTOMY  2007  . COLONOSCOPY    . ESOPHAGOGASTRODUODENOSCOPY (EGD) WITH PROPOFOL N/A 11/30/2018   Procedure: ESOPHAGOGASTRODUODENOSCOPY (EGD) WITH PROPOFOL;  Surgeon: Milus Banister, MD;  Location: [facility] ENDOSCOPY;  Service: Endoscopy;  Laterality:  N/A;  . EUS N/A 11/30/2018   Procedure: UPPER ENDOSCOPIC ULTRASOUND (EUS) RADIAL;  Surgeon: Milus Banister, MD;  Location: [facility] ENDOSCOPY;  Service: Endoscopy;  Laterality: N/A;  . IR EMBO ARTERIAL NOT HEMORR HEMANG INC GUIDE ROADMAPPING  12/27/2018  . IR RADIOLOGIST EVAL & MGMT  12/14/2018  . IR RADIOLOGIST EVAL & MGMT  04/05/2019  . IR RADIOLOGIST EVAL & MGMT   04/26/2019  . IR RADIOLOGIST EVAL & MGMT  05/15/2019  . IR US GUIDE VASC ACCESS RIGHT  12/27/2018  . UPPER GASTROINTESTINAL ENDOSCOPY       OB History   No obstetric history on file.     Family History  Problem Relation Age of Onset  . Cancer Father        blood cancer, ? type  . Rheum arthritis Father   . Glaucoma Father   . Rheum arthritis Mother   . Arthritis Sister   . Diabetes Sister        TYPE 2  . Heart failure Sister   . Hyperlipidemia Sister   . Colon cancer Neg Hx   . Esophageal cancer Neg Hx   . Pancreatic cancer Neg Hx   . Stomach cancer Neg Hx   . Liver disease Neg Hx     Social History   Tobacco Use  . Smoking status: Never Smoker  . Smokeless tobacco: Never Used  Vaping Use  . Vaping Use: Never used  Substance Use Topics  . Alcohol use: Yes    Alcohol/week: 0.0 standard drinks    Comment: rare  . Drug use: No    Home Medications Prior to Admission medications   Medication Sig Start Date End Date Taking? Authorizing Provider  apixaban (ELIQUIS) 2.5 MG TABS tablet Take 2.5 mg by mouth 2 (two) times daily.     [provider]  Calcium Carb-Cholecalciferol (CALCIUM-VITAMIN D) 600-400 MG-UNIT TABS Take 1 tablet by mouth daily.    [provider]  clopidogrel (PLAVIX) 75 MG tablet Take 1 tablet (75 mg total) by mouth daily. 01/05/19   Charolotte Capuchin, MD  colestipol (COLESTID) 1 G tablet Take 2 g by mouth 2 (two) times daily.     [provider]  ezetimibe (ZETIA) 10 MG tablet Take 10 mg by mouth daily.    [provider]  folic acid (FOLVITE) 700 MCG tablet Take 800 mcg by mouth daily.     [provider]  latanoprost (XALATAN) 0.005 % ophthalmic solution Place 1 drop into both eyes at bedtime. 08/11/18   [provider]  levothyroxine (SYNTHROID, LEVOTHROID) 75 MCG tablet Take 75 mcg by mouth daily before breakfast.    [provider]  metoprolol tartrate (LOPRESSOR) 100 MG tablet Take 1  tablet (100 mg total) by mouth 2 (two) times daily. 01/04/19   Charolotte Capuchin, MD  omeprazole (PRILOSEC) 40 MG capsule TAKE 1 CAPSULE TWICE A DAY (NEED OFFICE VISIT FOR FURTHER REFILLS) Patient taking differently: Take 40 mg by mouth daily.  11/13/18   Pyrtle, Lajuan Lines, MD  propafenone (RYTHMOL) 225 MG tablet Take 1 tablet (225 mg total) by mouth every 8 (eight) hours. 01/04/19   Charolotte Capuchin, MD  raloxifene (EVISTA) 60 MG tablet Take 60 mg by mouth at bedtime.     [provider]  timolol (BETIMOL) 0.5 % ophthalmic solution Place 1 drop into both eyes daily.    [provider]    Allergies    Ace inhibitors, Amoxicillin, Aspirin, Atorvastatin, Crestor  [employer]  calcium], Iodine, Lipitor  [atorvastatin calcium], Oxycodone, Oxycodone-acetaminophen, Paroxetine hcl, Sulfa antibiotics, Sulfasalazine, and Codeine  Review of Systems   Review of Systems  Constitutional: Negative.   HENT: Positive for nosebleeds. Negative for trouble swallowing.   Eyes: Negative for photophobia and visual disturbance.  Respiratory: Negative for cough, chest tightness and shortness of breath.   Cardiovascular: Negative for chest pain, palpitations and leg swelling.  Gastrointestinal: Negative for abdominal pain, diarrhea, nausea and vomiting.  Genitourinary: Positive for dysuria and frequency. Negative for hematuria and urgency.  Musculoskeletal: Positive for arthralgias. Negative for joint swelling.       Bilateral knee pain L>R  Skin: Positive for wound.       Abrasion to the nose, abrasions on bilateral knees.  Allergic/Immunologic: Negative.   Neurological: Positive for weakness. Negative for dizziness, seizures, syncope, light-headedness and headaches.  Hematological: Bruises/bleeds easily.       Chronically on Eliquis for atrial fibrillation.    Physical Exam Updated Vital Signs BP 133/70   Pulse 65   Temp 97.9 F (36.6 C) (Oral)   Resp 19   Ht 5\' 4"  (1.626 m)   Wt 52.6 kg    SpO2 92%   BMI 19.91 kg/m   Physical Exam Vitals and nursing note reviewed.  HENT:     Head: Normocephalic. No raccoon eyes, Battle's sign or laceration.     Jaw: No swelling or pain on movement.     Right Ear: Tympanic membrane, ear canal and external ear normal. No hemotympanum.     Left Ear: Tympanic membrane, ear canal and external ear normal. No hemotympanum.     Nose: Signs of injury present. No septal deviation.     Right Nostril: No septal hematoma.     Left Nostril: No septal hematoma.      Comments: Dried blood in the nares bilaterally, without septal hematoma.    Mouth/Throat:     Mouth: Mucous membranes are moist.     Tongue: Tongue does not deviate from midline.     Pharynx: Oropharynx is clear. Uvula midline. No oropharyngeal exudate or posterior oropharyngeal erythema.     Comments: No bleeding or injury of the oropharynx. Eyes:     General:        Right eye: No discharge.        Left eye: No discharge.     Extraocular Movements: Extraocular movements intact.     Conjunctiva/sclera: Conjunctivae normal.     Pupils: Pupils are equal, round, and reactive to light.  Cardiovascular:     Rate and Rhythm: Normal rate and regular rhythm.     Pulses: Normal pulses.     Heart sounds: Normal heart sounds. No murmur heard.   Pulmonary:     Effort: Pulmonary effort is normal. No respiratory distress.     Breath sounds: Normal breath sounds. No wheezing or rales.  Abdominal:     General: There is no distension.     Palpations: Abdomen is soft.     Tenderness: There is no abdominal tenderness. There is no guarding or rebound.  Musculoskeletal:        General: No deformity.     Right shoulder: Normal.     Left shoulder: Normal.     Right upper arm: Normal.     Left upper arm: Normal.     Right elbow: Normal.     Left elbow: Normal.     Right forearm: Normal.     Left forearm: Normal.  Right wrist: Normal.     Left wrist: Normal.     Right hand: Normal.      Left hand: Normal.     Cervical back: Normal range of motion and neck supple. No signs of trauma, rigidity or tenderness. No pain with movement, spinous process tenderness or muscular tenderness.     Thoracic back: Normal. No tenderness or bony tenderness.     Lumbar back: Normal. No tenderness or bony tenderness.     Right hip: Normal.     Left hip: Normal.     Right upper leg: Normal.     Left upper leg: Normal.     Right knee: No swelling, deformity, ecchymosis, lacerations or bony tenderness. Normal range of motion. No tenderness.     Left knee: No swelling, deformity, erythema, ecchymosis or lacerations. Normal range of motion. Tenderness present.     Right lower leg: Normal. No edema.     Left lower leg: Normal. No edema.     Right ankle: Normal.     Right Achilles Tendon: Normal.     Left ankle: Normal.     Left Achilles Tendon: Normal.     Right foot: Normal.     Left foot: Normal.       Legs:     Comments: Mild tenderness left popliteal fossa, without erythema, edema, ecchymosis.  Lymphadenopathy:     Cervical: No cervical adenopathy.  Skin:    General: Skin is warm and dry.     Capillary Refill: Capillary refill takes less than 2 seconds.  Neurological:     General: No focal deficit present.     Mental Status: She is alert and oriented to person, place, and time.     Cranial Nerves: Cranial nerves are intact.     Sensory: Sensation is intact.     Motor: Motor function is intact.     Coordination: Coordination is intact.     Gait: Gait is intact.  Psychiatric:        Mood and Affect: Mood normal.     ED Results / Procedures / Treatments   Labs (all labs ordered are listed, but only abnormal results are displayed) Labs Reviewed - No data to display  EKG None  Radiology CT Head Wo Contrast  Result Date: 12/26/2019 CLINICAL DATA:  Head trauma, minor. Fall on anticoagulation. Neck trauma. EXAM: CT HEAD WITHOUT CONTRAST CT CERVICAL SPINE WITHOUT CONTRAST  TECHNIQUE: Multidetector CT imaging of the head and cervical spine was performed following the standard protocol without intravenous contrast. Multiplanar CT image reconstructions of the cervical spine were also generated. COMPARISON:  CT head/cervical spine 10/21/2018. FINDINGS: CT HEAD FINDINGS Brain: Cerebral volume is normal. Mild ill-defined hypoattenuation within the cerebral white matter is nonspecific, but compatible with chronic small vessel ischemic disease. There is no acute intracranial hemorrhage. No demarcated cortical infarct. No extra-axial fluid collection. No evidence of intracranial mass. No midline shift. Vascular: No hyperdense vessel.  Atherosclerotic calcifications. Skull: Normal. Negative for fracture or focal lesion. Sinuses/Orbits: Visualized orbits show no acute finding. Small right maxillary sinus mucous retention cyst. CT CERVICAL SPINE FINDINGS Alignment: Straightening of the expected cervical lordosis. Trace C5-C6 grade 1 retrolisthesis. 2 mm C7-T1 grade 1 anterolisthesis, unchanged. Skull base and vertebrae: The basion-dental and atlanto-dental intervals are maintained.No evidence of acute fracture to the cervical spine. Soft tissues and spinal canal: No prevertebral fluid or swelling. No visible canal hematoma. Disc levels: Cervical spondylosis. Most notably at C5-C6 and C6-C7, there is advanced  disc space narrowing with posterior disc osteophytes and uncovertebral hypertrophy. Bilateral neural foraminal narrowing and at least mild spinal canal stenosis at these levels. Upper chest: No consolidation within the imaged lung apices. No visible pneumothorax. Other: 7 mm rounded focus of calcification within the posterolateral spinal canal at the T2-T3 level (series 7, image 84). Resultant at least mild spinal canal stenosis at this level. IMPRESSION: CT head: 1. No evidence of acute intracranial abnormality. 2. Mild chronic small vessel ischemic disease. 3. Small right maxillary sinus  mucous retention cyst. CT cervical spine: 1. No evidence of acute fracture to the cervical spine. 2. Trace C5-C6 grade 1 retrolisthesis. 2 mm C7-T1 grade 1 retrolisthesis. Findings are unchanged and likely degenerative. 3. Cervical spondylosis as described and greatest at C5-C6 and C6-C7. 4. 7 mm rounded focus of calcification within the posterolateral spinal canal at the T2-T3 level. This may reflect an incidental small meningioma or ligamentum flavum calcification. Resultant at least mild spinal canal stenosis at this level. Electronically Signed   By: Kellie Simmering DO   On: 12/26/2019 15:12   CT Cervical Spine Wo Contrast  Result Date: 12/26/2019 CLINICAL DATA:  Head trauma, minor. Fall on anticoagulation. Neck trauma. EXAM: CT HEAD WITHOUT CONTRAST CT CERVICAL SPINE WITHOUT CONTRAST TECHNIQUE: Multidetector CT imaging of the head and cervical spine was performed following the standard protocol without intravenous contrast. Multiplanar CT image reconstructions of the cervical spine were also generated. COMPARISON:  CT head/cervical spine 10/21/2018. FINDINGS: CT HEAD FINDINGS Brain: Cerebral volume is normal. Mild ill-defined hypoattenuation within the cerebral white matter is nonspecific, but compatible with chronic small vessel ischemic disease. There is no acute intracranial hemorrhage. No demarcated cortical infarct. No extra-axial fluid collection. No evidence of intracranial mass. No midline shift. Vascular: No hyperdense vessel.  Atherosclerotic calcifications. Skull: Normal. Negative for fracture or focal lesion. Sinuses/Orbits: Visualized orbits show no acute finding. Small right maxillary sinus mucous retention cyst. CT CERVICAL SPINE FINDINGS Alignment: Straightening of the expected cervical lordosis. Trace C5-C6 grade 1 retrolisthesis. 2 mm C7-T1 grade 1 anterolisthesis, unchanged. Skull base and vertebrae: The basion-dental and atlanto-dental intervals are maintained.No evidence of acute fracture  to the cervical spine. Soft tissues and spinal canal: No prevertebral fluid or swelling. No visible canal hematoma. Disc levels: Cervical spondylosis. Most notably at C5-C6 and C6-C7, there is advanced disc space narrowing with posterior disc osteophytes and uncovertebral hypertrophy. Bilateral neural foraminal narrowing and at least mild spinal canal stenosis at these levels. Upper chest: No consolidation within the imaged lung apices. No visible pneumothorax. Other: 7 mm rounded focus of calcification within the posterolateral spinal canal at the T2-T3 level (series 7, image 84). Resultant at least mild spinal canal stenosis at this level. IMPRESSION: CT head: 1. No evidence of acute intracranial abnormality. 2. Mild chronic small vessel ischemic disease. 3. Small right maxillary sinus mucous retention cyst. CT cervical spine: 1. No evidence of acute fracture to the cervical spine. 2. Trace C5-C6 grade 1 retrolisthesis. 2 mm C7-T1 grade 1 retrolisthesis. Findings are unchanged and likely degenerative. 3. Cervical spondylosis as described and greatest at C5-C6 and C6-C7. 4. 7 mm rounded focus of calcification within the posterolateral spinal canal at the T2-T3 level. This may reflect an incidental small meningioma or ligamentum flavum calcification. Resultant at least mild spinal canal stenosis at this level. Electronically Signed   By: Kellie Simmering DO   On: 12/26/2019 15:12    Procedures Procedures (including critical care time)  Medications Ordered in ED Medications  acetaminophen (TYLENOL) tablet 650 mg (650 mg Oral Given 12/26/19 1411)    ED Course  I have reviewed the triage vital signs and the nursing notes.  Pertinent labs & imaging results that were available during my care of the patient were reviewed by me and considered in my medical decision making (see chart for details).    MDM Rules/Calculators/A&P                         84 year old patient with mechanical fall on anticoagulation  this morning.  Seen at urgent care immediately after the incident, with reportedly negative bilateral knee x-rays, urinalysis positive for infection.  Patient was discharged with antibiotic prescription, encouraged to present to the ED for CT scan of her head as she is on chronic anticoagulation with Eliquis for atrial fibrillation.   Vital signs on intake are very reassuring.  Physical exam also reassuring.  Patient is alert, cognitively intact, normal neuro exam.  She has minor abrasions to the bridge of her nose, as well as her knees bilaterally.  We will proceed with CT scan of the head and cervical spine.  Case discussed with attending physician Dr. Sedonia Small, who also saw the patient; feel we do not need to proceed with CT maxillofacial at this time.   I do not feel we need to repeat the plain films of the patient's left knee, as she is able to ambulate independently, and she verbally confirmed that there were no fractures or dislocations on her x-ray.  Tylenol offered for pain.  CT head without evidence of acute intracranial abnormality, mild chronic small vessel ischemic disease.  Small right maxillary sinus mucous retention cyst.  CT cervical spine without evidence of acute fracture.  Degenerative changes in the cervical spine, 7 mm rounded focus of calcification within the posterior lateral spinal canal at T2-T3, which may reflect an incidental small meningioma or ligamentum flavum calcification.  Resultant mild spinal canal stenosis at this level.  Patient was informed of incidental finding of 7 mm meningioma.  This is not an emergent issue. Encouraged follow-up with her primary care doctor.  She was provided with paper copy of her CT results.  At this time with reassuring CT imaging and physical exam, I do feel a further work-up is necessary in the emergency department.  [place] voiced understanding of her medical evaluation and treatment plan.  Each of her questions were answered to her  expressed dissection.  Strict return precautions were given.  Patient stable for discharge.  Final Clinical Impression(s) / ED Diagnoses Final diagnoses:  Fall, initial encounter    Rx / DC Orders ED Discharge Orders    None       Aura Dials 12/26/19 1635    Maudie Flakes, MD 12/31/19 [phone]

## 2019-12-26 NOTE — Discharge Instructions (Signed)
You were seen in the emergency department today for evaluation after your fall.  We did a CT scan of your head and your neck because you are on blood thinners.  There is no evidence of bleeding in your brain, or broken bones in your head or neck.  This is very good news. There was an incidental finding of a 7 mm meningioma in your spinal canal, please make your primary care provider aware of this so that they may follow it closely.  It is not an emergent issue.  You have a few abrasions to your nose, and to your knees on both sides.  You may apply antibiotic ointment to these areas.  You can expect to bruise and become more sore over the next few days, you may take Tylenol as needed for your pain.  Please complete the antibiotic that was prescribed to you for your urinary tract infection at the urgent care.  Please follow-up with your primary care provider in 1 week for recheck of your urine, as well as for reevaluation of how you are feeling after this fall.  Please return immediately to emergency department develop any new dizziness, severe headaches, confusion, dizziness, blurred vision, double vision, or other new severe symptoms.

## 2019-12-26 NOTE — ED Triage Notes (Signed)
Pt reports tripped and fell this am and reports left knee pain and facial pain. Abrasion noted to bridge of nose. Bleeding controlled. Pt on blood thinner. [facility] and reports brought cd from scans. Pt reports was sent over for CT scan of head. Pt ambulates with steady gait.

## 2019-12-27 ENCOUNTER — Ambulatory Visit: Payer: Medicare Other | Admitting: Physician Assistant

## 2020-01-07 ENCOUNTER — Other Ambulatory Visit (INDEPENDENT_AMBULATORY_CARE_PROVIDER_SITE_OTHER): Payer: Medicare Other

## 2020-01-07 ENCOUNTER — Ambulatory Visit (INDEPENDENT_AMBULATORY_CARE_PROVIDER_SITE_OTHER): Payer: Medicare Other | Admitting: Physician Assistant

## 2020-01-07 ENCOUNTER — Encounter: Payer: Self-pay | Admitting: Physician Assistant

## 2020-01-07 VITALS — BP 126/62 | HR 56 | Ht 63.0 in | Wt 120.1 lb

## 2020-01-07 DIAGNOSIS — R1013 Epigastric pain: Secondary | ICD-10-CM

## 2020-01-07 DIAGNOSIS — K219 Gastro-esophageal reflux disease without esophagitis: Secondary | ICD-10-CM

## 2020-01-07 DIAGNOSIS — D509 Iron deficiency anemia, unspecified: Secondary | ICD-10-CM

## 2020-01-07 DIAGNOSIS — R110 Nausea: Secondary | ICD-10-CM | POA: Diagnosis not present

## 2020-01-07 LAB — CBC WITH DIFFERENTIAL/PLATELET
Basophils Absolute: 0.1 10*3/uL (ref 0.0–0.1)
Basophils Relative: 1.3 % (ref 0.0–3.0)
Eosinophils Absolute: 0.3 10*3/uL (ref 0.0–0.7)
Eosinophils Relative: 4.5 % (ref 0.0–5.0)
HCT: 37.8 % (ref 36.0–46.0)
Hemoglobin: 12.2 g/dL (ref 12.0–15.0)
Lymphocytes Relative: 16.2 % (ref 12.0–46.0)
Lymphs Abs: 1.1 10*3/uL (ref 0.7–4.0)
MCHC: 32.3 g/dL (ref 30.0–36.0)
MCV: 78.6 fl (ref 78.0–100.0)
Monocytes Absolute: 1 10*3/uL (ref 0.1–1.0)
Monocytes Relative: 13.4 % — ABNORMAL HIGH (ref 3.0–12.0)
Neutro Abs: 4.6 10*3/uL (ref 1.4–7.7)
Neutrophils Relative %: 64.6 % (ref 43.0–77.0)
Platelets: 540 10*3/uL — ABNORMAL HIGH (ref 150.0–400.0)
RBC: 4.81 Mil/uL (ref 3.87–5.11)
RDW: 18.5 % — ABNORMAL HIGH (ref 11.5–15.5)
WBC: 7.1 10*3/uL (ref 4.0–10.5)

## 2020-01-07 LAB — COMPREHENSIVE METABOLIC PANEL
ALT: 19 U/L (ref 0–35)
AST: 20 U/L (ref 0–37)
Albumin: 3.8 g/dL (ref 3.5–5.2)
Alkaline Phosphatase: 79 U/L (ref 39–117)
BUN: 18 mg/dL (ref 6–23)
CO2: 25 mEq/L (ref 19–32)
Calcium: 8.5 mg/dL (ref 8.4–10.5)
Chloride: 103 mEq/L (ref 96–112)
Creatinine, Ser: 0.82 mg/dL (ref 0.40–1.20)
GFR: 66.51 mL/min (ref >60.00)
Glucose, Bld: 60 mg/dL — ABNORMAL LOW (ref 70–99)
Potassium: 4.2 mEq/L (ref 3.5–5.1)
Sodium: 135 mEq/L (ref 135–145)
Total Bilirubin: 0.7 mg/dL (ref 0.2–1.2)
Total Protein: 7 g/dL (ref 6.0–8.3)

## 2020-01-07 LAB — IBC + FERRITIN
Ferritin: 10.5 ng/mL (ref 10.0–291.0)
Iron: 27 ug/dL — ABNORMAL LOW (ref 42–145)
Saturation Ratios: 5.5 % — ABNORMAL LOW (ref 20.0–50.0)
Transferrin: 349 mg/dL (ref 212.0–360.0)

## 2020-01-07 LAB — FERRITIN: Ferritin: 10.5 ng/mL (ref 10.0–291.0)

## 2020-01-07 MED ORDER — OMEPRAZOLE 40 MG PO CPDR: 40.0000 mg | DELAYED_RELEASE_CAPSULE | Freq: Every day | ORAL | 11 refills | Status: DC

## 2020-01-07 NOTE — Progress Notes (Signed)
Subjective:    Patient ID: Megan Mercer, female    DOB: 01-Sep-1935, 84 y.o.   MRN: [id number]  HPI Megan Mercer is a pleasant 84 year old white female established with Dr. Hilarie Fredrickson who was last seen here in September 2020.  She comes in today with complaints of epigastric pain. Patient has multiple comorbidities, with known dissecting thoracic aneurysm (Stanford type B), atrial fibrillation, sick sinus syndrome, history of iron deficiency anemia, hypothyroidism, and GERD.  Also with remote peptic ulcer disease. 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  She was referred to interventional radiology and eventually underwent attempt at percutaneous management of the splenic artery pseudoaneurysm on December 27, 2018.  This procedure was complicated by creation of a false lumen and associated intramural hematoma/type B thoracic aortic dissection.  The intervention was not able to be completed. She had a prolonged recovery and hospitalization thereafter with development of rapid A. fib.  SHe is on Eliquis and Plavix currently. She was eventually referred to [facility] and underwent splenic artery embolization per vascular surgery there.  She is to have follow-up there in December with repeat CT angio of the chest and abdomen.  Patient says the pain that she comes in with today has been present long-term, dating significantly prior to all of the work-up she had last year.  She says sometimes this is worse when her stomach is empty and sometimes she hurts immediately after eating.  She describes it as a dull discomfort with occasional nausea without vomiting.  Appetite has been fair.  She says she did lose a good deal of weight with her hospitalization last winter but has gained all of that weight back.   Bowel movements are okay for her with use of a stool softener, no melena or hematochezia.  She says overall she still does not feel well has a poor energy level and has had significant shortness of breath with exertion. Last EGD 2016 with finding of short segment Barrett's, biopsies were negative for Barrett's, small hiatal hernia. Flex in 2018 pertinent only for internal hemorrhoids.   Review of Systems Pertinent positive and negative review of systems were noted in the above HPI section.  All other review of systems was otherwise negative.  Outpatient Encounter Medications as of 01/07/2020  Medication Sig  . amiodarone (PACERONE) 200 MG tablet Take 200 mg by mouth daily.  Marland Kitchen apixaban (ELIQUIS) 2.5 MG TABS tablet Take 2.5 mg by mouth 2 (two) times daily.   . Calcium Carb-Cholecalciferol (CALCIUM-VITAMIN D) 600-400 MG-UNIT TABS Take 1 tablet by mouth daily.  . clopidogrel (PLAVIX) 75 MG tablet Take 1 tablet (75 mg total) by mouth daily.  . colestipol (COLESTID) 1 G tablet Take 2 g by mouth 2 (two) times daily.   Marland Kitchen ezetimibe (ZETIA) 10 MG tablet Take 10 mg by mouth daily.  . folic acid (FOLVITE) 009 MCG tablet Take 800 mcg by mouth daily.   Marland Kitchen latanoprost (XALATAN) 0.005 % ophthalmic solution Place 1 drop into both eyes at bedtime.  Marland Kitchen levothyroxine (SYNTHROID, LEVOTHROID) 75 MCG tablet Take 75 mcg by mouth daily before breakfast.  . metoprolol tartrate (LOPRESSOR) 50 MG tablet Take 50 mg by mouth 2 (two) times daily.  . mirtazapine (REMERON) 15 MG tablet Take 15 mg by mouth at bedtime.  Marland Kitchen  propafenone (RYTHMOL) 225 MG tablet Take 1 tablet (225 mg total) by mouth every 8 (eight) hours.  . raloxifene (EVISTA) 60 MG tablet Take 60 mg by mouth at bedtime.   . timolol (BETIMOL) 0.5 % ophthalmic solution Place 1 drop into both eyes daily.  Marland Kitchen omeprazole (PRILOSEC) 40 MG capsule Take 1 capsule (40 mg total) by mouth daily.  . [DISCONTINUED] metoprolol tartrate (LOPRESSOR) 100 MG tablet Take 1 tablet (100  mg total) by mouth 2 (two) times daily.  . [DISCONTINUED] omeprazole (PRILOSEC) 40 MG capsule TAKE 1 CAPSULE TWICE A DAY (NEED OFFICE VISIT FOR FURTHER REFILLS) (Patient taking differently: Take 40 mg by mouth daily. )   No facility-administered encounter medications on file as of 01/07/2020.   Allergies  Allergen Reactions  . Ace Inhibitors Cough  . Amoxicillin Other (See Comments)    Did it involve swelling of the face/tongue/throat, SOB, or low BP? No Did it involve sudden or severe rash/hives, skin peeling, or any reaction on the inside of your mouth or nose? No Did you need to seek medical attention at a hospital or doctor's office? No When did it last happen? If all above answers are "NO", may proceed with cephalosporin use. 3  . Aspirin   . Atorvastatin Other (See Comments)    Other reaction(s): Arthralgia (Joint Pain) Other reaction(s): Arthralgia (Joint Pain)  . Crestor  [Rosuvastatin Calcium]   . Iodine Nausea Only  . Lipitor  [Atorvastatin Calcium]     Other reaction(s): Arthralgia (joint pain)  . Oxycodone Nausea And Vomiting  . Oxycodone-Acetaminophen Nausea And Vomiting  . Paroxetine Hcl Nausea Only    Intolerance= Nausea, Vomitting  . Sulfa Antibiotics Nausea And Vomiting  . Sulfasalazine Nausea And Vomiting  . Codeine Nausea And Vomiting   Patient Active Problem List   Diagnosis Date Noted  . Dissecting aneurysm of thoracic aorta, Stanford type B ([place]) 12/28/2018  . Dissection of ascending aorta ([place]) 12/27/2018  . Aortic dissection ([place]) 12/27/2018  . Abdominal aortic aneurysm ([place]) 12/27/2018  . Hypothyroidism 12/27/2018  . Aneurysm, 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[place]) 12/27/2018  . Paroxysmal atrial fibrillation ([place]) 12/27/2018  . Chronic anticoagulation 12/27/2018  . Thrombocytosis 12/27/2018  . Lesion of pancreas   . Rectal bleeding 09/23/2016  . Internal hemorrhoid 09/23/2016  . Anemia, iron deficiency 05/14/2014   Social History   Socioeconomic History    . Marital status: Single    Spouse name: Not on file  . Number of children: 0  . Years of education: Not on file  . Highest education level: Not on file  Occupational History  . Occupation: RETIRED  Tobacco Use  . Smoking status: Never Smoker  . Smokeless tobacco: Never Used  Vaping Use  . Vaping Use: Never used  Substance and Sexual Activity  . Alcohol use: Yes    Alcohol/week: 0.0 standard drinks    Comment: rare  . Drug use: No  . Sexual activity: Not on file  Other Topics Concern  . Not on file  Social History Narrative  . Not on file   Social Determinants of Health   Financial Resource Strain:   . Difficulty of Paying Living Expenses: Not on file  Food Insecurity:   . Worried About Charity fundraiser in the Last Year: Not on file  . Ran Out of Food in the Last Year: Not on file  Transportation Needs:   . Lack of Transportation (Medical): Not on file  . Lack of Transportation (Non-Medical): Not on file  Physical Activity:   . Days of Exercise per Week: Not on file  . Minutes of Exercise per Session: Not on file  Stress:   . Feeling of Stress : Not on file  Social Connections:   . Frequency of Communication with Friends and Family: Not on file  . Frequency of Social Gatherings with Friends and Family: Not on file  . Attends Religious Services: Not on file  . Active Member of Clubs or Organizations: Not on file  . Attends Archivist Meetings: Not on file  . Marital Status: Not on file  Intimate Partner Violence:   . Fear of Current or Ex-Partner: Not on file  . Emotionally Abused: Not on file  . Physically Abused: Not on file  . Sexually Abused: Not on file    Megan Mercer's family history includes Arthritis in her sister; Cancer in her father; Diabetes in her sister; Glaucoma in her father; Heart failure in her sister; Hyperlipidemia in her sister; Rheum arthritis in her father and mother.      Objective:    Vitals:   01/07/20 1006  BP: 126/62   Pulse: (!) 56    Physical Exam Well-developed well-nourished elderly white female in no acute distress.  Height, Weight, 120 BMI 21.2  HEENT; nontraumatic normocephalic, EOMI, PE RR LA, sclera anicteric. Oropharynx; not done today Neck; supple, no JVD Cardiovascular; regular rate and rhythm with S1-S2, no murmur rub or gallop Pulmonary; Clear bilaterally Abdomen; soft, nondistended, she is tender in the epigastrium no palpable mass or hepatosplenomegaly, bowel sounds are active Rectal; not done today Skin; benign exam, no jaundice rash or appreciable lesions Extremities; no clubbing cyanosis or edema skin warm and dry Neuro/Psych; alert and oriented x4, grossly nonfocal mood and affect appropriate       Assessment & Plan:   #33 84 year old white female with chronic epigastric pain with some exacerbation over the past several months.  Patient relates this is not a new pain and that was present prior to diagnosis of the splenic artery pseudoaneurysm etc. Associated with occasional nausea, no vomiting, pain described as mild and dull.  Rule out gastritis, peptic ulcer disease, functional dyspepsia. Reviewing prior imaging she is status post cholecystectomy.  #2 history of splenic artery pseudoaneurysm with complicated course after diagnosis October 2020.  Initial attempt at percutaneous management resulted in complication with intramural hematoma and type B thoracic aortic dissection, procedure not completed.  Patient had prolonged hospitalization. She underwent embolization at [facility] in May 2021. Plan is for follow-up imaging through [place] in December 2021.  #3 atrial fibrillation 4.  Chronic anticoagulation-on Eliquis and Plavix 5.  History of iron deficiency anemia 6.  History of GERD  Plan; CBC with differential, c-Met, sed rate, iron studies Start omeprazole 40 mg p.o. every morning Patient has imaging with CT angio of the chest and abdomen scheduled within the next few weeks at  [facility].  We will follow up on those. We will plan follow-up office visit with Dr. Hilarie Fredrickson in January 2022 .  Depending on her response to omeprazole and results of the above imaging can determine if any further GI intervention or endoscopic evaluation indicated.  Carmeron Heady Genia Harold PA-C 01/07/2020   Cc: Pomposini, Cherly Anderson, MD

## 2020-01-07 NOTE — Patient Instructions (Addendum)
If you are age 82 or older, your body mass index should be between 23-30. Your Body mass index is 21.28 kg/m. If this is out of the aforementioned range listed, please consider follow up with your Primary Care Provider.  If you are age 46 or younger, your body mass index should be between 19-25. Your Body mass index is 21.28 kg/m. If this is out of the aformentioned range listed, please consider follow up with your Primary Care Provider.   Your provider has requested that you go to the basement level for lab work before leaving today. Press "B" on the elevator. The lab is located at the first door on the left as you exit the elevator.  Call the office in 2-3 weeks if medication is not helping.  You have been scheduled to follow up with Dr. Hilarie Fredrickson on March 17, 2020 at 8:30 am

## 2020-01-08 ENCOUNTER — Telehealth: Payer: Self-pay

## 2020-01-08 NOTE — Telephone Encounter (Signed)
-----   Message from Alfredia Ferguson, PA-C sent at 01/08/2020  1:22 PM EST ----- Please let pt know HGB is normal at 12.2, she is iron deficient - would like her t start ferrous sulfate 325 mg po BID with food,  Chemistries normal

## 2020-01-08 NOTE — Telephone Encounter (Signed)
Left message to please call back. °

## 2020-01-09 NOTE — Telephone Encounter (Signed)
Pt is returning a missed call to the nurse

## 2020-01-10 NOTE — Telephone Encounter (Signed)
See lab result note.

## 2020-01-11 NOTE — Progress Notes (Signed)
Addendum: Reviewed and agree with assessment and management plan. Deepika Decatur M, MD  

## 2020-01-31 ENCOUNTER — Telehealth: Payer: Self-pay | Admitting: Physician Assistant

## 2020-01-31 MED ORDER — OMEPRAZOLE 40 MG PO CPDR: 40.0000 mg | DELAYED_RELEASE_CAPSULE | Freq: Every day | ORAL | 4 refills | Status: DC

## 2020-01-31 NOTE — Telephone Encounter (Signed)
Patient's Omeprazole has been sent to her mail order pharmacy.

## 2020-01-31 NOTE — Telephone Encounter (Signed)
Inbound call from patient stating she needs the omeprazole script sent to [facility].  Stated if she receives for than one refill for a medication, she needs to receive it through [facility] and not [facility].

## 2020-03-17 ENCOUNTER — Ambulatory Visit: Payer: Medicare Other | Admitting: Internal Medicine

## 2020-03-17 IMAGING — XA IR US GUIDE VASC ACCESS RIGHT
8 of 10 series · 11 of 24 positions shown · IV contrast (IODINE)
Comparison: none

INDICATION: Enlarging splenic artery pseudoaneurysm. Patient presents today for
mesenteric arteriogram and attempted percutaneous embolization.
Please refer to formal consultation in the [REDACTED] EMR dated 12/14/2018
for additional details.

[Series 3: body 4 care · 2 of 12 slices shown (1 of 7)]
[im 1/12]
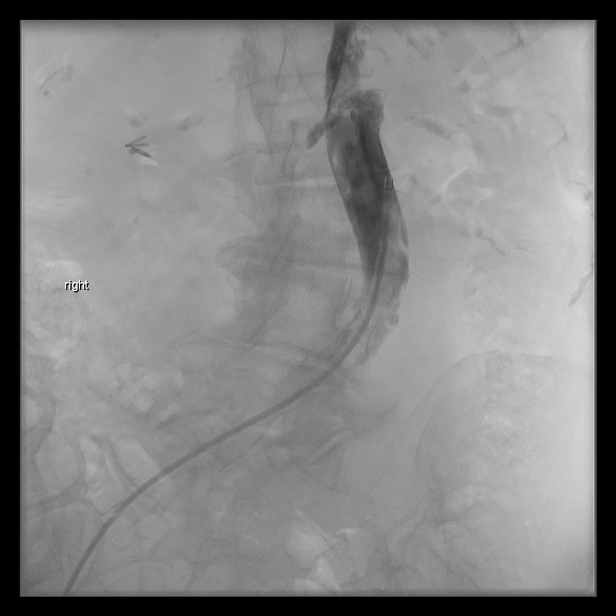
[im 12/12]
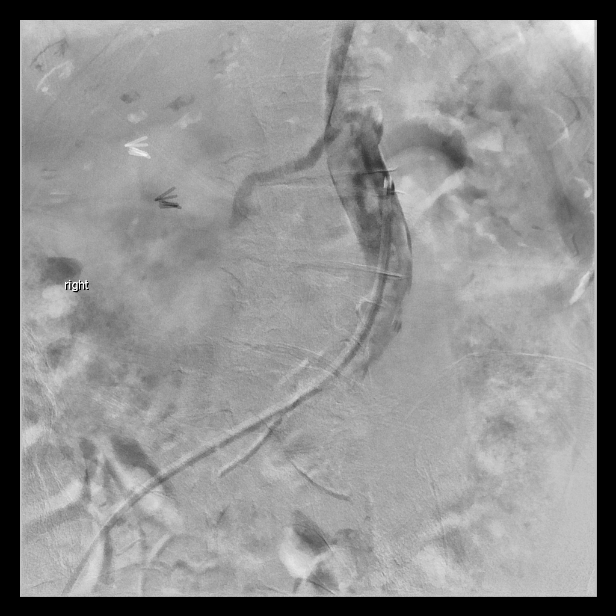

[Series 5: single · 1 of 1 slices shown]
[im 1/1]
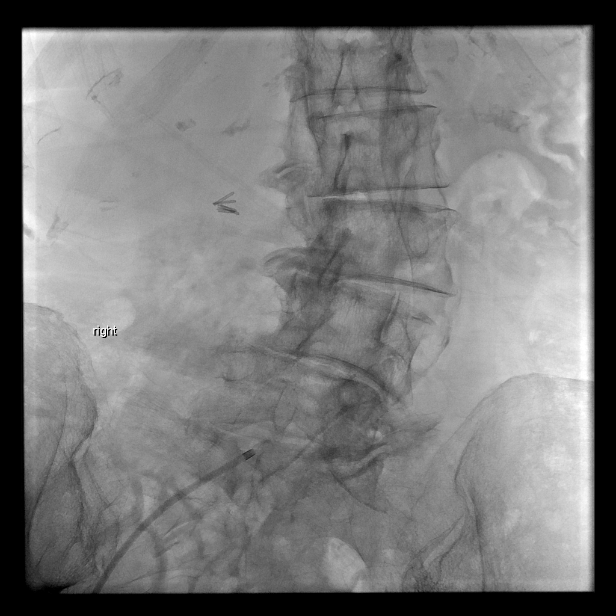

[Series 6: body 4 care · 1 of 9 slices shown (2 of 7)]
[im 5/9]
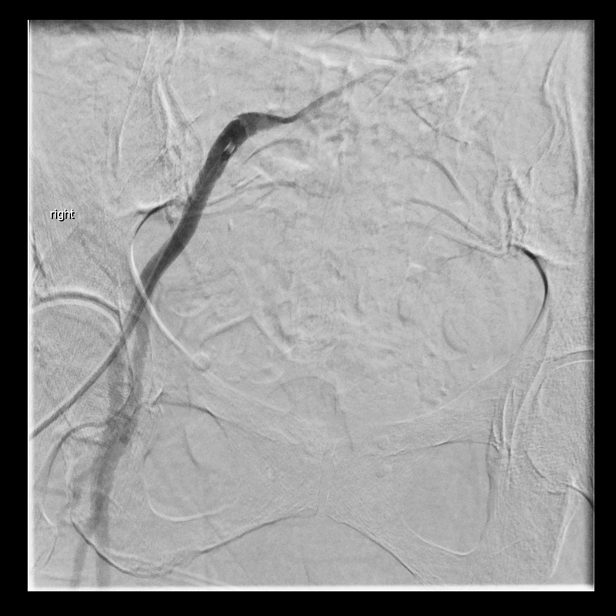

[Series 7: body 4 care · 1 of 8 slices shown (3 of 7)]
[im 1/8]
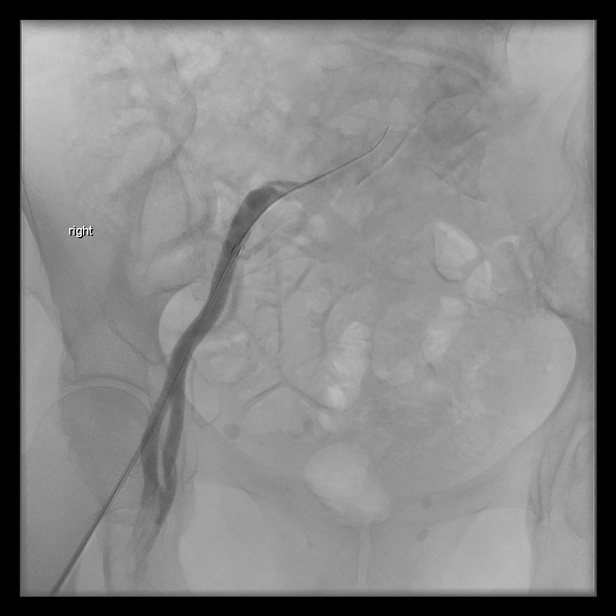

[Series 8: body 4 care · 2 of 13 slices shown (4 of 7)]
[im 5/13]
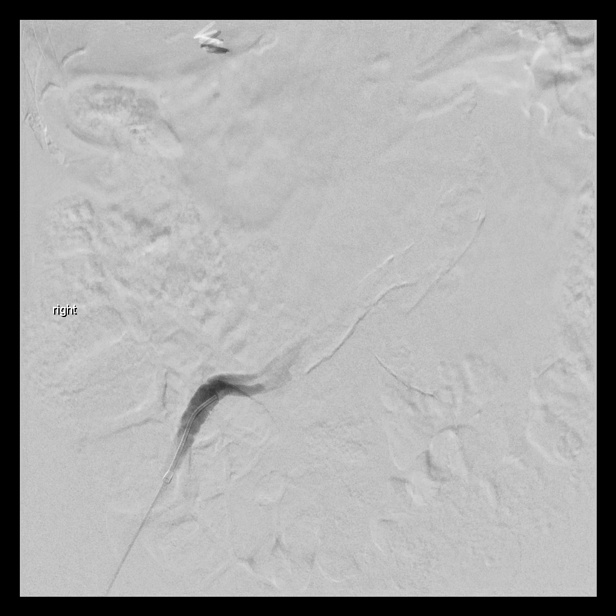
[im 13/13]
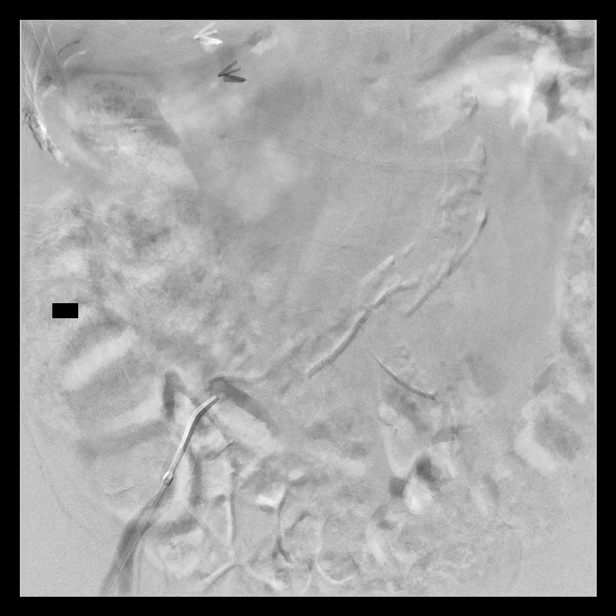

[Series 9: body 4 care · 2 of 12 slices shown (5 of 7)]
[im 4/12]
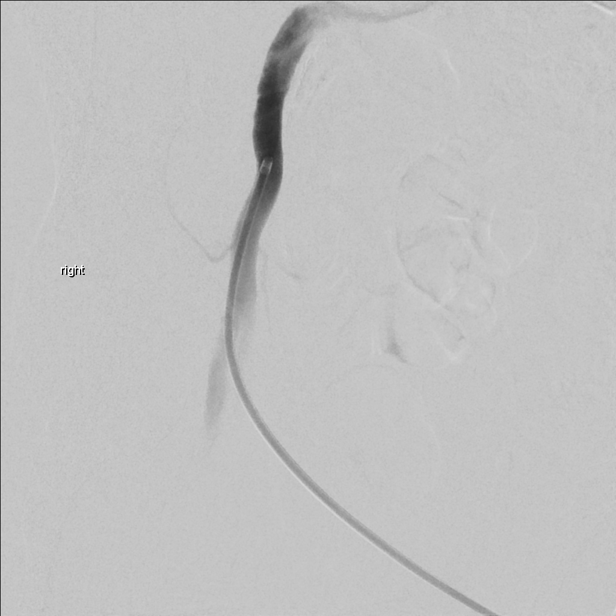
[im 12/12]
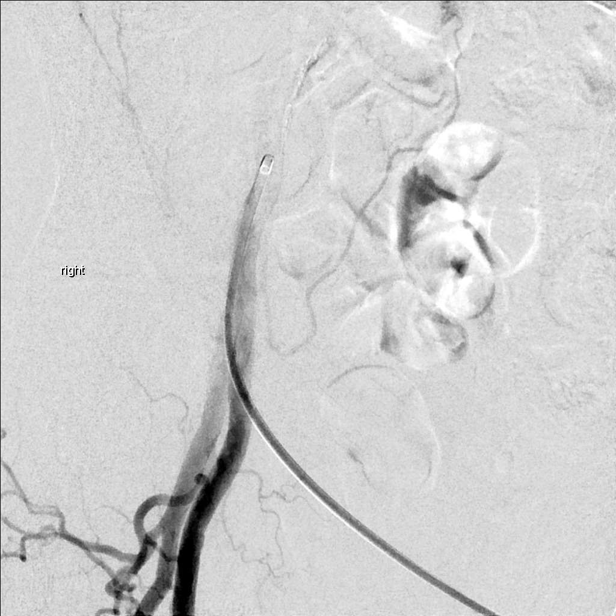

[Series 10: body 4 care · 1 of 8 slices shown (6 of 7)]
[im 8/8]
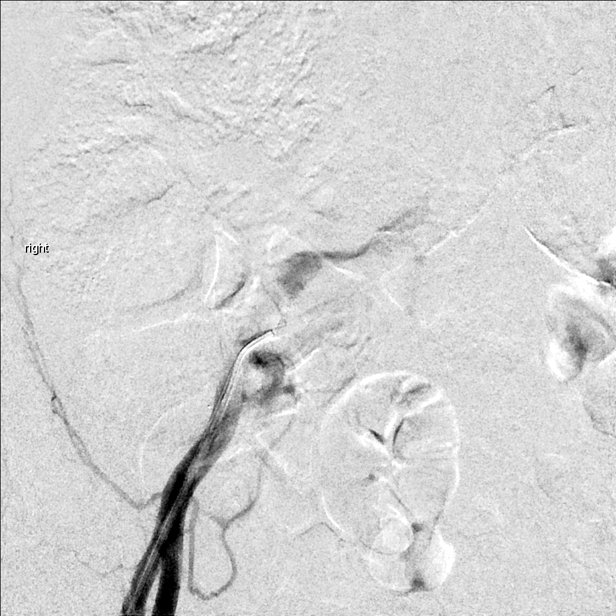

[Series 11: body 4 care · 1 of 9 slices shown (7 of 7)]
[im 5/9]
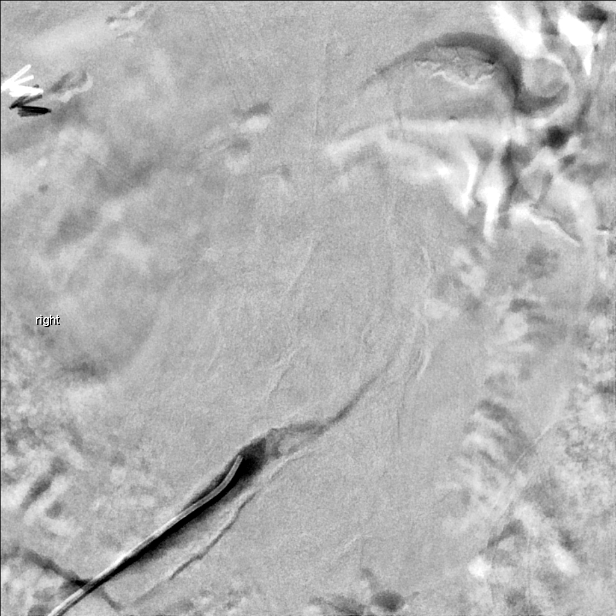

[11 of 24 positions shown; findings below may reference images not displayed]

EXAM:
Uterine Fibroid Embolization

MEDICATIONS:
None

ANESTHESIA/SEDATION:
Moderate (conscious) sedation was employed during this procedure
utilizing intravenous versed and Fentanyl.
Moderate Sedation Time: 72 minutes. The patient's level of
consciousness and vital signs were monitored continuously by
radiology nursing throughout the procedure under my direct
supervision.

CONTRAST:  60mL OMNIPAQUE IOHEXOL 300 MG/ML  SOLN

FLUOROSCOPY TIME:  6 minutes, 48 seconds (17.8 mGy)

COMPLICATIONS:
SIR LEVEL C - Requires therapy, minor hospitalization (<48 hrs).

Access site complication resulting in a type B thoracic aortic
dissection/intramural hematoma.



The right femoral head was marked fluoroscopically. Under sterile
conditions and local anesthesia, the right common femoral artery
access was performed with a micropuncture needle. Under direct
ultrasound guidance, the right common femoral was accessed with a
micropuncture kit. An ultrasound image was saved for documentation
purposes.

Note was made of difficulty advancing the Bentson wire through the
pelvic arterial system, presumably secondary to vessel tortuosity
and as such, a stiff glidewire was advanced to the level of the
abdominal aorta.

Given marked tortuosity of the pelvic vasculature and abdominal
aortic decision was made to proceed with placement of a 6 French, 35
cm radiopaque tip vascular sheath in hopes of maintaining a more
durable accessed regional to the celiac artery origin.

As such, the 6 French radiopaque sheath was advanced over the stiff
glidewire under fluoroscopic guidance without difficulty. Blood flow
was noted to easily aspirate from the side arm sheath. As such,
again over a stiff Glidewire a Mickelson catheter was advanced to
the level of the thoracic aorta where it was formed however note was
made of difficulty aspirating from the catheter. Limited contrast
injection demonstrated a subintimal positioning of the Mickelson
catheter. At this point, limited contrast injection was performed
via the side arm of the vascular sheath also demonstrating
subintimal positioning.

Given concern for creation of a dissection, the on-call vascular
surgeon ([REDACTED]) was made aware.

The 35 cm vascular sheath was retracted to the level of the right
external iliac artery with contrast injection demonstrating
appropriate intraluminal positioning. Attempts were made with a
Kumpe catheter to cannulate the true lumen of the right common iliac
artery however as a true lumen was not definitively identified, the
decision was made to abort the procedure.

The 35 cm, 6 French vascular sheath was then exchanged for a
standard 7 cm 6 French vascular sheath in case additional emergent
percutaneous intervention was required. The vascular sheath was
secured at the entrance site within interrupted suture and connected
to a pressure bag. A dressing was applied.

Note, the patient complained of expected chest and back pain with
associated hypertension and tachycardia though the symptoms largely
defervesced with administration of conscious sedation and remained
otherwise hemodynamically stable.

The patient was then escorted to CT to undergo a multiphase CT the
chest, abdomen and pelvis.
FINDINGS: Given difficulty advancing the initial Moonligt access wire to the
through the level of the pelvic arterial system initially thought to
be secondary to vessel tortuosity, a stiff glidewire was advanced
through the pelvic arterial system to the level of the abdominal
aorta. Unfortunately, the cephalad wire was in a subintimal location
and at the placement of the longer vascular sheath (selected in
hopes of obtaining a more durable access
IMPRESSION: Unsuccessful mesenteric arteriogram secondary to arterial access
complication and development of an aortic dissection/hematoma likely
originating at the confluence of the right common and external iliac
arteries due to unstable plaque.

PLAN:
- Subsequent multiphase CT scan of the chest, abdomen and pelvis
performed immediately following the above procedure confirmed
concern for a type B thoracic aortic dissection/intramural hematoma
which results in approximately 50% luminal narrowing of the right
artery though the remaining branch vessels of the abdominal aorta
remain patent without a hemodynamically significant narrowing. There
is minimal opacification of the false lumen of the dissection at the
level of the distal aspect of thoracic aorta, though otherwise there
is no definitive opacification of the false lumen and there is no
evidence of contrast extravasation.

- The patient will be admitted for blood pressure, pain control and
definitive vascular surgery consultation

- Once deemed appropriate by the admitting service, the patient will
be transitioned back to her outpatient anticoagulation as well as
will likely be initiated on platelet inhibition therapy, likely
Plavix (patient has history of aspirin allergy).

- Ultimately, I will likely repeat a multiphase CT of the chest,
abdomen and pelvis in approximately 4-6 weeks to not only evaluate
the dissection but also for procedural planning purposes for still
untreated splenic artery pseudoaneurysm.

## 2020-04-06 NOTE — Progress Notes (Unsigned)
[facility]    Provider:  Dr Jaynee Eagles Requesting Provider: Reynaldo Minium, MD Primary Care Provider:  Pomposini, Cherly Anderson, MD  CC:  Balance, patient is concerned for parkinson's disease  HPI:  Stayce LARYAH NEUSER is a 85 y.o. female here as requested by Reynaldo Minium, MD for balance disorder. 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 fib, rectal bleeding, pancreatic lesion, hypothyroidism, thrombocytosis, chronic anticoagulation, anemia, hypertension, lumbar back pain, arthritis, osteopenia.  I reviewed requesting provider's notes, patient having problems with increasing balance issues, multiple falls recently fell and strained her right knee and right ankle, was concerned about her balance disorder and having this investigated further, she has soreness to her right ankle and knee since the fall, prescription for PT was given to work on balance training and gait training, and sent for evaluation to neurology.  She says she has a list of things to speak to me about. She says she has many issues. She has had poor balance for years. She has been to PT 3-4 different times. She is cold all the time. The inner part of her lower legs get cold. Once in a while she sees tremors and feels her muscles are weak. She is dropping things with her left hand. She used to go to the gym, she feels she has aged 100 years. She has no energy. She is worried she has parkinson's disease. Her legs get cold. She has "gone to more doctors about my dizziness and no one seems to care". Equilibrium is off. She has had falls, when she goes to take her shower she holds on and she is terrified she is going to fall. She has some tingling in one spot in her leg but she can't really tell me where. She has been to multiple doctors, been to [employer]. Slowly progressive.   Reviewed notes, labs and imaging from outside physicians, which showed:  CT HEAD FINDINGS personally reviewed  imaging and agree  Brain:  Cerebral volume is normal.  Mild ill-defined hypoattenuation within the cerebral white matter is nonspecific, but compatible with chronic small vessel ischemic disease.  There is no acute intracranial hemorrhage.  No demarcated cortical infarct.  No extra-axial fluid collection.  No evidence of intracranial mass.  No midline shift.  Vascular: No hyperdense vessel.  Atherosclerotic calcifications.  Skull: Normal. Negative for fracture or focal lesion.  Sinuses/Orbits: Visualized orbits show no acute finding. Small right maxillary sinus mucous retention cyst.  CT CERVICAL SPINE FINDINGS  Alignment: Straightening of the expected cervical lordosis. Trace C5-C6 grade 1 retrolisthesis. 2 mm C7-T1 grade 1 anterolisthesis, unchanged.  Skull base and vertebrae: The basion-dental and atlanto-dental intervals are maintained.No evidence of acute fracture to the cervical spine.  Soft tissues and spinal canal: No prevertebral fluid or swelling. No visible canal hematoma.  Disc levels: Cervical spondylosis. Most notably at C5-C6 and C6-C7, there is advanced disc space narrowing with posterior disc osteophytes and uncovertebral hypertrophy. Bilateral neural foraminal narrowing and at least mild spinal canal stenosis at these levels.  Upper chest: No consolidation within the imaged lung apices. No visible pneumothorax.  Other: 7 mm rounded focus of calcification within the posterolateral spinal canal at the T2-T3 level (series 7, image 84). Resultant at least mild spinal canal stenosis at this level.  IMPRESSION: Personally reviewed imaging and agree with the following: CT head:  1. No evidence of acute intracranial abnormality. 2. Mild chronic small vessel ischemic disease. 3. Small right maxillary sinus mucous  retention cyst.  CT cervical spine:  1. No evidence of acute fracture to the cervical spine. 2. Trace C5-C6 grade  1 retrolisthesis. 2 mm C7-T1 grade 1 retrolisthesis. Findings are unchanged and likely degenerative. 3. Cervical spondylosis as described and greatest at C5-C6 and C6-C7. 4. 7 mm rounded focus of calcification within the posterolateral spinal canal at the T2-T3 level. This may reflect an incidental small meningioma or ligamentum flavum calcification. Resultant at least mild spinal canal stenosis at this level.  MRI brain 2016 report only [facility]  FINDINGS: Mild-moderate patchy T2/FLAIR hyperintensities are present within  the periventricular and subcortical white matter, consistent with chronic  small vessel ischemic changes and within normal limits for patient's age.  There is no disproportionate lobar atrophy. Though the sensitivity for  detection of Alzheimers disease on conventional MRI is limited, there is no  specific evidence of disproportionate atrophy of the hippocampi, medial  temporal lobes, or Sylvian cisterns to specifically suggest this. No  disproportionate ventricular enlargement. The extra-axial spaces and basal  cisterns are [normal]. There is no acute cortical infarct, intracranial  hemorrhage, mass, or mass-effect. Intracranial flow-voids appear normal.  The orbits, cranium, and mastoid sinusesare unremarkable. Prior bilateral  cataract surgery.Small maxillary sinus mucus retention cysts.   IMPRESSION:  No specific etiology for memory loss is identified   MRI+I lumbar spine [facility] 2013  FINDINGS: Mild-moderate patchy T2/FLAIR hyperintensities are present within  the periventricular and subcortical white matter, consistent with chronic  small vessel ischemic changes and within normal limits for patient's age.  There is no disproportionate lobar atrophy. Though the sensitivity for  detection of Alzheimers disease on conventional MRI is limited, there is no  specific evidence of disproportionate atrophy of the hippocampi, medial  temporal lobes, or Sylvian cisterns  to specifically suggest this. No  disproportionate ventricular enlargement. The extra-axial spaces and basal  cisterns are [normal]. There is no acute cortical infarct, intracranial  hemorrhage, mass, or mass-effect. Intracranial flow-voids appear normal.  The orbits, cranium, and mastoid sinusesare unremarkable. Prior bilateral  cataract surgery.Small maxillary sinus mucus retention cysts.   IMPRESSION:  No specific etiology for memory loss is identified   Review of Systems: Patient complains of symptoms per HPI as well as the following symptoms: imbalance. Pertinent negatives and positives per HPI. All others negative.   Social History   Socioeconomic History  . Marital status: Single    Spouse name: Not on file  . Number of children: 0  . Years of education: Not on file  . Highest education level: Master's degree (e.g., MA, MS, MEng, MEd, MSW, MBA)  Occupational History  . Occupation: RETIRED  Tobacco Use  . Smoking status: Never Smoker  . Smokeless tobacco: Never Used  Vaping Use  . Vaping Use: Never used  Substance and Sexual Activity  . Alcohol use: Never    Alcohol/week: 0.0 standard drinks    Comment: rare  . Drug use: Never  . Sexual activity: Not on file  Other Topics Concern  . Not on file  Social History Narrative   Lives at home alone   Caffeine: occasionally   Right handed   Social Determinants of Health   Financial Resource Strain: Not on file  Food Insecurity: Not on file  Transportation Needs: Not on file  Physical Activity: Not on file  Stress: Not on file  Social Connections: Not on file  Intimate Partner Violence: Not on file    Family History  Problem Relation Age of Onset  .  Cancer Father        blood cancer, ? type  . Rheum arthritis Father   . Glaucoma Father   . Rheum arthritis Mother   . Arthritis Sister   . Diabetes Sister        TYPE 2  . Heart failure Sister   . Hyperlipidemia Sister   . High blood pressure Other   .  Colon cancer Neg Hx   . Esophageal cancer Neg Hx   . Pancreatic cancer Neg Hx   . Stomach cancer Neg Hx   . Liver disease Neg Hx   . Parkinson's disease Neg Hx     Past Medical History:  Diagnosis Date  . Anemia   . Anxiety   . Arthritis   . Atrial fibrillation ([place])   . Barrett's esophagus   . Blood transfusion without reported diagnosis   . Chronic paronychia of finger, left   . Complication of anesthesia   . Dysrhythmia   . Fatigue   . Finger lesion   . Ganglion   . GERD (gastroesophageal reflux disease)   . Glaucoma   . Heart disease   . Hiatal hernia   . HLD (hyperlipidemia)   . HTN (hypertension)   . Hypothyroidism   . Internal hemorrhoids   . Iron deficiency anemia   . Osteopenia   . Osteoporosis   . Pancreatic cyst   . Peptic ulcer disease with hemorrhage   . Plantar fasciitis   . PONV (postoperative nausea and vomiting)    Confusion, Combativeness  . Rheumatism   . Shortness of breath   . UTI (urinary tract infection)     Patient Active Problem List   Diagnosis Date Noted  . Dissecting aneurysm of thoracic aorta, [place] type B ([place]) 12/28/2018  . Dissection of ascending aorta ([place]) 12/27/2018  . Aortic dissection ([place]) 12/27/2018  . Abdominal aortic aneurysm ([place]) 12/27/2018  . Hypothyroidism 12/27/2018  . Aneurysm, 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[place]) 12/27/2018  . Paroxysmal atrial fibrillation ([place]) 12/27/2018  . Chronic anticoagulation 12/27/2018  . Thrombocytosis 12/27/2018  . Lesion of pancreas   . Rectal bleeding 09/23/2016  . Internal hemorrhoid 09/23/2016  . Anemia, iron deficiency 05/14/2014    Past Surgical History:  Procedure Laterality Date  . ABDOMINAL HYSTERECTOMY  1984  . APPENDECTOMY  2007  . CATARACT EXTRACTION Bilateral   . CHOLECYSTECTOMY  2007  . COLONOSCOPY    . ESOPHAGOGASTRODUODENOSCOPY (EGD) WITH PROPOFOL N/A 11/30/2018   Procedure: ESOPHAGOGASTRODUODENOSCOPY (EGD) WITH PROPOFOL;  Surgeon: Milus Banister, MD;  Location: [facility]  ENDOSCOPY;  Service: Endoscopy;  Laterality: N/A;  . EUS N/A 11/30/2018   Procedure: UPPER ENDOSCOPIC ULTRASOUND (EUS) RADIAL;  Surgeon: Milus Banister, MD;  Location: [facility] ENDOSCOPY;  Service: Endoscopy;  Laterality: N/A;  . IR EMBO ARTERIAL NOT HEMORR HEMANG INC GUIDE ROADMAPPING  12/27/2018  . IR RADIOLOGIST EVAL & MGMT  12/14/2018  . IR RADIOLOGIST EVAL & MGMT  04/05/2019  . IR RADIOLOGIST EVAL & MGMT  04/26/2019  . IR RADIOLOGIST EVAL & MGMT  05/15/2019  . IR US GUIDE VASC ACCESS RIGHT  12/27/2018  . spleen aneurysm     IR procedure to repair; had at [facility] in 2021  . UPPER GASTROINTESTINAL ENDOSCOPY      Current Outpatient Medications  Medication Sig Dispense Refill  . amiodarone (PACERONE) 200 MG tablet Take 200 mg by mouth daily.    Marland Kitchen apixaban (ELIQUIS) 2.5 MG TABS tablet Take 2.5 mg by mouth 2 (two) times daily.     Marland Kitchen  Calcium Carb-Cholecalciferol (CALCIUM-VITAMIN D) 600-400 MG-UNIT TABS Take 1 tablet by mouth daily.    . colestipol (COLESTID) 1 G tablet Take 2 g by mouth 2 (two) times daily.     Marland Kitchen ezetimibe (ZETIA) 10 MG tablet Take 10 mg by mouth daily.    Marland Kitchen latanoprost (XALATAN) 0.005 % ophthalmic solution Place 1 drop into both eyes at bedtime.    Marland Kitchen levothyroxine (SYNTHROID, LEVOTHROID) 75 MCG tablet Take 75 mcg by mouth daily before breakfast.    . metoprolol tartrate (LOPRESSOR) 50 MG tablet Take 50 mg by mouth 2 (two) times daily.    . mirtazapine (REMERON) 15 MG tablet Take 15 mg by mouth at bedtime.    Marland Kitchen omeprazole (PRILOSEC) 40 MG capsule Take 1 capsule (40 mg total) by mouth daily. 90 capsule 4  . propafenone (RYTHMOL) 225 MG tablet Take 1 tablet (225 mg total) by mouth every 8 (eight) hours. 90 tablet 0  . raloxifene (EVISTA) 60 MG tablet Take 60 mg by mouth at bedtime.     . timolol (BETIMOL) 0.5 % ophthalmic solution Place 1 drop into both eyes daily.     No current facility-administered medications for this visit.    Allergies as of 04/07/2020 - Review Complete  04/07/2020  Allergen Reaction Noted  . Ace inhibitors Cough   . Amoxicillin Other (See Comments) 02/18/2017  . Ansaid [flurbiprofen]  04/07/2020  . Aspirin    . Atorvastatin Other (See Comments) 02/18/2017  . Crestor  [rosuvastatin calcium]  10/17/2017  . Iodine Nausea Only 08/09/2016  . Lipitor  [atorvastatin calcium]  10/17/2017  . Motrin [ibuprofen]  04/07/2020  . Oxycodone Nausea And Vomiting 10/10/2015  . Oxycodone-acetaminophen Nausea And Vomiting 07/09/2014  . Paroxetine hcl Nausea Only 11/03/2015  . Sulfa antibiotics Nausea And Vomiting 05/14/2014  . Sulfasalazine Nausea And Vomiting 05/14/2014  . Codeine Nausea And Vomiting 05/14/2014    Vitals: BP (!) 177/86 (BP Location: Left Arm, Patient Position: Sitting)   Pulse (!) 52   Ht '5\' 4"'  (1.626 m)   Wt 119 lb (54 kg)   BMI 20.43 kg/m  Last Weight:  Wt Readings from Last 1 Encounters:  04/07/20 119 lb (54 kg)   Last Height:   Ht Readings from Last 1 Encounters:  04/07/20 '5\' 4"'  (1.626 m)     Physical exam: Exam: Gen: NAD, anxious, conversant, well nourised, thin , well groomed                   CV: RRR, no MRG. No Carotid Bruits. No peripheral edema, warm, nontender Eyes: Conjunctivae clear without exudates or hemorrhage  Neuro: Detailed Neurologic Exam  Speech:    Speech is normal; fluent and spontaneous with normal comprehension.  Cognition:    The patient is oriented to person, place, and time;     recent and remote memory intact;     language fluent;     normal attention, concentration,     fund of knowledge Cranial Nerves:    The pupils are equal, round, and reactive to light. Pupils too small to visualize fundi. . Visual fields are full to finger confrontation. Extraocular movements are intact. Trigeminal sensation is intact and the muscles of mastication are normal. The face is symmetric. The palate elevates in the midline. Hearing intact. Voice is normal. Shoulder shrug is normal. The tongue has  normal motion without fasciculations.   Coordination:    Normal finger to nose    Gait:    Good arm swing  and stride. Slight imbalance with tandem. Can heel and toe walk..   Motor Observation:     no involuntary movements noted. Tone:    Normal muscle tone.    Posture:    Posture is normal. normal erect    Strength: hip flexion 4/5 bilat.     Strength is V/V in the upper and lower limbs.      Sensation: intact to LT     Reflex Exam:  DTR's:    Deep tendon reflexes in the upper and lower extremities are normal bilaterally.   Toes:    The toes are downgoing bilaterally.   Clonus:    Clonus is absent.    Assessment/Plan:  Really lovely  85 y.o. female here as requested by Reynaldo Minium, MD for balance disorder. 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 fib, rectal bleeding, pancreatic lesion, hypothyroidism, thrombocytosis, chronic anticoagulation, anemia, hypertension, lumbar back pain, arthritis, osteopenia  - patient has seen multiple physicians and neurologists in the past for chronic lightheadedness and dizziness. She also appears anxious which may be contributing to her symptoms. She says she has a long list of issues including: Chronic dizziness and lightheadedness for many years, poor balance for many years, left-sided weakness, dropping things with her left hand, no energy, feeling cold, equilibrium impairment, tingling in her limbs, she states she has been to multiple doctors even up at [facility], no one's been able to give her any answer.   MRI cervical spine and lumbar spine: for imbalance, falls and weakness to see if there is any cervical myelopathy or lumbar stenosis. (Please image down to t2-t3 on the cervical spine image to examine the spinal meningioma seen on CT cervical spine)  EMG/NCS: left arm and leg for left-sided weakness.   Blood work  Lightheadedness/dizziness: She has had these multiple symptoms for years and she  has seen multiple doctors, here at [facility] and [facility] as well as prior neurologists. Chronic.  Orders Placed This Encounter  Procedures  . MR CERVICAL SPINE WO CONTRAST  . MR LUMBAR SPINE WO CONTRAST  . B12 and Folate Panel  . Vitamin B1  . Vitamin B6  . Heavy metals, blood  . Multiple Myeloma Panel (SPEP&IFE w/QIG)  . CK  . Methylmalonic acid, serum  . NCV with EMG(electromyography)   No orders of the defined types were placed in this encounter.   Cc: Reynaldo Minium, MD,  Pomposini, Cherly Anderson, MD  Sarina Ill, MD  [facility] [address] [place], [place] [address]  Phone [phone] Fax [phone]

## 2020-04-07 ENCOUNTER — Encounter: Payer: Self-pay | Admitting: Neurology

## 2020-04-07 ENCOUNTER — Ambulatory Visit (INDEPENDENT_AMBULATORY_CARE_PROVIDER_SITE_OTHER): Payer: Medicare Other | Admitting: Neurology

## 2020-04-07 ENCOUNTER — Encounter: Payer: Self-pay | Admitting: *Deleted

## 2020-04-07 ENCOUNTER — Other Ambulatory Visit: Payer: Self-pay

## 2020-04-07 VITALS — BP 177/86 | HR 52 | Ht 64.0 in | Wt 119.0 lb

## 2020-04-07 DIAGNOSIS — R269 Unspecified abnormalities of gait and mobility: Secondary | ICD-10-CM | POA: Diagnosis not present

## 2020-04-07 DIAGNOSIS — R202 Paresthesia of skin: Secondary | ICD-10-CM

## 2020-04-07 DIAGNOSIS — R270 Ataxia, unspecified: Secondary | ICD-10-CM | POA: Diagnosis not present

## 2020-04-07 DIAGNOSIS — W19XXXA Unspecified fall, initial encounter: Secondary | ICD-10-CM

## 2020-04-07 DIAGNOSIS — R531 Weakness: Secondary | ICD-10-CM

## 2020-04-07 DIAGNOSIS — R200 Anesthesia of skin: Secondary | ICD-10-CM | POA: Diagnosis not present

## 2020-04-07 NOTE — Patient Instructions (Addendum)
MRI lumbar spine MRI cervical spine EMG/NCS - left arm and left leg Blood work  Dr. Felipa Eth and Dr. Sandi Mariscal    Electromyoneurogram Electromyoneurogram is a test to check how well your muscles and nerves are working. This procedure includes the combined use of electromyogram (EMG) and nerve conduction study (NCS). EMG is used to look for muscular disorders. NCS, which is also called electroneurogram, measures how well your nerves are controlling your muscles. The procedures are usually done together to check if your muscles and nerves are healthy. If the results of the tests are abnormal, this may indicate disease or injury, such as a neuromuscular disease or peripheral nerve damage. Tell a health care provider about:  Any allergies you have.  All medicines you are taking, including vitamins, herbs, eye drops, creams, and over-the-counter medicines.  Any problems you or family members have had with anesthetic medicines.  Any blood disorders you have.  Any surgeries you have had.  Any medical conditions you have.  If you have a pacemaker.  Whether you are pregnant or may be pregnant. What are the risks? Generally, this is a safe procedure. However, problems may occur, including:  Infection where the electrodes were inserted.  Bleeding. What happens before the procedure? Medicines Ask your health care provider about:  Changing or stopping your regular medicines. This is especially important if you are taking diabetes medicines or blood thinners.  Taking medicines such as aspirin and ibuprofen. These medicines can thin your blood. Do not take these medicines unless your health care provider tells you to take them.  Taking over-the-counter medicines, vitamins, herbs, and supplements. General instructions  Your health care provider may ask you to avoid: ? Beverages that have caffeine, such as coffee and tea. ? Any products that contain nicotine or tobacco. These products  include cigarettes, e-cigarettes, and chewing tobacco. If you need help quitting, ask your health care provider.  Do not use lotions or creams on the same day that you will be having the procedure. What happens during the procedure? For EMG  Your health care provider will ask you to stay in a position so that he or she can access the muscle that will be studied. You may be standing, sitting, or lying down.  You may be given a medicine that numbs the area (local anesthetic).  A very thin needle that has an electrode will be inserted into your muscle.  Another small electrode will be placed on your skin near the muscle.  Your health care provider will ask you to continue to remain still.  The electrodes will send a signal that tells about the electrical activity of your muscles. You may see this on a monitor or hear it in the room.  After your muscles have been studied at rest, your health care provider will ask you to contract or flex your muscles. The electrodes will send a signal that tells about the electrical activity of your muscles.  Your health care provider will remove the electrodes and the electrode needles when the procedure is finished. The procedure may vary among health care providers and hospitals.   For NCS  An electrode that records your nerve activity (recording electrode) will be placed on your skin by the muscle that is being studied.  An electrode that is used as a reference (reference electrode) will be placed near the recording electrode.  A paste or gel will be applied to your skin between the recording electrode and the reference electrode.  Your  nerve will be stimulated with a mild shock. Your health care provider will measure how much time it takes for your muscle to react.  Your health care provider will remove the electrodes and the gel when the procedure is finished. The procedure may vary among health care providers and hospitals.   What happens after  the procedure?  It is up to you to get the results of your procedure. Ask your health care provider, or the department that is doing the procedure, when your results will be ready.  Your health care provider may: ? Give you medicines for any pain. ? Monitor the insertion sites to make sure that bleeding stops. Summary  Electromyoneurogram is a test to check how well your muscles and nerves are working.  If the results of the tests are abnormal, this may indicate disease or injury.  This is a safe procedure. However, problems may occur, such as bleeding and infection.  Your health care provider will do two tests to complete this procedure. One checks your muscles (EMG) and another checks your nerves (NCS).  It is up to you to get the results of your procedure. Ask your health care provider, or the department that is doing the procedure, when your results will be ready. This information is not intended to replace advice given to you by your health care provider. Make sure you discuss any questions you have with your health care provider. Document Revised: 10/25/2017 Document Reviewed: 10/07/2017 Elsevier Patient Education  [place].

## 2020-04-08 ENCOUNTER — Telehealth: Payer: Self-pay | Admitting: Neurology

## 2020-04-08 NOTE — Telephone Encounter (Signed)
Medicare/[employer] Josem Kaufmann: NPR spoke to Lucrezia Starch Ref # [id number] order sent to GI. They will reach out to the patient to schedule.

## 2020-04-18 LAB — MULTIPLE MYELOMA PANEL, SERUM
Albumin SerPl Elph-Mcnc: 3.8 g/dL (ref 2.9–4.4)
Albumin/Glob SerPl: 1.1 (ref 0.7–1.7)
Alpha 1: 0.3 g/dL (ref 0.0–0.4)
Alpha2 Glob SerPl Elph-Mcnc: 0.7 g/dL (ref 0.4–1.0)
B-Globulin SerPl Elph-Mcnc: 1.2 g/dL (ref 0.7–1.3)
Gamma Glob SerPl Elph-Mcnc: 1.3 g/dL (ref 0.4–1.8)
Globulin, Total: 3.5 g/dL (ref 2.2–3.9)
IgA/Immunoglobulin A, Serum: 343 mg/dL (ref 64–422)
IgG (Immunoglobin G), Serum: 1181 mg/dL (ref 586–1602)
IgM (Immunoglobulin M), Srm: 58 mg/dL (ref 26–217)
Total Protein: 7.3 g/dL (ref 6.0–8.5)

## 2020-04-18 LAB — HEAVY METALS, BLOOD
Arsenic: 2 ug/L (ref 0–9)
Lead, Blood: <1 ug/dL (ref 0–4)
Mercury: <1 ug/L (ref 0.0–14.9)

## 2020-04-18 LAB — METHYLMALONIC ACID, SERUM: Methylmalonic Acid: 115 nmol/L (ref 0–378)

## 2020-04-18 LAB — VITAMIN B6: Vitamin B6: 7.4 ug/L (ref 2.0–32.8)

## 2020-04-18 LAB — B12 AND FOLATE PANEL
Folate: 11.6 ng/mL (ref >3.0)
Vitamin B-12: 1028 pg/mL (ref 232–1245)

## 2020-04-18 LAB — CK: Total CK: 54 U/L (ref 26–161)

## 2020-04-18 LAB — VITAMIN B1: Thiamine: 144.4 nmol/L (ref 66.5–200.0)

## 2020-04-24 ENCOUNTER — Ambulatory Visit (INDEPENDENT_AMBULATORY_CARE_PROVIDER_SITE_OTHER): Payer: Medicare Other | Admitting: Neurology

## 2020-04-24 DIAGNOSIS — R531 Weakness: Secondary | ICD-10-CM | POA: Diagnosis not present

## 2020-04-24 DIAGNOSIS — Z0289 Encounter for other administrative examinations: Secondary | ICD-10-CM

## 2020-04-24 NOTE — Progress Notes (Signed)
See procedure note.

## 2020-04-24 NOTE — Progress Notes (Signed)
  Full Name: Megan Mercer Gender: Female MRN #: [id number] Date of Birth: 06/14/1935    Visit Date: 04/24/2020 10:18 Age: 84 Years Examining Physician: Garan Frappier, MD  Requesting Provider: Hermann, Mark, MD Primary Care Provider:  Pomposini, Daniel L, MD  History: Left-sided weakness Summary: EMG and NCS were performed on the left upper and lower extremities. The left Tibial motor nerve showed decreased conduction velocity(37m/s, N>41).  The left Tibial F Wave showed delayed latency(61.7ms, N<56). All remaining nerves (as indicated in the following tables) were within normal limits.All muscles (as indicated in the following tables) were within normal limits.    Conclusion: This is an essentially normal exam. No suggestion of mononeuropathy, large-fiber polyneuropathy, radiculopathy or muscle disease.   ------------------------------- Ezinne Yogi, M.D.  [facility] [address], [address] [place], [place] [address] Tel: [phone] Fax: [phone]  Verbal informed consent was obtained from the patient, patient was informed of potential risk of procedure, including bruising, bleeding, hematoma formation, infection, muscle weakness, muscle pain, numbness, among others.        MNC    Nerve / Sites Muscle Latency Ref. Amplitude Ref. Rel Amp Segments Distance Velocity Ref. Area    ms ms mV mV %  cm m/s m/s mVms  L Median - APB     Wrist APB 4.1 ?4.4 5.4 ?4.0 100 Wrist - APB 7   18.9     Upper arm APB 8.4  5.2  95.6 Upper arm - Wrist 21 49 ?49 17.9  L Ulnar - ADM     Wrist ADM 3.3 ?3.3 8.9 ?6.0 100 Wrist - ADM 7   25.3     B.Elbow ADM 6.9  7.8  87.7 B.Elbow - Wrist 18 50 ?49 23.9     A.Elbow ADM 8.9  7.4  95 A.Elbow - B.Elbow 10 49 ?49 23.0  L Peroneal - EDB     Ankle EDB 5.3 ?6.5 5.1 ?2.0 100 Ankle - EDB 9   18.4     Fib head EDB 11.0  4.2  83 Fib head - Ankle 25 44 ?44 16.8     Pop fossa EDB 13.3  4.7  109 Pop fossa - Fib head 10 44 ?44 17.7          Pop fossa - Ankle      L Tibial - AH     Ankle AH 5.7 ?5.8 4.1 ?4.0 100 Ankle - AH 9   8.3     Pop fossa AH 15.0  2.9  71.7 Pop fossa - Ankle 34 37 ?41 10.2                  SNC    Nerve / Sites Rec. Site Peak Lat Ref.  Amp Ref. Segments Distance Peak Diff Ref.    ms ms V V  cm ms ms  L Sural - Ankle (Calf)     Calf Ankle 3.9 ?4.4 7 ?6 Calf - Ankle 14    L Superficial peroneal - Ankle     Lat leg Ankle 4.1 ?4.4 7 ?6 Lat leg - Ankle 14    L Median, Ulnar - Transcarpal comparison     Median Palm Wrist 2.6 ?2.2 61 ?35 Median Palm - Wrist 8       Ulnar Palm Wrist 2.4 ?2.2 19 ?12 Ulnar Palm - Wrist 8          Median Palm - Ulnar Palm  0.2 ?0.4  L Median - Orthodromic (  Dig II, Mid palm)     Dig II Wrist 3.4 ?3.4 10 ?10 Dig II - Wrist 13    L Ulnar - Orthodromic, (Dig V, Mid palm)     Dig V Wrist 3.1 ?3.1 9 ?5 Dig V - Wrist 79                 F  Wave    Nerve F Lat Ref.   ms ms  L Tibial - AH 61.7 ?56.0  L Ulnar - ADM 31.7 ?32.0         EMG Summary Table    Spontaneous MUAP Recruitment  Muscle IA Fib PSW Fasc Other Amp Dur. Poly Pattern  L. Deltoid Normal None None None _______ Normal Normal Normal Normal  L. Triceps brachii Normal None None None _______ Normal Normal Normal Normal  L. Biceps brachii Normal None None None _______ Normal Normal Normal Normal  L. Pronator teres Normal None None None _______ Normal Normal Normal Normal  L. First dorsal interosseous Normal None None None _______ Normal Normal Normal Normal  L. Cervical paraspinals (low) Normal None None None _______ Normal Normal Normal Normal  L. Lumbar paraspinals (low) Normal None None None _______ Normal Normal Normal Normal  L. Iliopsoas Normal None None None _______ Normal Normal Normal Normal  L. Vastus medialis Normal None None None _______ Normal Normal Normal Normal  L. Tibialis anterior Normal None None None _______ Normal Normal Normal Normal  L. Gastrocnemius (Medial head) Normal None None None  _______ Normal Normal Normal Normal  L. Biceps femoris (long head) Normal None None None _______ Normal Normal Normal Normal  L. Gluteus maximus Normal None None None _______ Normal Normal Normal Normal  L. Gluteus medius Normal None None None _______ Normal Normal Normal Normal

## 2020-04-27 ENCOUNTER — Other Ambulatory Visit: Payer: Self-pay

## 2020-04-27 ENCOUNTER — Ambulatory Visit
Admission: RE | Admit: 2020-04-27 | Discharge: 2020-04-27 | Disposition: A | Discharge status: Home / Self Care | Payer: Medicare Other | Source: Ambulatory Visit | Attending: Neurology | Admitting: Neurology

## 2020-04-27 DIAGNOSIS — R531 Weakness: Secondary | ICD-10-CM

## 2020-04-27 DIAGNOSIS — R200 Anesthesia of skin: Secondary | ICD-10-CM

## 2020-04-27 DIAGNOSIS — R269 Unspecified abnormalities of gait and mobility: Secondary | ICD-10-CM | POA: Diagnosis not present

## 2020-04-27 DIAGNOSIS — W19XXXA Unspecified fall, initial encounter: Secondary | ICD-10-CM

## 2020-04-27 DIAGNOSIS — R270 Ataxia, unspecified: Secondary | ICD-10-CM

## 2020-04-27 DIAGNOSIS — R202 Paresthesia of skin: Secondary | ICD-10-CM

## 2020-04-28 NOTE — Procedures (Signed)
Full Name: Megan Mercer Gender: Female MRN #: [id number] Date of Birth: 1935-04-18    Visit Date: 04/24/2020 10:18 Age: 85 Years Examining Physician: Sarina Ill, MD  Requesting Provider: Reynaldo Minium, MD Primary Care Provider:  Clinton Quant, MD  History: Left-sided weakness Summary: EMG and NCS were performed on the left upper and lower extremities. The left Tibial motor nerve showed decreased conduction velocity(26m/s, N>41).  The left Tibial F Wave showed delayed latency(61.33ms, N<56). All remaining nerves (as indicated in the following tables) were within normal limits.All muscles (as indicated in the following tables) were within normal limits.    Conclusion: This is an essentially normal exam. No suggestion of mononeuropathy, large-fiber polyneuropathy, radiculopathy or muscle disease.   ------------------------------- Sarina Ill, M.D.  [facility] [address] [place], [place] [address] Tel: [phone] Fax: [phone]  Verbal informed consent was obtained from the patient, patient was informed of potential risk of procedure, including bruising, bleeding, hematoma formation, infection, muscle weakness, muscle pain, numbness, among others.        [place]    Nerve / Sites Muscle Latency Ref. Amplitude Ref. Rel Amp Segments Distance Velocity Ref. Area    ms ms mV mV %  cm m/s m/s mVms  L Median - APB     Wrist APB 4.1 ?4.4 5.4 ?4.0 100 Wrist - APB 7   18.9     Upper arm APB 8.4  5.2  95.6 Upper arm - Wrist 21 49 ?49 17.9  L Ulnar - ADM     Wrist ADM 3.3 ?3.3 8.9 ?6.0 100 Wrist - ADM 7   25.3     B.Elbow ADM 6.9  7.8  87.7 B.Elbow - Wrist 18 50 ?49 23.9     A.Elbow ADM 8.9  7.4  95 A.Elbow - B.Elbow 10 49 ?49 23.0  L Peroneal - EDB     Ankle EDB 5.3 ?6.5 5.1 ?2.0 100 Ankle - EDB 9   18.4     Fib head EDB 11.0  4.2  83 Fib head - Ankle 25 44 ?44 16.8     Pop fossa EDB 13.3  4.7  109 Pop fossa - Fib head 10 44 ?44 17.7          Pop fossa - Ankle      L Tibial - AH     Ankle AH 5.7 ?5.8 4.1 ?4.0 100 Ankle - AH 9   8.3     Pop fossa AH 15.0  2.9  71.7 Pop fossa - Ankle 34 37 ?41 10.2                  SNC    Nerve / Sites Rec. Site Peak Lat Ref.  Amp Ref. Segments Distance Peak Diff Ref.    ms ms V V  cm ms ms  L Sural - Ankle (Calf)     Calf Ankle 3.9 ?4.4 7 ?6 Calf - Ankle 14    L Superficial peroneal - Ankle     Lat leg Ankle 4.1 ?4.4 7 ?6 Lat leg - Ankle 14    L Median, Ulnar - Transcarpal comparison     Median Palm Wrist 2.6 ?2.2 61 ?35 Median Palm - Wrist 8       Ulnar Palm Wrist 2.4 ?2.2 19 ?12 Ulnar Palm - Wrist 8          Median Palm - Ulnar Palm  0.2 ?0.4  L Median - Orthodromic (  Dig II, Mid palm)     Dig II Wrist 3.4 ?3.4 10 ?10 Dig II - Wrist 13    L Ulnar - Orthodromic, (Dig V, Mid palm)     Dig V Wrist 3.1 ?3.1 9 ?5 Dig V - Wrist 14                 F  Wave    Nerve F Lat Ref.   ms ms  L Tibial - AH 61.7 ?56.0  L Ulnar - ADM 31.7 ?32.0         EMG Summary Table    Spontaneous MUAP Recruitment  Muscle IA Fib PSW Fasc Other Amp Dur. Poly Pattern  L. Deltoid Normal None None None _______ Normal Normal Normal Normal  L. Triceps brachii Normal None None None _______ Normal Normal Normal Normal  L. Biceps brachii Normal None None None _______ Normal Normal Normal Normal  L. Pronator teres Normal None None None _______ Normal Normal Normal Normal  L. First dorsal interosseous Normal None None None _______ Normal Normal Normal Normal  L. Cervical paraspinals (low) Normal None None None _______ Normal Normal Normal Normal  L. Lumbar paraspinals (low) Normal None None None _______ Normal Normal Normal Normal  L. Iliopsoas Normal None None None _______ Normal Normal Normal Normal  L. Vastus medialis Normal None None None _______ Normal Normal Normal Normal  L. Tibialis anterior Normal None None None _______ Normal Normal Normal Normal  L. Gastrocnemius (Medial head) Normal None None None  _______ Normal Normal Normal Normal  L. Biceps femoris (long head) Normal None None None _______ Normal Normal Normal Normal  L. Gluteus maximus Normal None None None _______ Normal Normal Normal Normal  L. Gluteus medius Normal None None None _______ Normal Normal Normal Normal

## 2020-04-30 ENCOUNTER — Telehealth: Payer: Self-pay | Admitting: *Deleted

## 2020-04-30 NOTE — Telephone Encounter (Addendum)
Called patient, introduced myself, and  she hung up phone. Called again and she ended call again after introducing myself.  Patient immediately called back. I informed her there are expected arthritic changes in the cervical spine however the spinal cord looks normal so no reason for symptoms seen in the cervical spine. There are also expected arthritic changes in the lower spine but nothing that would cause her symptoms.  Patient verbalized understanding, appreciation.

## 2020-05-23 ENCOUNTER — Encounter: Payer: Self-pay | Admitting: Internal Medicine

## 2020-05-23 ENCOUNTER — Ambulatory Visit (INDEPENDENT_AMBULATORY_CARE_PROVIDER_SITE_OTHER): Payer: Medicare Other | Admitting: Internal Medicine

## 2020-05-23 VITALS — BP 104/64 | HR 58 | Ht 64.0 in | Wt 120.0 lb

## 2020-05-23 DIAGNOSIS — R1013 Epigastric pain: Secondary | ICD-10-CM | POA: Diagnosis not present

## 2020-05-23 DIAGNOSIS — R109 Unspecified abdominal pain: Secondary | ICD-10-CM

## 2020-05-23 MED ORDER — AMITRIPTYLINE HCL 25 MG PO TABS: 25.0000 mg | ORAL_TABLET | Freq: Every day | ORAL | 0 refills | Status: DC

## 2020-05-23 NOTE — Patient Instructions (Signed)
We have sent the following medications to your pharmacy for you to pick up at your convenience: Amitriptyline 25 mg daily  Please call our office in 1 month to let us know how you are doing on the amitriptyline.  If you are age 83 or older, your body mass index should be between 23-30. Your Body mass index is 20.6 kg/m. If this is out of the aforementioned range listed, please consider follow up with your Primary Care Provider.  Due to recent changes in healthcare laws, you may see the results of your imaging and laboratory studies on MyChart before your provider has had a chance to review them.  We understand that in some cases there may be results that are confusing or concerning to you. Not all laboratory results come back in the same time frame and the provider may be waiting for multiple results in order to interpret others.  Please give [place] 48 hours in order for your provider to thoroughly review all the results before contacting the office for clarification of your results.

## 2020-05-23 NOTE — Progress Notes (Signed)
Subjective:    Patient ID: Megan Mercer, female    DOB: 06/30/1935, 85 y.o.   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  She is here alone today and was last seen in November 2021 by Nicoletta Ba, PA-C.  She reports that she has healed well and continues to be surveyed after splenic artery aneurysm coil embolization at [facility].  In January she had a CTA of the chest abdomen pelvis, see objective.  She reports that through this process and predating the splenic artery aneurysm she continues to have an epigastric abdominal pain.  This is not unbearable and does not seem to relate to eating.  It is not necessarily daily but there are some days where it lasts all day and she will just lay down all day.  It feels like a "soreness".  It is not burning.  She rarely if ever has heartburn.  Her appetite is good and she is able to eat 3 regular size meals per day.  There is not associated nausea.  She is having bowel movements daily though occasionally stools can be hard and she is taking a stool softener.  No diarrhea.  She is sleeping very poorly and feels hyper at times which she describes as "type A".  She is tried various different sleep medications including Ambien but this makes her feel tense and jittery.  She has having issues with her balance and is being seen by neurology and has had an MRI and physical therapy for the same.  About once a month she will have urgent loose stools with lower abdominal cramping.  No blood in stool no   Review of Systems As per HPI, otherwise  negative  Current Medications, Allergies, Past Medical History, Past Surgical History, Family History and Social History were reviewed in [employer] record.     Objective:   Physical Exam BP 104/64 (BP Location: Right Arm, Patient Position: Sitting, Cuff Size: Normal)   Pulse (!) 58   Ht 5\' 4"  (1.626 m)   Wt 120 lb (54.4 kg)   BMI 20.60 kg/m  Gen: awake, alert, NAD HEENT: anicteric CV: RRR, no mrg Pulm: CTA b/l Abd: soft, NT/ND, +BS throughout Ext: no c/c/e Neuro: nonfocal  EXAM:  CT Chest, Abdomen, and Pelvis with contrast   INDICATION: History of thoracic aortic dissection, splenic artery aneurysm,  aortic aneurysm.   TECHNIQUE: TAA protocol. Serial axial images from the thoracic inlet to the  pubic symphysis prior to and following the administration of intravenous  contrast. Dose reduction was obtained with Automatic Exposure Control (AEC)  or, if AEC could not be utilized, by manual adjustment of the mA and/or kV  according to patient size. Maximum intensity projection sequence images  obtained.  Complex image reconstruction post processing was likewise  performed as indicated to increase the sensitivity of detecting clinically  relevant  pathology   CONTRAST DOSE: Isovue-370, 50 mL.   COMPARISON: TAA protocol May 23, 2019.    Vascular:  Unchanged diameter of the ascending aorta at 3.1 cm, within normal limits.  Atherosclerotic changes of the aorta, without evidence of dissection or  medialized calcifications to suggest underlying aortic dissection. Origins  of the great vessels are normally patent.   Aortic arch measures 2.5 cm in diameter, and descending aorta measures 2.2  cm in diameter, within normal limits.   The previously described dissection flap of the descending thoracic aorta  is resolved, and the previously described focal dissection of the  diaphragmatic hiatus is also resolved/thrombosed.   Origin of the celiac and SMA  axes within normal limits. Focal proximal  dilatation of the splenic artery measuring up to 8 mm, unchanged from March  2021. Interval coil embolization of previously described partially  thrombosed pseudoaneurysm at the splenic hilum with decreased size of  thrombosed pseudoaneurysm body, currently measuring 3.4 x 3.5 cm  (previously 4.8 x 4.5 cm).   Possible calcifications at the right renal artery, without flow-limiting  stenosis. IMA is normally patent. Atherosclerotic calcifications of the  bilateral iliac segments, without flow-limiting stenosis.   Small focal dissection flap of the right external iliac artery is  unchanged, without progression, without flow impedance.   CHEST:   LUNG/PLEURA/AIRWAYS:  Several bilateral pulmonary nodules are again seen; nodularity along the  right major fissure measures up to 1 cm on series  5, image 240, and up to  8 mm on image 242, increased from prior (previously 5 mm). Adjacent  tree-in-bud nodularity may reflect acute infectious/inflammatory  exacerbating process.   Left lingular scarring unchanged, with dominant nodule in this region  unchanged at 4 mm (series 5, image 193).   Dependent atelectatic changes. No consolidative airspace disease. No  pleural effusions or pneumothorax. No endobronchial lesions.   MEDIASTINUM/GREAT VESSELS: Subcentimeter thyroid nodule unchanged.  Unchanged left atrial enlargement. Redemonstrated patulous esophagus.. No  pericardial effusion. No pathologic mediastinal or hilar adenopathy. No  filling defects within the pulmonary arterial tree to suggest underlying  pulmonary emboli.   ABDOMEN:   Liver/gallbladder: Normal in attenuation. No discrete lesion seen.  Postcholecystectomy.. No intra or extrahepatic biliary ductal dilatation   Pancreas: Redemonstrated dilation of the main pancreatic duct. 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   Adrenal glands: Unremarkable.   Kidneys: No hydronephrosis. Right renal cyst unchanged with additional  subcentimeter hypodensities too small to further characterize. Symmetric  renal enhancement.   Bowel: Normal in caliber without evidence of inflammatory changes. No free  intraperitoneal air. Unchanged trace pelvic ascites.   Pelvic viscera: Unremarkable.   Other: No abdominal or pelvic lymphadenopathy.   BONES: No acute osseous abnormality. Multilevel degenerative changes of the  thoracolumbar spine, overall unchanged   Impression:   1. Since the March 2021 examination, there is complete resolution of the  multifocal dissections in the descending thoracic aorta, currently without  thoracic or abdominal aortic aneurysm, dissection, or intramural hematoma.   2. Unchanged small focal dissection of the external iliac artery, without  flow limitation.   3. 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 Several bilateral pulmonary nodules again seen; multiple nodules at the  lingula are unchanged, with dominant nodule measuring 4 to 5 mm on series  5, image 193. There is increased size of nodularity along the right minor  fissure measuring up to 1 cm on series 5, image 240, although associated  tree-in-bud nodularity may reflect exacerbating acute  infectious/inflammatory process. Thus short-term follow-up CT chest at 3  months is recommended to evaluate progression/resolution.   5. Stable 9 mm cystic lesion at the pancreatic neck, seemingly in  continuity with the main pancreatic duct, possibly representing IPMN versus  benign pancreatic cyst; in this age range, 2 year follow-up CT abdomen is  indicated to exclude growth.    6. Otherwise no CT evidence of acute pathology within the chest, abdomen or  pelvis.    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  1.  Chronic epigastric pain --she has had multiple imaging studies and the pain predates issues with a splenic artery aneurysm, aortic dissection and subsequent healing.  This is likely functional in nature.  Previous endoscopic evaluations have been unremarkable.  We discussed this today.  Pain does not respond to PPI.  I recommended that we try a TCA for functional abdominal pain --Amitriptyline 25 mg nightly; may need to dose titrate.  She is asked to notify me in a month by telephone or MyChart message as to whether this medication is helpful  2.  Pancreatic cyst --seen by CTA as above, 9 mm in the pancreatic neck.  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  Repeat cross-sectional imaging in 2 years around January 2024  3.  Poor sleep --amitriptyline may help with this symptom.  See #1.  I cautioned her that she should not use other sleep medications including Ambien with amitriptyline.  4.  Chronic atrial fibrillation --she continues on propafenone, metoprolol, amiodarone and apixaban.  30 minutes total spent today including patient facing time, coordination of care, reviewing medical history/procedures/pertinent radiology studies, and documentation of the encounter.

## 2020-07-28 ENCOUNTER — Telehealth: Payer: Self-pay | Admitting: Neurology

## 2020-07-28 NOTE — Telephone Encounter (Signed)
Pt has called stating she needs a call back. When asked about what pt states the matter is complex just let RN knows she is interested in more testing.  If pt can not be reached on her home# please call [phone]

## 2020-07-29 NOTE — Telephone Encounter (Signed)
I called the patient back and LVM on both home and alternate number with our office number for a return call.  I also advised that Dr. Jaynee Eagles is out of the office this week.

## 2020-07-31 NOTE — Telephone Encounter (Signed)
I returned the patient's call.  She stated that her balance has been getting worse.  She fell in the bathroom but did not have any injury.  She states she cannot put her clothes on without pushing up against a wall.  She stated that she had mentioned this to Dr. Jaynee Eagles at the last office visit.  She also reported that every once in a while her left leg feels so weak it starts to shake.  Patient stated she has seen physical therapy 3 times and has another session coming up next week.  She wanted to know if there is a test she could do for MS or ALS.  I advised the patient that her EMG was essentially normal but I would send a message over to Dr. Jaynee Eagles.  Patient stated if she does not hear back from a she will assume there is nothing further that can be done and she will call if needed.  I advised that I would give her a call back once I hear from Dr. Jaynee Eagles.  She verbalized appreciation and she understands Dr. Jaynee Eagles is currently out of the office.

## 2020-07-31 NOTE — Telephone Encounter (Signed)
Pt called, have a question regarding balance to ask Bethany. Would like a call back.

## 2020-08-04 NOTE — Telephone Encounter (Signed)
Spoke with Dr Jaynee Eagles. Ok to schedule an appointment to discuss further. Spoke with patient and scheduled her for Tuesday July 19th at 10:30 AM, arrival 10:00. Pt verbalized appreciation.

## 2020-09-09 ENCOUNTER — Ambulatory Visit (INDEPENDENT_AMBULATORY_CARE_PROVIDER_SITE_OTHER): Payer: Medicare Other | Admitting: Neurology

## 2020-09-09 ENCOUNTER — Encounter: Payer: Self-pay | Admitting: Neurology

## 2020-09-09 VITALS — BP 149/71 | HR 59 | Ht 64.0 in | Wt 121.0 lb

## 2020-09-09 DIAGNOSIS — R269 Unspecified abnormalities of gait and mobility: Secondary | ICD-10-CM | POA: Diagnosis not present

## 2020-09-09 DIAGNOSIS — R2689 Other abnormalities of gait and mobility: Secondary | ICD-10-CM | POA: Diagnosis not present

## 2020-09-09 DIAGNOSIS — W19XXXD Unspecified fall, subsequent encounter: Secondary | ICD-10-CM

## 2020-09-09 NOTE — Patient Instructions (Signed)
[facility] Inquire if they believe you should see their "Movement DIsorders Team" OR the "[facility]"

## 2020-09-09 NOTE — Progress Notes (Signed)
[facility]    Provider:  Dr Jaynee Eagles Requesting Provider: Pomposini, Cherly Anderson, MD Primary Care Provider:  Clinton Quant, MD  CC:  Poor balance, worsening  Interval history: Patient returns today after a thorough evaluation for her many symptoms including balance disorder for years status post physical therapy multiple times, coldness in her legs, she sometimes feels tremors and muscle weakness, dropping things, left-sided weakness, she is going to many doctors for her dizziness and feels no one cares, she has been to multiple doctors.  Work-up to date included B12 and folate, B1, B6, heavy metals, multiple myeloma panel, CK, and MMA which were normal and she has had extensive blood work from other physicians as well.  EMG nerve conduction study of the left arm and left leg was normal.  MRI of the cervical spine did not show any significant central stenosis that would be causing any imbalance or ataxia.  The MRI of the cervical and the lumbar spine did show arthritic/degenerative changes but no significant central canal stenosis.  Probable compression of the left L5 nerve root was noted.  CT of the head in November 2021 was unremarkable, some mild chronic small vessel ischemic disease.  She feels like she is worsening, in the mornings she feels very imbalanced, she is afraid she is going to fall, she has been ongoing with physical therapy, patient requests a referral to [facility].   HPI:  Megan Mercer is a 85 y.o. female here as requested by Pomposini, Cherly Anderson, MD for balance disorder. 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 fib, rectal bleeding, pancreatic lesion, hypothyroidism, thrombocytosis, chronic anticoagulation, anemia, hypertension, lumbar back pain, arthritis, osteopenia.  I reviewed requesting provider's notes, patient having problems with increasing balance issues, multiple falls recently fell and strained her right  knee and right ankle, was concerned about her balance disorder and having this investigated further, she has soreness to her right ankle and knee since the fall, prescription for PT was given to work on balance training and gait training, and sent for evaluation to neurology.  She says she has a list of things to speak to me about. She says she has many issues. She has had poor balance for years. She has been to PT 3-4 different times. She is cold all the time. The inner part of her lower legs get cold. Once in a while she sees tremors and feels her muscles are weak. She is dropping things with her left hand. She used to go to the gym, she feels she has aged 100 years. She has no energy. She is worried she has parkinson's disease. Her legs get cold. She has "gone to more doctors about my dizziness and no one seems to care". Equilibrium is off. She has had falls, when she goes to take her shower she holds on and she is terrified she is going to fall. She has some tingling in one spot in her leg but she can't really tell me where. She has been to multiple doctors, been to [employer]. Slowly progressive.   Reviewed notes, labs and imaging from outside physicians, which showed:  CT HEAD FINDINGS  personally reviewed imaging and agree  Brain:   Cerebral volume is normal.   Mild ill-defined hypoattenuation within the cerebral white matter is nonspecific, but compatible with chronic small vessel ischemic disease.   There is no acute intracranial hemorrhage.   No demarcated cortical infarct.   No extra-axial fluid collection.  No evidence of intracranial mass.   No midline shift.   Vascular: No hyperdense vessel.  Atherosclerotic calcifications.   Skull: Normal. Negative for fracture or focal lesion.   Sinuses/Orbits: Visualized orbits show no acute finding. Small right maxillary sinus mucous retention cyst.   CT CERVICAL SPINE FINDINGS   Alignment: Straightening of the expected cervical  lordosis. Trace C5-C6 grade 1 retrolisthesis. 2 mm C7-T1 grade 1 anterolisthesis, unchanged.   Skull base and vertebrae: The basion-dental and atlanto-dental intervals are maintained.No evidence of acute fracture to the cervical spine.   Soft tissues and spinal canal: No prevertebral fluid or swelling. No visible canal hematoma.   Disc levels: Cervical spondylosis. Most notably at C5-C6 and C6-C7, there is advanced disc space narrowing with posterior disc osteophytes and uncovertebral hypertrophy. Bilateral neural foraminal narrowing and at least mild spinal canal stenosis at these levels.   Upper chest: No consolidation within the imaged lung apices. No visible pneumothorax.   Other: 7 mm rounded focus of calcification within the posterolateral spinal canal at the T2-T3 level (series 7, image 84). Resultant at least mild spinal canal stenosis at this level.   IMPRESSION: Personally reviewed imaging and agree with the following: CT head:   1. No evidence of acute intracranial abnormality. 2. Mild chronic small vessel ischemic disease. 3. Small right maxillary sinus mucous retention cyst.   CT cervical spine:   1. No evidence of acute fracture to the cervical spine. 2. Trace C5-C6 grade 1 retrolisthesis. 2 mm C7-T1 grade 1 retrolisthesis. Findings are unchanged and likely degenerative. 3. Cervical spondylosis as described and greatest at C5-C6 and C6-C7. 4. 7 mm rounded focus of calcification within the posterolateral spinal canal at the T2-T3 level. This may reflect an incidental small meningioma or ligamentum flavum calcification. Resultant at least mild spinal canal stenosis at this level.  MRI brain 2016 report only [facility]  FINDINGS: Mild-moderate patchy T2/FLAIR hyperintensities are present within  the periventricular and subcortical white matter, consistent with chronic  small vessel ischemic changes and within normal limits for patient's age.  There is no  disproportionate lobar atrophy. Though the sensitivity for  detection of Alzheimers disease on conventional MRI is limited, there is no  specific evidence of disproportionate atrophy of the hippocampi, medial  temporal lobes, or Sylvian cisterns to specifically suggest this. No  disproportionate ventricular enlargement. The extra-axial spaces and basal  cisterns are [normal]. There is no acute cortical infarct, intracranial  hemorrhage, mass, or mass-effect. Intracranial flow-voids appear normal.  The orbits, cranium, and mastoid sinuses are unremarkable. Prior bilateral  cataract surgery.  Small maxillary sinus mucus retention cysts.   IMPRESSION:  No specific etiology for memory loss is identified   MRI+I lumbar spine [facility] 2013  FINDINGS: Mild-moderate patchy T2/FLAIR hyperintensities are present within  the periventricular and subcortical white matter, consistent with chronic  small vessel ischemic changes and within normal limits for patient's age.  There is no disproportionate lobar atrophy. Though the sensitivity for  detection of Alzheimers disease on conventional MRI is limited, there is no  specific evidence of disproportionate atrophy of the hippocampi, medial  temporal lobes, or Sylvian cisterns to specifically suggest this. No  disproportionate ventricular enlargement. The extra-axial spaces and basal  cisterns are [normal]. There is no acute cortical infarct, intracranial  hemorrhage, mass, or mass-effect. Intracranial flow-voids appear normal.  The orbits, cranium, and mastoid sinuses are unremarkable. Prior bilateral  cataract surgery.  Small maxillary sinus mucus retention cysts.   IMPRESSION:  No specific  etiology for memory loss is identified   Review of Systems: Patient complains of symptoms per HPI as well as the following symptoms: imbalance. Pertinent negatives and positives per HPI. All others negative.   Social History   Socioeconomic History   Marital  status: Single    Spouse name: Not on file   Number of children: 0   Years of education: Not on file   Highest education level: Master's degree (e.g., MA, MS, MEng, MEd, MSW, MBA)  Occupational History   Occupation: RETIRED  Tobacco Use   Smoking status: Never   Smokeless tobacco: Never  Vaping Use   Vaping Use: Never used  Substance and Sexual Activity   Alcohol use: Never    Alcohol/week: 0.0 standard drinks    Comment: rare   Drug use: Never   Sexual activity: Not on file  Other Topics Concern   Not on file  Social History Narrative   Lives at home alone   Caffeine: occasionally   Right handed   Social Determinants of Health   Financial Resource Strain: Not on file  Food Insecurity: Not on file  Transportation Needs: Not on file  Physical Activity: Not on file  Stress: Not on file  Social Connections: Not on file  Intimate Partner Violence: Not on file    Family History  Problem Relation Age of Onset   Cancer Father        blood cancer, ? type   Rheum arthritis Father    Glaucoma Father    Rheum arthritis Mother    Arthritis Sister    Diabetes Sister        TYPE 2   Heart failure Sister    Hyperlipidemia Sister    High blood pressure Other    Colon cancer Neg Hx    Esophageal cancer Neg Hx    Pancreatic cancer Neg Hx    Stomach cancer Neg Hx    Liver disease Neg Hx    Parkinson's disease Neg Hx     Past Medical History:  Diagnosis Date   Anemia    Anxiety    Arthritis    Atrial fibrillation ([place])    Barrett's esophagus    Blood transfusion without reported diagnosis    Chronic paronychia of finger, left    Complication of anesthesia    Dysrhythmia    Fatigue    Finger lesion    Ganglion    GERD (gastroesophageal reflux disease)    Glaucoma    Heart disease    Hiatal hernia    HLD (hyperlipidemia)    HTN (hypertension)    Hypothyroidism    Internal hemorrhoids    Iron deficiency anemia    Osteopenia    Osteoporosis    Pancreatic cyst     Peptic ulcer disease with hemorrhage    Plantar fasciitis    PONV (postoperative nausea and vomiting)    Confusion, Combativeness   Rheumatism    Shortness of breath    UTI (urinary tract infection)     Patient Active Problem List   Diagnosis Date Noted   Dissecting aneurysm of thoracic aorta, [place] type B ([place]) 12/28/2018   Dissection of ascending aorta ([place]) 12/27/2018   Aortic dissection ([place]) 12/27/2018   Abdominal aortic aneurysm ([place]) 12/27/2018   Hypothyroidism 12/27/2018   Aneurysm, 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[place]) 12/27/2018   Paroxysmal atrial fibrillation ([place]) 12/27/2018   Chronic anticoagulation 12/27/2018   Thrombocytosis 12/27/2018   Lesion of pancreas    Rectal bleeding 09/23/2016  Internal hemorrhoid 09/23/2016   Anemia, iron deficiency 05/14/2014    Past Surgical History:  Procedure Laterality Date   ABDOMINAL HYSTERECTOMY  1984   APPENDECTOMY  2007   CATARACT EXTRACTION Bilateral    CHOLECYSTECTOMY  2007   COLONOSCOPY     ESOPHAGOGASTRODUODENOSCOPY (EGD) WITH PROPOFOL N/A 11/30/2018   Procedure: ESOPHAGOGASTRODUODENOSCOPY (EGD) WITH PROPOFOL;  Surgeon: Milus Banister, MD;  Location: [facility];  Service: Endoscopy;  Laterality: N/A;   EUS N/A 11/30/2018   Procedure: UPPER ENDOSCOPIC ULTRASOUND (EUS) RADIAL;  Surgeon: Milus Banister, MD;  Location: [facility];  Service: Endoscopy;  Laterality: N/A;   IR EMBO ARTERIAL NOT HEMORR HEMANG INC GUIDE ROADMAPPING  12/27/2018   IR RADIOLOGIST EVAL & MGMT  12/14/2018   IR RADIOLOGIST EVAL & MGMT  04/05/2019   IR RADIOLOGIST EVAL & MGMT  04/26/2019   IR RADIOLOGIST EVAL & MGMT  05/15/2019   IR US GUIDE VASC ACCESS RIGHT  12/27/2018   spleen aneurysm     IR procedure to repair; had [facility] in 2021   UPPER GASTROINTESTINAL ENDOSCOPY      Current Outpatient Medications  Medication Sig Dispense Refill   amiodarone (PACERONE) 200 MG tablet Take 200 mg by mouth daily.     amitriptyline (ELAVIL) 25 MG tablet Take 1  tablet (25 mg total) by mouth at bedtime. 30 tablet 0   apixaban (ELIQUIS) 2.5 MG TABS tablet Take 2.5 mg by mouth 2 (two) times daily.      Calcium Carb-Cholecalciferol (CALCIUM-VITAMIN D) 600-400 MG-UNIT TABS Take 1 tablet by mouth daily.     colestipol (COLESTID) 1 G tablet Take 2 g by mouth 2 (two) times daily.      ezetimibe (ZETIA) 10 MG tablet Take 10 mg by mouth daily.     latanoprost (XALATAN) 0.005 % ophthalmic solution Place 1 drop into both eyes at bedtime.     levothyroxine (SYNTHROID, LEVOTHROID) 75 MCG tablet Take 75 mcg by mouth daily before breakfast.     metoprolol tartrate (LOPRESSOR) 50 MG tablet Take 50 mg by mouth 2 (two) times daily.     mirtazapine (REMERON) 15 MG tablet Take 15 mg by mouth at bedtime.     propafenone (RYTHMOL) 225 MG tablet Take 1 tablet (225 mg total) by mouth every 8 (eight) hours. 90 tablet 0   raloxifene (EVISTA) 60 MG tablet Take 60 mg by mouth at bedtime.      timolol (BETIMOL) 0.5 % ophthalmic solution Place 1 drop into both eyes daily.     No current facility-administered medications for this visit.    Allergies as of 09/09/2020 - Review Complete 09/09/2020  Allergen Reaction Noted   Ace inhibitors Cough    Amoxicillin Other (See Comments) 02/18/2017   Ansaid [flurbiprofen]  04/07/2020   Aspirin     Atorvastatin Other (See Comments) 02/18/2017   Crestor  [rosuvastatin calcium]  10/17/2017   Iodine Nausea Only 08/09/2016   Lipitor  [atorvastatin calcium]  10/17/2017   Motrin [ibuprofen]  04/07/2020   Oxycodone Nausea And Vomiting 10/10/2015   Oxycodone-acetaminophen Nausea And Vomiting 07/09/2014   Paroxetine hcl Nausea Only 11/03/2015   Sulfa antibiotics Nausea And Vomiting 05/14/2014   Sulfasalazine Nausea And Vomiting 05/14/2014   Codeine Nausea And Vomiting 05/14/2014    Vitals: BP (!) 149/71   Pulse (!) 59   Ht '5\' 4"'  (1.626 m)   Wt 121 lb (54.9 kg)   BMI 20.77 kg/m  Last Weight:  Wt Readings from Last 1  Encounters:   09/09/20 121 lb (54.9 kg)   Last Height:   Ht Readings from Last 1 Encounters:  09/09/20 '5\' 4"'  (1.626 m)     Physical exam: Exam: Gen: NAD, anxious, conversant, well nourised, thin , well groomed                   CV: RRR, no MRG. No Carotid Bruits. No peripheral edema, warm, nontender Eyes: Conjunctivae clear without exudates or hemorrhage  Neuro: Detailed Neurologic Exam  Speech:    Speech is normal; fluent and spontaneous with normal comprehension.  Cognition:    The patient is oriented to person, place, and time;     recent and remote memory intact;     language fluent;     normal attention, concentration,     fund of knowledge Cranial Nerves:    The pupils are equal, round, and reactive to light. Pupils too small to visualize fundi. . Visual fields are full to finger confrontation. Extraocular movements are intact. Trigeminal sensation is intact and the muscles of mastication are normal. The face is symmetric. The palate elevates in the midline. Hearing intact. Voice is normal. Shoulder shrug is normal. The tongue has normal motion without fasciculations.   Coordination:    Normal finger to nose    Gait:    Good arm swing and stride. Slight imbalance with tandem. Can heel and toe walk..   Motor Observation:     no involuntary movements noted. Tone:    Normal muscle tone.    Posture:    Posture is normal. normal erect    Strength: hip flexion 4/5 bilat. Otherwise strength is V/V in the upper and lower limbs.      Sensation: intact to LT     Reflex Exam:  DTR's:    Deep tendon reflexes in the upper and lower extremities are normal bilaterally.   Toes:    The toes are downgoing bilaterally.   Clonus:    Clonus is absent.    Assessment/Plan:  Really lovely  85 y.o. female here as requested by Reynaldo Minium, MD for balance disorder. 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 fib, rectal bleeding, pancreatic lesion,  hypothyroidism, thrombocytosis, chronic anticoagulation, anemia, hypertension, lumbar back pain, arthritis, osteopenia  - patient requests referral to [facility] for imbalance not dizzy, gait abnormality, no walking properly, near falls. She feels like she is worsening, in the mornings she feels very imbalanced, she is afraid she is going to fall, she has been ongoing with physical therapy, patient requests a referral to [facility]. No signs of parkinsonism.   Work-up to date included B12 and folate, B1, B6, heavy metals, multiple myeloma panel, CK, and MMA which were normal and she has had extensive blood work from other physicians as well.  EMG nerve conduction study of the left arm and left leg was normal.  MRI of the cervical spine did not show any significant central stenosis that would be causing any imbalance or ataxia.  The MRI of the cervical and the lumbar spine did show arthritic/degenerative changes but no significant central canal stenosis.  Probable compression of the left L5 nerve root was noted.  CT of the head in November 2021 was unremarkable, some mild chronic small vessel ischemic disease.  - patient has seen multiple physicians and neurologists in the past for chronic lightheadedness and dizziness. She also appears anxious which may be contributing to her symptoms. She says she has a long  list of issues including: Chronic dizziness and lightheadedness for many years, poor balance for many years, left-sided weakness, dropping things with her left hand, no energy, feeling cold, equilibrium impairment, tingling in her limbs, she states she has been to multiple doctors even up at [facility], no one's been able to give her any answer.     Orders Placed This Encounter  Procedures   Ambulatory referral to Neurology    No orders of the defined types were placed in this encounter.   Cc: Pomposini, Cherly Anderson, MD,  Pomposini, Cherly Anderson, MD  Sarina Ill, MD  [facility] [address] [place], [place] [address]  Phone [phone] Fax [phone]  I spent 30 minutes of face-to-face and non-face-to-face time with patient on the  1. Imbalance   2. Gait abnormality   3. Fall, subsequent encounter    diagnosis.  This included previsit chart review, lab review, study review, order entry, electronic health record documentation, patient education on the different diagnostic and therapeutic options, counseling and coordination of care, risks and benefits of management, compliance, or risk factor reduction

## 2020-10-06 ENCOUNTER — Telehealth: Payer: Self-pay

## 2020-10-06 NOTE — Telephone Encounter (Signed)
I spoke with this patient today regarding lab work ordered for her. She advised that insurance (medicare) denied some of the lab work. She spoke with [facility] and they advised that you would need to send a letter to Medicare advising that the lab work was medically necessary due to her prognosis.   Can you please advise?

## 2020-10-08 NOTE — Telephone Encounter (Signed)
Pt has asked the following be noted.  Re: the blood work that she had on 04-07-20 her insurance is not fully paying the cost.  Pt states along with the proper code and it being noted that this blood work was a medical necessity , a letter is needed to [place] is asking the invoice# [id number] be referenced.  Pt is asking the letter be faxed to [place] @ [phone] their ph# is [phone]

## 2020-10-09 NOTE — Telephone Encounter (Signed)
I called [facility] and spoke with Maddie in billing. Provided updated code for lab that was not covered to see if this can now be covered. I was told the lab will be resubmitted to insurance the patient can expect an updated bill in 30-45 days.   I called the patient and LVM (ok per DPR) advising I spoke with [facility] and provided updated information.  Advised patient that she can disregard her current bill and expected updated bill within 30 to 45 days.  Left office number in case she has further questions.

## 2020-12-01 ENCOUNTER — Telehealth: Payer: Self-pay | Admitting: Internal Medicine

## 2020-12-01 MED ORDER — ONDANSETRON 4 MG PO TBDP: 4.0000 mg | ORAL_TABLET | Freq: Three times a day (TID) | ORAL | 1 refills | Status: DC | PRN

## 2020-12-01 NOTE — Telephone Encounter (Signed)
We tried amitriptyline at low-dose when I saw her last in April Did this help with the upper abdominal pain?  If so and if tolerated we could increase to 50 mg at bedtime She can try ondansetron ODT 4 mg up to 3 times a day as needed for nausea

## 2020-12-01 NOTE — Telephone Encounter (Signed)
Pt could not tell me if she was still taking the elavil or if it helped. She requests the zofran be called and when her out of town guest has left she will call back and schedule an appt.

## 2020-12-01 NOTE — Telephone Encounter (Signed)
Inbound call from pt requesting a call back stating that she is having some abd pain and she is feeling nauseous. Please advise. Thank you

## 2020-12-01 NOTE — Telephone Encounter (Signed)
Pt states she has been having nausea, light headedness, along with her stomach hurting for about 3 weeks. She has not actually vomited but has had quite a bit of nausea. Pt reports she saw her PCP and he treated her with some sea sickness pills that have not helped. Pt wants to know if there is something Dr. Hilarie Fredrickson can send in for her nausea. Please advise.

## 2020-12-10 ENCOUNTER — Emergency Department (HOSPITAL_COMMUNITY): Payer: Medicare Other

## 2020-12-10 ENCOUNTER — Emergency Department (HOSPITAL_COMMUNITY)
Admission: EM | Admit: 2020-12-10 | Discharge: 2020-12-10 | Disposition: A | Discharge status: Home / Self Care | Payer: Medicare Other | Attending: Emergency Medicine | Admitting: Emergency Medicine

## 2020-12-10 ENCOUNTER — Other Ambulatory Visit: Payer: Self-pay

## 2020-12-10 ENCOUNTER — Encounter (HOSPITAL_COMMUNITY): Payer: Self-pay | Admitting: *Deleted

## 2020-12-10 DIAGNOSIS — R531 Weakness: Secondary | ICD-10-CM | POA: Diagnosis not present

## 2020-12-10 DIAGNOSIS — E039 Hypothyroidism, unspecified: Secondary | ICD-10-CM | POA: Diagnosis not present

## 2020-12-10 DIAGNOSIS — I4891 Unspecified atrial fibrillation: Secondary | ICD-10-CM | POA: Insufficient documentation

## 2020-12-10 DIAGNOSIS — I10 Essential (primary) hypertension: Secondary | ICD-10-CM | POA: Insufficient documentation

## 2020-12-10 DIAGNOSIS — Z7901 Long term (current) use of anticoagulants: Secondary | ICD-10-CM | POA: Diagnosis not present

## 2020-12-10 DIAGNOSIS — R110 Nausea: Secondary | ICD-10-CM | POA: Insufficient documentation

## 2020-12-10 DIAGNOSIS — Z79899 Other long term (current) drug therapy: Secondary | ICD-10-CM | POA: Diagnosis not present

## 2020-12-10 DIAGNOSIS — R109 Unspecified abdominal pain: Secondary | ICD-10-CM | POA: Insufficient documentation

## 2020-12-10 LAB — COMPREHENSIVE METABOLIC PANEL
ALT: 21 U/L (ref 0–44)
AST: 22 U/L (ref 15–41)
Albumin: 3.5 g/dL (ref 3.5–5.0)
Alkaline Phosphatase: 62 U/L (ref 38–126)
Anion gap: 6 (ref 5–15)
BUN: 12 mg/dL (ref 8–23)
CO2: 28 mmol/L (ref 22–32)
Calcium: 8.1 mg/dL — ABNORMAL LOW (ref 8.9–10.3)
Chloride: 97 mmol/L — ABNORMAL LOW (ref 98–111)
Creatinine, Ser: 0.85 mg/dL (ref 0.44–1.00)
GFR, Estimated: >60 mL/min (ref >60)
Glucose, Bld: 67 mg/dL — ABNORMAL LOW (ref 70–99)
Potassium: 3.7 mmol/L (ref 3.5–5.1)
Sodium: 131 mmol/L — ABNORMAL LOW (ref 135–145)
Total Bilirubin: 0.9 mg/dL (ref 0.3–1.2)
Total Protein: 6.5 g/dL (ref 6.5–8.1)

## 2020-12-10 LAB — CBC WITH DIFFERENTIAL/PLATELET
Abs Immature Granulocytes: 0.04 10*3/uL (ref 0.00–0.07)
Basophils Absolute: 0.1 10*3/uL (ref 0.0–0.1)
Basophils Relative: 1 %
Eosinophils Absolute: 0.2 10*3/uL (ref 0.0–0.5)
Eosinophils Relative: 2 %
HCT: 41.8 % (ref 36.0–46.0)
Hemoglobin: 13.6 g/dL (ref 12.0–15.0)
Immature Granulocytes: 0 %
Lymphocytes Relative: 9 %
Lymphs Abs: 0.9 10*3/uL (ref 0.7–4.0)
MCH: 31.9 pg (ref 26.0–34.0)
MCHC: 32.5 g/dL (ref 30.0–36.0)
MCV: 98.1 fL (ref 80.0–100.0)
Monocytes Absolute: 1 10*3/uL (ref 0.1–1.0)
Monocytes Relative: 10 %
Neutro Abs: 7.5 10*3/uL (ref 1.7–7.7)
Neutrophils Relative %: 78 %
Platelets: 441 10*3/uL — ABNORMAL HIGH (ref 150–400)
RBC: 4.26 MIL/uL (ref 3.87–5.11)
RDW: 14.9 % (ref 11.5–15.5)
WBC: 9.6 10*3/uL (ref 4.0–10.5)
nRBC: 0 % (ref 0.0–0.2)

## 2020-12-10 LAB — LIPASE, BLOOD: Lipase: 31 U/L (ref 11–51)

## 2020-12-10 MED ORDER — METOCLOPRAMIDE HCL 10 MG PO TABS: 10.0000 mg | ORAL_TABLET | Freq: Three times a day (TID) | ORAL | 0 refills | Status: DC | PRN

## 2020-12-10 MED ORDER — PANTOPRAZOLE SODIUM 20 MG PO TBEC: 20.0000 mg | DELAYED_RELEASE_TABLET | Freq: Every day | ORAL | 0 refills | Status: AC

## 2020-12-10 MED ORDER — METOCLOPRAMIDE HCL 5 MG/ML IJ SOLN
10.0000 mg | Freq: Once | INTRAMUSCULAR | Status: AC
  Administered 2020-12-10: 10 mg via INTRAVENOUS
  Filled 2020-12-10: qty 2

## 2020-12-10 MED ORDER — SODIUM CHLORIDE 0.9 % IV BOLUS
500.0000 mL | Freq: Once | INTRAVENOUS | Status: AC
  Administered 2020-12-10: 500 mL via INTRAVENOUS

## 2020-12-10 MED ORDER — IOHEXOL 300 MG/ML  SOLN
100.0000 mL | Freq: Once | INTRAMUSCULAR | Status: AC | PRN
  Administered 2020-12-10: 100 mL via INTRAVENOUS

## 2020-12-10 MED ORDER — PANTOPRAZOLE SODIUM 40 MG IV SOLR
40.0000 mg | Freq: Once | INTRAVENOUS | Status: AC
  Administered 2020-12-10: 40 mg via INTRAVENOUS
  Filled 2020-12-10: qty 40

## 2020-12-10 NOTE — ED Triage Notes (Addendum)
Pt c/o abdominal pain, nausea, weakness, SOB and dizziness for "a long time". Pt reports her symptoms have been getting worse over the last few months. Pt was diagnosed with Covid on 11/06/20 but doesn't feel like her symptoms have been any worse since having Covid. Pt has appt to see GI doctor but it isn't until February 2023. She feels her symptoms have drastically compromised her life and she "can't function".

## 2020-12-10 NOTE — Discharge Instructions (Addendum)
Follow-up with your gastroenterologist in the next couple weeks.  And follow-up with the GYN doctor for the evaluation on your CT scan

## 2020-12-10 NOTE — ED Provider Notes (Signed)
[facility] Provider Note   CSN: [id number] Arrival date & time: 12/10/20  0849     History Chief Complaint  Patient presents with   Nausea    Megan Mercer is a 85 y.o. female.  Patient states she has been having nausea for a number of month.  No pain no vomiting or diarrhea  The history is provided by the patient and medical records. No language interpreter was used.  Weakness Severity:  Moderate Onset quality:  Gradual Timing:  Constant Progression:  Worsening Chronicity:  New Context: not alcohol use   Relieved by:  Nothing Worsened by:  Nothing Ineffective treatments:  None tried Associated symptoms: nausea   Associated symptoms: no abdominal pain, no aphasia, no chest pain, no cough, no diarrhea, no frequency, no headaches and no seizures       Past Medical History:  Diagnosis Date   Anemia    Anxiety    Arthritis    Atrial fibrillation (HCC)    Barrett's esophagus    Blood transfusion without reported diagnosis    Chronic paronychia of finger, left    Complication of anesthesia    Dysrhythmia    Fatigue    Finger lesion    Ganglion    GERD (gastroesophageal reflux disease)    Glaucoma    Heart disease    Hiatal hernia    HLD (hyperlipidemia)    HTN (hypertension)    Hypothyroidism    Internal hemorrhoids    Iron deficiency anemia    Osteopenia    Osteoporosis    Pancreatic cyst    Peptic ulcer disease with hemorrhage    Plantar fasciitis    PONV (postoperative nausea and vomiting)    Confusion, Combativeness   Rheumatism    Shortness of breath    UTI (urinary tract infection)     Patient Active Problem List   Diagnosis Date Noted   Dissecting aneurysm of thoracic aorta, [place] type B 12/28/2018   Dissection of ascending aorta 12/27/2018   Aortic dissection ([place]) 12/27/2018   Abdominal aortic aneurysm 12/27/2018   Hypothyroidism 12/27/2018   Aneurysm, 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[place]) 12/27/2018   Paroxysmal atrial  fibrillation ([place]) 12/27/2018   Chronic anticoagulation 12/27/2018   Thrombocytosis 12/27/2018   Lesion of pancreas    Rectal bleeding 09/23/2016   Internal hemorrhoid 09/23/2016   Anemia, iron deficiency 05/14/2014    Past Surgical History:  Procedure Laterality Date   ABDOMINAL HYSTERECTOMY  1984   APPENDECTOMY  2007   CATARACT EXTRACTION Bilateral    CHOLECYSTECTOMY  2007   COLONOSCOPY     ESOPHAGOGASTRODUODENOSCOPY (EGD) WITH PROPOFOL N/A 11/30/2018   Procedure: ESOPHAGOGASTRODUODENOSCOPY (EGD) WITH PROPOFOL;  Surgeon: Milus Banister, MD;  Location: [facility];  Service: Endoscopy;  Laterality: N/A;   EUS N/A 11/30/2018   Procedure: UPPER ENDOSCOPIC ULTRASOUND (EUS) RADIAL;  Surgeon: Milus Banister, MD;  Location: [facility];  Service: Endoscopy;  Laterality: N/A;   IR EMBO ARTERIAL NOT HEMORR HEMANG INC GUIDE ROADMAPPING  12/27/2018   IR RADIOLOGIST EVAL & MGMT  12/14/2018   IR RADIOLOGIST EVAL & MGMT  04/05/2019   IR RADIOLOGIST EVAL & MGMT  04/26/2019   IR RADIOLOGIST EVAL & MGMT  05/15/2019   IR US GUIDE VASC ACCESS RIGHT  12/27/2018   spleen aneurysm     IR procedure to repair; had at [facility] in 2021   [place]       OB History   No obstetric history on  file.     Family History  Problem Relation Age of Onset   Cancer Father        blood cancer, ? type   Rheum arthritis Father    Glaucoma Father    Rheum arthritis Mother    Arthritis Sister    Diabetes Sister        TYPE 2   Heart failure Sister    Hyperlipidemia Sister    High blood pressure Other    Colon cancer Neg Hx    Esophageal cancer Neg Hx    Pancreatic cancer Neg Hx    Stomach cancer Neg Hx    Liver disease Neg Hx    Parkinson's disease Neg Hx     Social History   Tobacco Use   Smoking status: Never   Smokeless tobacco: Never  Vaping Use   Vaping Use: Never used  Substance Use Topics   Alcohol use: Yes    Comment: rare   Drug use: Never    Home  Medications Prior to Admission medications   Medication Sig Start Date End Date Taking? Authorizing Provider  amiodarone (PACERONE) 200 MG tablet Take 200 mg by mouth daily. 11/23/19  Yes [provider]  apixaban (ELIQUIS) 2.5 MG TABS tablet Take 2.5 mg by mouth 2 (two) times daily.    Yes [provider]  Calcium Carb-Cholecalciferol (CALCIUM-VITAMIN D) 600-400 MG-UNIT TABS Take 1 tablet by mouth daily.   Yes [provider]  colestipol (COLESTID) 1 G tablet Take 2 g by mouth 2 (two) times daily.    Yes [provider]  ezetimibe (ZETIA) 10 MG tablet Take 10 mg by mouth daily.   Yes [provider]  latanoprost (XALATAN) 0.005 % ophthalmic solution Place 1 drop into both eyes at bedtime. 08/11/18  Yes [provider]  levothyroxine (SYNTHROID, LEVOTHROID) 75 MCG tablet Take 75 mcg by mouth daily before breakfast.   Yes [provider]  metoCLOPramide (REGLAN) 10 MG tablet Take 1 tablet (10 mg total) by mouth every 8 (eight) hours as needed for nausea. 12/10/20  Yes Milton Ferguson, MD  metoprolol tartrate (LOPRESSOR) 50 MG tablet Take 50 mg by mouth 2 (two) times daily.   Yes [provider]  pantoprazole (PROTONIX) 20 MG tablet Take 1 tablet (20 mg total) by mouth daily. 12/10/20  Yes Milton Ferguson, MD  raloxifene (EVISTA) 60 MG tablet Take 60 mg by mouth at bedtime.    Yes [provider]  SYNTHROID 100 MCG tablet Take 100 mcg by mouth daily. 11/02/20  Yes [provider]  timolol (BETIMOL) 0.5 % ophthalmic solution Place 1 drop into both eyes daily.   Yes [provider]  traZODone (DESYREL) 100 MG tablet Take 100 mg by mouth at bedtime as needed for sleep. 11/24/20  Yes [provider]  amitriptyline (ELAVIL) 25 MG tablet Take 1 tablet (25 mg total) by mouth at bedtime. Patient not taking: No sig reported 05/23/20   Pyrtle, Lajuan Lines, MD  ondansetron (ZOFRAN ODT) 4 MG disintegrating tablet Take  1 tablet (4 mg total) by mouth every 8 (eight) hours as needed for nausea or vomiting. Patient not taking: No sig reported 12/01/20   Pyrtle, Lajuan Lines, MD  propafenone (RYTHMOL) 225 MG tablet Take 1 tablet (225 mg total) by mouth every 8 (eight) hours. Patient not taking: No sig reported 01/04/19   Charolotte Capuchin, MD    Allergies    Ace inhibitors, Amoxicillin, Ansaid [flurbiprofen], Aspirin, Atorvastatin, Crestor  [  rosuvastatin calcium], Iodine, Lipitor  [atorvastatin calcium], Motrin [ibuprofen], Oxycodone, Oxycodone-acetaminophen, Paroxetine hcl, Sulfa antibiotics, Sulfasalazine, and Codeine  Review of Systems   Review of Systems  Constitutional:  Negative for appetite change and fatigue.  HENT:  Negative for congestion, ear discharge and sinus pressure.   Eyes:  Negative for discharge.  Respiratory:  Negative for cough.   Cardiovascular:  Negative for chest pain.  Gastrointestinal:  Positive for nausea. Negative for abdominal pain and diarrhea.  Genitourinary:  Negative for frequency and hematuria.  Musculoskeletal:  Negative for back pain.  Skin:  Negative for rash.  Neurological:  Positive for weakness. Negative for seizures and headaches.  Psychiatric/Behavioral:  Negative for hallucinations.    Physical Exam Updated Vital Signs BP (!) 176/77 (BP Location: Left Arm)   Pulse 60   Temp 97.6 F (36.4 C) (Oral)   Resp 12   Ht 5' 3.5" (1.613 m)   Wt 55.3 kg   SpO2 99%   BMI 21.27 kg/m   Physical Exam Vitals and nursing note reviewed.  Constitutional:      Appearance: She is well-developed.  HENT:     Head: Normocephalic.     Nose: Nose normal.  Eyes:     General: No scleral icterus.    Conjunctiva/sclera: Conjunctivae normal.  Neck:     Thyroid: No thyromegaly.  Cardiovascular:     Rate and Rhythm: Normal rate and regular rhythm.     Heart sounds: No murmur heard.   No friction rub. No gallop.  Pulmonary:     Breath sounds: No stridor. No wheezing or rales.   Chest:     Chest wall: No tenderness.  Abdominal:     General: There is no distension.     Tenderness: There is no abdominal tenderness. There is no rebound.  Musculoskeletal:        General: Normal range of motion.     Cervical back: Neck supple.  Lymphadenopathy:     Cervical: No cervical adenopathy.  Skin:    Findings: No erythema or rash.  Neurological:     Mental Status: She is alert and oriented to person, place, and time.     Motor: No abnormal muscle tone.     Coordination: Coordination normal.  Psychiatric:        Behavior: Behavior normal.    ED Results / Procedures / Treatments   Labs (all labs ordered are listed, but only abnormal results are displayed) Labs Reviewed  CBC WITH DIFFERENTIAL/PLATELET - Abnormal; Notable for the following components:      Result Value   Platelets 441 (*)    All other components within normal limits  COMPREHENSIVE METABOLIC PANEL - Abnormal; Notable for the following components:   Sodium 131 (*)    Chloride 97 (*)    Glucose, Bld 67 (*)    Calcium 8.1 (*)    All other components within normal limits  LIPASE, BLOOD    EKG None  Radiology CT ABDOMEN PELVIS W CONTRAST  Result Date: 12/10/2020 CLINICAL DATA:  Abdominal pain, weakness, nausea EXAM: CT ABDOMEN AND PELVIS WITH CONTRAST TECHNIQUE: Multidetector CT imaging of the abdomen and pelvis was performed using the standard protocol following bolus administration of intravenous contrast. CONTRAST:  157mL OMNIPAQUE IOHEXOL 300 MG/ML  SOLN COMPARISON:  04/24/2019 and previous FINDINGS: Lower chest: No pleural or pericardial effusion. Hepatobiliary: Cholecystectomy clips. There is mild intrahepatic biliary ductal dilatation in the left lobe, stable since previous. No focal liver lesion. 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 There is a 0.7 cm persistent enhancing region along its posterolateral margin, previously 1.3 cm.  No new pancreatic mass, hemorrhage, or adjacent inflammatory change. Spleen: Normal in size without focal abnormality. Adrenals/Urinary Tract: Adrenal glands unremarkable. Right kidney is ptotic, with a 4.7 cm upper pole cyst as before. No hydronephrosis. Stomach/Bowel: Stomach is incompletely distended. The small bowel is decompressed. Moderate colonic fecal material without acute finding. Vascular/Lymphatic: Moderate aortoiliac calcified plaque without aneurysm or stenosis. Minimal residual dissection flap in the proximal right external iliac artery, of doubtful hemodynamic significance. Embolization coils adjacent to the pancreatic tail with decreased enhancement in the pseudoaneurysm as above. No abdominal or pelvic adenopathy. Portal vein patent. Reproductive: Status post hysterectomy. No adnexal masses. Other: Bilateral pelvic phleboliths. Small volume pelvic ascites. Hazy inflammatory/edematous changes in the mesenteric fat in the pelvis, more conspicuous left than right. No free air. Musculoskeletal: Lumbar levoscoliosis with multilevel spondylitic change. No fracture or worrisome bone lesion. IMPRESSION: 1. Small volume pelvic ascites with mild diffuse inflammatory/edematous changes in the pelvic mesentery, of indeterminate etiology. 2. No evidence of perforation or abscess. 3. 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 Electronically Signed   By: Lucrezia Europe M.D.   On: 12/10/2020 11:37    Procedures Procedures   Medications Ordered in ED Medications  sodium chloride 0.9 % bolus 500 mL (0 mLs Intravenous Stopped 12/10/20 1230)  metoCLOPramide (REGLAN) injection 10 mg (10 mg Intravenous Given 12/10/20 1041)  pantoprazole (PROTONIX) injection 40 mg (40 mg Intravenous Given 12/10/20 1044)  iohexol (OMNIPAQUE) 300 MG/ML solution 100 mL (100 mLs Intravenous Contrast Given  12/10/20 1053)    ED Course  I have reviewed the triage vital signs and the nursing notes.  Pertinent labs & imaging results that were available during my care of the patient were reviewed by me and considered in my medical decision making (see chart for details). CT scan shows of pelvic ascites with mild diffuse inflammatory changes.  I spoke with gastroenterology and they recommended pelvic ultrasound.  Patient wants to follow-up with GYN first.  She will be placed on protonic and follow-up with GI also follow-up with GYN   MDM Rules/Calculators/A&P                           Patient with persistent nausea.  Patient placed on protonic and will follow up with GI and GYN Final Clinical Impression(s) / ED Diagnoses Final diagnoses:  Nausea    Rx / DC Orders ED Discharge Orders          Ordered    metoCLOPramide (REGLAN) 10 MG tablet  Every 8 hours PRN        12/10/20 1550    pantoprazole (PROTONIX) 20 MG tablet  Daily        12/10/20 1550             Milton Ferguson, MD 12/13/20 [phone]

## 2020-12-25 ENCOUNTER — Ambulatory Visit (INDEPENDENT_AMBULATORY_CARE_PROVIDER_SITE_OTHER): Payer: Medicare Other | Admitting: Nurse Practitioner

## 2020-12-25 ENCOUNTER — Encounter: Payer: Self-pay | Admitting: Nurse Practitioner

## 2020-12-25 VITALS — BP 124/76 | HR 53 | Ht 63.0 in | Wt 120.0 lb

## 2020-12-25 DIAGNOSIS — G8929 Other chronic pain: Secondary | ICD-10-CM

## 2020-12-25 DIAGNOSIS — R1010 Upper abdominal pain, unspecified: Secondary | ICD-10-CM | POA: Diagnosis not present

## 2020-12-25 DIAGNOSIS — R9389 Abnormal findings on diagnostic imaging of other specified body structures: Secondary | ICD-10-CM

## 2020-12-25 MED ORDER — AMITRIPTYLINE HCL 25 MG PO TABS: 25.0000 mg | ORAL_TABLET | Freq: Every day | ORAL | 2 refills | Status: AC

## 2020-12-25 NOTE — Progress Notes (Signed)
Addendum: Reviewed and agree with assessment and management plan. Eliany Mccarter M, MD  

## 2020-12-25 NOTE — Patient Instructions (Addendum)
If you are age 82 or older, your body mass index should be between 23-30. Your Body mass index is 21.26 kg/m. If this is out of the aforementioned range listed, please consider follow up with your Primary Care Provider.  The [facility] providers would like to encourage you to use [facility] to communicate with providers for non-urgent requests or questions.  Due to long hold times on the telephone, sending your provider a message by [facility] may be faster and more efficient way to get a response. Please allow 48 business hours for a response.  Please remember that this is for non-urgent requests/questions.  RECOMMENDATIONS: You can stop the Protonix if you feel it is not helping. Contact Dr. Vena Rua nurse Vaughan Basta in 3 weeks to update [place] on how you are doing.  MEDICATION: We have sent the following medication to your pharmacy for you to pick up at your convenience: Amitriptyline 25 MG, take 1 at bedtime.  It was great seeing you today! Thank you for entrusting me with your care and choosing [facility].  Tye Savoy, NP

## 2020-12-25 NOTE — Progress Notes (Signed)
ASSESSMENT AND PLAN    # 85 yo female with chronic, unexplained epigastric pain.  Unrevealing upper endoscopies and abdominal imaging studies.  No response to PPI.  At the time of her last visit here April 2022 it was our intention to start her on amitriptyline for what was felt to be functional abdominal pain.  It is not clear what happened to this plan.  The medication is not on her home medication list, she has no recollection of ever being started on it.  I reviewed phone notes since her last visit and was unable to find any conversations with the Megan Mercer about the medication ( ? Did she start it, side effects that led to discontinuation?).  --Start amitriptyline 25 mg at bedtime.  Megan Mercer will call in 3 weeks with a condition update and we can decide on upward titration of dose depending how things are going  # Abnormal CTAP w/ contrast >> Small volume pelvic ascites with mild diffuse inflammatory / edematous changes in the pelvic mesentery . This CT scan was done in ED on 10 /19 as part of nausea evaluation. Findings likely incidental and are of unclear significance.  # Known pancreatic cyst , possibly an IPMN. Followed by Dr. Hilarie Fredrickson. 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   HISTORY OF PRESENT ILLNESS    Chief Complaint : abdominal pain   Megan Mercer is a 85 y.o. female known to Dr. 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  She also has a history of chronic dizziness ,  atrial fibrillation, history of IDA, hypothyroidism, pancreatic cyst, kidney stones , cholecystectomy, hysterectomy and appendectomy.  See PMH below for any additional medical problems.    Dr. Hilarie Fredrickson has been following Megan Mercer for abdominal pain and nausea.  He last saw her in April of this year.  Regarding her chronic epigastric pain Megan Mercer has had multiple imaging studies and the pain predates her issues with splenic artery aneurysm / aortic dissection. Pain felt to be functional in nature.  Previous endoscopic evaluations have been unremarkable.  She has not responded to PPI therapy.  At the time of her last she was started on amitriptyline 25 mg at night .   Megan Mercer called the office on the 10th of this month with complaints of recurrent upper abdominal pain and nausea.  We recommended ondansetron and an increase in amitriptyline to 50 mg at bedtime.  She is here for follow-up.   INTERVAL HISTORY: Her intermittent upper abdominal pain has no relation to eating, bowel movements or physical activity. She has frequent nausea. Zofran doesn't help and it makes her dizzy.   She is still having sleeping problems  Megan Mercer tells me she started the Pantoprazole we called in for her.  We had previously documented that Megan Mercer did not respond to PPI therapy.  I reviewed on phone notes, our office did not call in the pantoprazole.  Turns out she was seen in the ED a couple of weeks ago and the ED prescribed.   It appears she is not taking Amitryiptylline.  It is  not on outpatient medication list, she cannot recall anything about the medication. I looked at several phone notes since her last appt here. I was unable to find any documentation about why she may have stopped the medication  She was seen in ED on 10/19 for nausea. Her sodium was 131, glucose 67, renal function normal, lipase normal at 31, liver chemistries normal .  CTAP with contrast showed pelvic ascites and diffuse inflammatory changes in the  pelvic mesentery.  It was recommended that she follow-up with GYN regarding CT scan findings.  Megan Mercer tells me she has no intention of seeing a gynecologist.   CTAP w/ contrast IMPRESSION: 1. Small volume pelvic ascites with mild diffuse inflammatory/edematous changes in the pelvic mesentery, of indeterminate etiology. 2. No evidence of perforation or abscess. 3. 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    Past Medical History:  Diagnosis Date   Anemia    Anxiety    Arthritis    Atrial fibrillation ([place])    Barrett's esophagus    Blood transfusion without reported diagnosis    Chronic paronychia of finger, left    Complication of anesthesia    Dysrhythmia    Fatigue    Ganglion    GERD (gastroesophageal reflux disease)    Glaucoma    Heart disease    Hiatal hernia    HLD (hyperlipidemia)    HTN (hypertension)    Hypothyroidism    Internal hemorrhoids    Iron deficiency anemia    Osteopenia    Osteoporosis    Pancreatic cyst    Peptic ulcer disease with hemorrhage    Plantar fasciitis    PONV (postoperative nausea and vomiting)    Confusion, Combativeness   Rheumatism    UTI (urinary tract infection)     Current Medications, Allergies, Past Surgical History, Family History and Social History were reviewed in [employer] record.   Current Outpatient Medications  Medication Sig Dispense Refill   amiodarone (PACERONE) 200 MG tablet Take 200 mg by mouth daily.     amLODipine (NORVASC) 5 MG tablet Take 5 mg by mouth daily.     apixaban (ELIQUIS) 2.5 MG TABS tablet Take 2.5 mg by mouth 2 (two) times daily.      Calcium Carb-Cholecalciferol (CALCIUM-VITAMIN D) 600-400 MG-UNIT TABS Take 1 tablet by mouth daily.     colestipol (COLESTID) 1 G tablet Take 2 g by mouth 2 (two) times daily.      ezetimibe (ZETIA) 10 MG  tablet Take 10 mg by mouth daily.     latanoprost (XALATAN) 0.005 % ophthalmic solution Place 1 drop into both eyes at bedtime.     levothyroxine (SYNTHROID, LEVOTHROID) 75 MCG tablet Take 75 mcg by mouth daily before breakfast.     metoprolol tartrate (LOPRESSOR) 50 MG tablet Take 50 mg by mouth 2 (two) times daily.     pantoprazole (PROTONIX) 20 MG tablet Take 1 tablet (20 mg total) by mouth daily. 30 tablet 0   propafenone (RYTHMOL) 225 MG tablet Take 1 tablet (225 mg total) by mouth every 8 (eight) hours. 90 tablet 0   raloxifene (EVISTA) 60 MG tablet Take 60 mg by mouth at bedtime.      sertraline (ZOLOFT) 50 MG tablet Take 50 mg by mouth daily.     SYNTHROID 100 MCG tablet Take 100 mcg by mouth daily.  timolol (BETIMOL) 0.5 % ophthalmic solution Place 1 drop into both eyes daily.     traZODone (DESYREL) 100 MG tablet Take 100 mg by mouth at bedtime as needed for sleep.     No current facility-administered medications for this visit.    Review of Systems: No chest pain. No shortness of breath. No urinary complaints.   PHYSICAL EXAM :    Wt Readings from Last 3 Encounters:  12/25/20 120 lb (54.4 kg)  12/10/20 122 lb (55.3 kg)  09/09/20 121 lb (54.9 kg)    BP 124/76   Pulse (!) 53   Ht 5\' 3"  (1.6 m)   Wt 120 lb (54.4 kg)   SpO2 98%   BMI 21.26 kg/m  Constitutional:  Generally well appearing female in no acute distress. Psychiatric: Pleasant. Normal mood and affect. Behavior is normal. EENT: Pupils normal.  Conjunctivae are normal. No scleral icterus. Neck supple.  Cardiovascular: Normal rate, regular rhythm. No edema Pulmonary/chest: Effort normal and breath sounds normal. No wheezing, rales or rhonchi. Abdominal: Soft, nondistended, nontender. Bowel sounds active throughout. There are no masses palpable. No hepatomegaly. Neurological: Alert and oriented to person place and time. Skin: Skin is warm and dry. No rashes noted.  I spent 35 minutes total reviewing records,  obtaining history, performing exam, counseling Megan Mercer and documenting visit / findings.   Tye Savoy, NP  12/25/2020, 11:15 AM

## 2021-03-10 ENCOUNTER — Emergency Department (HOSPITAL_COMMUNITY)
Admission: EM | Admit: 2021-03-10 | Discharge: 2021-03-10 | Discharge status: Against Medical Advice | Payer: Medicare Other

## 2021-03-10 NOTE — ED Notes (Signed)
Pt stated she was leaving ED. Pt ecouraged to stay. Pt  declined.
